# Patient Record
Sex: Female | Born: 1945
Health system: Southern US, Community
[De-identification: ages and names within clinical notes are randomized; demographics above are authoritative.]

## PROBLEM LIST (undated history)

## (undated) DIAGNOSIS — D069 Carcinoma in situ of cervix, unspecified: Secondary | ICD-10-CM

## (undated) DIAGNOSIS — E7800 Pure hypercholesterolemia, unspecified: Secondary | ICD-10-CM

## (undated) DIAGNOSIS — K219 Gastro-esophageal reflux disease without esophagitis: Secondary | ICD-10-CM

## (undated) DIAGNOSIS — N189 Chronic kidney disease, unspecified: Secondary | ICD-10-CM

## (undated) DIAGNOSIS — M1990 Unspecified osteoarthritis, unspecified site: Secondary | ICD-10-CM

## (undated) DIAGNOSIS — I10 Essential (primary) hypertension: Secondary | ICD-10-CM

## (undated) DIAGNOSIS — C50919 Malignant neoplasm of unspecified site of unspecified female breast: Secondary | ICD-10-CM

## (undated) HISTORY — PX: BREAST LUMPECTOMY: SHX2

## (undated) HISTORY — PX: MASTECTOMY: SHX3

## (undated) HISTORY — DX: Carcinoma in situ of cervix, unspecified: D06.9

## (undated) HISTORY — PX: VULVA /PERINEUM BIOPSY: SHX319

## (undated) HISTORY — PX: RENAL BIOPSY: SHX156

## (undated) HISTORY — PX: COLONOSCOPY: SHX174

## (undated) HISTORY — PX: POLYPECTOMY: SHX149

---

## 1997-09-22 ENCOUNTER — Ambulatory Visit (HOSPITAL_COMMUNITY): Admission: RE | Admit: 1997-09-22 | Discharge: 1997-09-22 | Payer: Self-pay | Admitting: Internal Medicine

## 2000-02-08 HISTORY — PX: MASTECTOMY: SHX3

## 2000-04-20 ENCOUNTER — Ambulatory Visit (HOSPITAL_COMMUNITY): Admission: RE | Admit: 2000-04-20 | Discharge: 2000-04-20 | Payer: Self-pay | Admitting: Internal Medicine

## 2000-04-20 ENCOUNTER — Encounter: Payer: Self-pay | Admitting: Internal Medicine

## 2000-04-27 ENCOUNTER — Encounter: Admission: RE | Admit: 2000-04-27 | Discharge: 2000-04-27 | Payer: Self-pay | Admitting: Internal Medicine

## 2000-04-27 ENCOUNTER — Other Ambulatory Visit: Admission: RE | Admit: 2000-04-27 | Discharge: 2000-04-27 | Payer: Self-pay | Admitting: Internal Medicine

## 2000-04-27 ENCOUNTER — Encounter: Payer: Self-pay | Admitting: Internal Medicine

## 2000-04-27 ENCOUNTER — Encounter (INDEPENDENT_AMBULATORY_CARE_PROVIDER_SITE_OTHER): Payer: Self-pay

## 2000-05-16 ENCOUNTER — Encounter (HOSPITAL_BASED_OUTPATIENT_CLINIC_OR_DEPARTMENT_OTHER): Payer: Self-pay | Admitting: General Surgery

## 2000-05-18 ENCOUNTER — Encounter (HOSPITAL_BASED_OUTPATIENT_CLINIC_OR_DEPARTMENT_OTHER): Payer: Self-pay | Admitting: General Surgery

## 2000-05-18 ENCOUNTER — Ambulatory Visit (HOSPITAL_COMMUNITY): Admission: RE | Admit: 2000-05-18 | Discharge: 2000-05-18 | Payer: Self-pay | Admitting: General Surgery

## 2000-05-18 ENCOUNTER — Encounter (INDEPENDENT_AMBULATORY_CARE_PROVIDER_SITE_OTHER): Payer: Self-pay | Admitting: *Deleted

## 2000-07-03 ENCOUNTER — Ambulatory Visit (HOSPITAL_COMMUNITY): Admission: RE | Admit: 2000-07-03 | Discharge: 2000-07-03 | Payer: Self-pay | Admitting: *Deleted

## 2000-07-03 ENCOUNTER — Encounter: Payer: Self-pay | Admitting: *Deleted

## 2000-07-11 ENCOUNTER — Encounter (HOSPITAL_BASED_OUTPATIENT_CLINIC_OR_DEPARTMENT_OTHER): Payer: Self-pay | Admitting: General Surgery

## 2000-07-11 ENCOUNTER — Encounter (INDEPENDENT_AMBULATORY_CARE_PROVIDER_SITE_OTHER): Payer: Self-pay | Admitting: Specialist

## 2000-07-11 ENCOUNTER — Ambulatory Visit (HOSPITAL_COMMUNITY): Admission: RE | Admit: 2000-07-11 | Discharge: 2000-07-12 | Payer: Self-pay | Admitting: General Surgery

## 2000-09-21 ENCOUNTER — Other Ambulatory Visit: Admission: RE | Admit: 2000-09-21 | Discharge: 2000-09-21 | Payer: Self-pay | Admitting: Internal Medicine

## 2000-11-17 ENCOUNTER — Ambulatory Visit (HOSPITAL_COMMUNITY): Admission: RE | Admit: 2000-11-17 | Discharge: 2000-11-17 | Payer: Self-pay | Admitting: General Surgery

## 2001-06-02 ENCOUNTER — Emergency Department (HOSPITAL_COMMUNITY): Admission: EM | Admit: 2001-06-02 | Discharge: 2001-06-02 | Payer: Self-pay | Admitting: Emergency Medicine

## 2001-09-11 ENCOUNTER — Other Ambulatory Visit: Admission: RE | Admit: 2001-09-11 | Discharge: 2001-09-11 | Payer: Self-pay | Admitting: Internal Medicine

## 2001-11-06 ENCOUNTER — Emergency Department (HOSPITAL_COMMUNITY): Admission: EM | Admit: 2001-11-06 | Discharge: 2001-11-06 | Payer: Self-pay | Admitting: Emergency Medicine

## 2001-12-19 ENCOUNTER — Encounter: Payer: Self-pay | Admitting: *Deleted

## 2001-12-19 ENCOUNTER — Ambulatory Visit (HOSPITAL_COMMUNITY): Admission: RE | Admit: 2001-12-19 | Discharge: 2001-12-19 | Payer: Self-pay | Admitting: *Deleted

## 2002-07-11 ENCOUNTER — Encounter: Admission: RE | Admit: 2002-07-11 | Discharge: 2002-07-11 | Payer: Self-pay | Admitting: *Deleted

## 2002-07-11 ENCOUNTER — Encounter: Payer: Self-pay | Admitting: *Deleted

## 2002-07-30 ENCOUNTER — Emergency Department (HOSPITAL_COMMUNITY): Admission: EM | Admit: 2002-07-30 | Discharge: 2002-07-30 | Payer: Self-pay | Admitting: Emergency Medicine

## 2003-01-21 ENCOUNTER — Other Ambulatory Visit: Admission: RE | Admit: 2003-01-21 | Discharge: 2003-01-21 | Payer: Self-pay | Admitting: Internal Medicine

## 2003-02-08 HISTORY — PX: HYSTEROSCOPY WITH D & C: SHX1775

## 2003-02-08 HISTORY — PX: CERVICAL BIOPSY  W/ LOOP ELECTRODE EXCISION: SUR135

## 2003-04-23 ENCOUNTER — Ambulatory Visit (HOSPITAL_COMMUNITY): Admission: RE | Admit: 2003-04-23 | Discharge: 2003-04-23 | Payer: Self-pay | Admitting: Obstetrics and Gynecology

## 2003-04-23 ENCOUNTER — Encounter (INDEPENDENT_AMBULATORY_CARE_PROVIDER_SITE_OTHER): Payer: Self-pay | Admitting: Specialist

## 2003-07-28 ENCOUNTER — Other Ambulatory Visit: Admission: RE | Admit: 2003-07-28 | Discharge: 2003-07-28 | Payer: Self-pay | Admitting: Obstetrics and Gynecology

## 2003-12-30 ENCOUNTER — Ambulatory Visit: Payer: Self-pay | Admitting: Oncology

## 2004-01-03 ENCOUNTER — Emergency Department (HOSPITAL_COMMUNITY): Admission: EM | Admit: 2004-01-03 | Discharge: 2004-01-03 | Payer: Self-pay | Admitting: Family Medicine

## 2004-04-16 ENCOUNTER — Emergency Department (HOSPITAL_COMMUNITY): Admission: EM | Admit: 2004-04-16 | Discharge: 2004-04-16 | Payer: Self-pay | Admitting: Family Medicine

## 2004-06-12 ENCOUNTER — Emergency Department (HOSPITAL_COMMUNITY): Admission: EM | Admit: 2004-06-12 | Discharge: 2004-06-12 | Payer: Self-pay | Admitting: Family Medicine

## 2004-07-07 ENCOUNTER — Ambulatory Visit: Payer: Self-pay | Admitting: Oncology

## 2004-08-06 ENCOUNTER — Encounter: Admission: RE | Admit: 2004-08-06 | Discharge: 2004-08-06 | Payer: Self-pay | Admitting: Oncology

## 2004-08-28 ENCOUNTER — Emergency Department (HOSPITAL_COMMUNITY): Admission: EM | Admit: 2004-08-28 | Discharge: 2004-08-28 | Payer: Self-pay | Admitting: Family Medicine

## 2004-10-16 ENCOUNTER — Inpatient Hospital Stay (HOSPITAL_COMMUNITY): Admission: EM | Admit: 2004-10-16 | Discharge: 2004-10-19 | Payer: Self-pay | Admitting: Emergency Medicine

## 2005-01-06 ENCOUNTER — Ambulatory Visit: Payer: Self-pay | Admitting: Oncology

## 2005-02-04 ENCOUNTER — Encounter: Admission: RE | Admit: 2005-02-04 | Discharge: 2005-02-04 | Payer: Self-pay | Admitting: Nephrology

## 2005-03-07 ENCOUNTER — Ambulatory Visit: Payer: Self-pay | Admitting: Oncology

## 2005-04-21 ENCOUNTER — Ambulatory Visit (HOSPITAL_COMMUNITY): Admission: RE | Admit: 2005-04-21 | Discharge: 2005-04-21 | Payer: Self-pay | Admitting: Nephrology

## 2005-05-11 ENCOUNTER — Ambulatory Visit (HOSPITAL_COMMUNITY): Admission: RE | Admit: 2005-05-11 | Discharge: 2005-05-12 | Payer: Self-pay | Admitting: Nephrology

## 2005-05-11 ENCOUNTER — Encounter (INDEPENDENT_AMBULATORY_CARE_PROVIDER_SITE_OTHER): Payer: Self-pay | Admitting: *Deleted

## 2005-05-27 ENCOUNTER — Emergency Department (HOSPITAL_COMMUNITY): Admission: EM | Admit: 2005-05-27 | Discharge: 2005-05-27 | Payer: Self-pay | Admitting: Family Medicine

## 2005-05-31 ENCOUNTER — Other Ambulatory Visit: Admission: RE | Admit: 2005-05-31 | Discharge: 2005-05-31 | Payer: Self-pay | Admitting: Obstetrics and Gynecology

## 2005-06-20 ENCOUNTER — Ambulatory Visit (HOSPITAL_COMMUNITY): Admission: RE | Admit: 2005-06-20 | Discharge: 2005-06-20 | Payer: Self-pay | Admitting: Oncology

## 2005-08-24 ENCOUNTER — Ambulatory Visit: Payer: Self-pay | Admitting: Oncology

## 2005-08-29 LAB — PROTEIN / CREATININE RATIO, URINE
Creatinine, Urine: 89.4 mg/dL
Protein Creatinine Ratio: 2.14 — ABNORMAL HIGH (ref <0.15)
Total Protein, Urine: 191 mg/dL

## 2005-08-29 LAB — COMPREHENSIVE METABOLIC PANEL
ALT: 9 U/L (ref 0–40)
AST: 15 U/L (ref 0–37)
Albumin: 3.9 g/dL (ref 3.5–5.2)
CO2: 23 mEq/L (ref 19–32)
Calcium: 9.6 mg/dL (ref 8.4–10.5)
Chloride: 106 mEq/L (ref 96–112)
Potassium: 4.5 mEq/L (ref 3.5–5.3)
Sodium: 141 mEq/L (ref 135–145)
Total Protein: 6.3 g/dL (ref 6.0–8.3)

## 2005-08-29 LAB — CBC WITH DIFFERENTIAL/PLATELET
Eosinophils Absolute: 0.1 10*3/uL (ref 0.0–0.5)
LYMPH%: 30 % (ref 14.0–48.0)
MONO#: 0.4 10*3/uL (ref 0.1–0.9)
NEUT#: 3 10*3/uL (ref 1.5–6.5)
Platelets: 301 10*3/uL (ref 145–400)
RBC: 4.58 10*6/uL (ref 3.70–5.32)
RDW: 14.2 % (ref 11.3–14.5)
WBC: 5 10*3/uL (ref 3.9–10.0)
lymph#: 1.5 10*3/uL (ref 0.9–3.3)

## 2005-08-29 LAB — ERYTHROCYTE SEDIMENTATION RATE: Sed Rate: 28 mm/hr (ref 0–30)

## 2005-08-29 LAB — LACTATE DEHYDROGENASE: LDH: 160 U/L (ref 94–250)

## 2005-08-29 LAB — URIC ACID: Uric Acid, Serum: 6.8 mg/dL (ref 2.4–7.0)

## 2005-09-23 ENCOUNTER — Encounter: Admission: RE | Admit: 2005-09-23 | Discharge: 2005-09-23 | Payer: Self-pay | Admitting: Oncology

## 2005-09-23 LAB — LIPID PANEL
LDL Cholesterol: 176 mg/dL — ABNORMAL HIGH (ref 0–99)
Total CHOL/HDL Ratio: 5.2 Ratio

## 2006-02-27 ENCOUNTER — Ambulatory Visit: Payer: Self-pay | Admitting: Oncology

## 2006-03-02 LAB — CBC WITH DIFFERENTIAL/PLATELET
BASO%: 2.8 % — ABNORMAL HIGH (ref 0.0–2.0)
EOS%: 1 % (ref 0.0–7.0)
MCH: 29.8 pg (ref 26.0–34.0)
MCHC: 33.8 g/dL (ref 32.0–36.0)
RDW: 13.4 % (ref 11.3–14.5)
lymph#: 2.1 10*3/uL (ref 0.9–3.3)

## 2006-03-02 LAB — COMPREHENSIVE METABOLIC PANEL
ALT: 8 U/L (ref 0–35)
Albumin: 3.6 g/dL (ref 3.5–5.2)
CO2: 25 mEq/L (ref 19–32)
Calcium: 9 mg/dL (ref 8.4–10.5)
Chloride: 106 mEq/L (ref 96–112)
Glucose, Bld: 96 mg/dL (ref 70–99)
Potassium: 4.3 mEq/L (ref 3.5–5.3)
Sodium: 140 mEq/L (ref 135–145)
Total Bilirubin: 0.3 mg/dL (ref 0.3–1.2)
Total Protein: 5.9 g/dL — ABNORMAL LOW (ref 6.0–8.3)

## 2006-03-02 LAB — LACTATE DEHYDROGENASE: LDH: 154 U/L (ref 94–250)

## 2006-03-02 LAB — CANCER ANTIGEN 27.29: CA 27.29: 9 U/mL (ref 0–39)

## 2006-05-02 ENCOUNTER — Ambulatory Visit: Payer: Self-pay | Admitting: Oncology

## 2006-05-02 LAB — CBC WITH DIFFERENTIAL/PLATELET
BASO%: 0.7 % (ref 0.0–2.0)
Basophils Absolute: 0 10*3/uL (ref 0.0–0.1)
EOS%: 1 % (ref 0.0–7.0)
HGB: 11.5 g/dL — ABNORMAL LOW (ref 11.6–15.9)
MCH: 29.4 pg (ref 26.0–34.0)
MCHC: 34.3 g/dL (ref 32.0–36.0)
MCV: 85.8 fL (ref 81.0–101.0)
MONO%: 7.2 % (ref 0.0–13.0)
RDW: 13.2 % (ref 11.3–14.5)

## 2006-05-02 LAB — MORPHOLOGY

## 2006-08-03 ENCOUNTER — Emergency Department (HOSPITAL_COMMUNITY): Admission: EM | Admit: 2006-08-03 | Discharge: 2006-08-03 | Payer: Self-pay | Admitting: Emergency Medicine

## 2006-08-28 ENCOUNTER — Ambulatory Visit: Payer: Self-pay | Admitting: Oncology

## 2006-08-31 LAB — CBC WITH DIFFERENTIAL/PLATELET
Basophils Absolute: 0 10*3/uL (ref 0.0–0.1)
EOS%: 1.3 % (ref 0.0–7.0)
HCT: 33.7 % — ABNORMAL LOW (ref 34.8–46.6)
HGB: 11.8 g/dL (ref 11.6–15.9)
MCH: 30.1 pg (ref 26.0–34.0)
MCHC: 35.1 g/dL (ref 32.0–36.0)
MCV: 86 fL (ref 81.0–101.0)
MONO%: 7 % (ref 0.0–13.0)
NEUT%: 62.2 % (ref 39.6–76.8)

## 2006-08-31 LAB — COMPREHENSIVE METABOLIC PANEL
AST: 23 U/L (ref 0–37)
Alkaline Phosphatase: 96 U/L (ref 39–117)
BUN: 27 mg/dL — ABNORMAL HIGH (ref 6–23)
Calcium: 9.4 mg/dL (ref 8.4–10.5)
Creatinine, Ser: 2.16 mg/dL — ABNORMAL HIGH (ref 0.40–1.20)
Glucose, Bld: 110 mg/dL — ABNORMAL HIGH (ref 70–99)

## 2006-08-31 LAB — CANCER ANTIGEN 27.29: CA 27.29: 9 U/mL (ref 0–39)

## 2006-09-11 ENCOUNTER — Encounter: Admission: RE | Admit: 2006-09-11 | Discharge: 2006-09-11 | Payer: Self-pay | Admitting: Oncology

## 2006-09-11 LAB — COMPREHENSIVE METABOLIC PANEL
ALT: 11 U/L (ref 0–35)
Albumin: 3.8 g/dL (ref 3.5–5.2)
CO2: 21 mEq/L (ref 19–32)
Calcium: 9.8 mg/dL (ref 8.4–10.5)
Chloride: 109 mEq/L (ref 96–112)
Glucose, Bld: 110 mg/dL — ABNORMAL HIGH (ref 70–99)
Potassium: 4.6 mEq/L (ref 3.5–5.3)
Sodium: 143 mEq/L (ref 135–145)
Total Bilirubin: 0.4 mg/dL (ref 0.3–1.2)
Total Protein: 6.1 g/dL (ref 6.0–8.3)

## 2006-09-11 LAB — LIPID PANEL
Cholesterol: 235 mg/dL — ABNORMAL HIGH (ref 0–200)
VLDL: 40 mg/dL (ref 0–40)

## 2006-09-11 LAB — CBC WITH DIFFERENTIAL/PLATELET
BASO%: 0.5 % (ref 0.0–2.0)
Eosinophils Absolute: 0.1 10*3/uL (ref 0.0–0.5)
LYMPH%: 25.3 % (ref 14.0–48.0)
MONO#: 0.4 10*3/uL (ref 0.1–0.9)
NEUT#: 3.8 10*3/uL (ref 1.5–6.5)
Platelets: 285 10*3/uL (ref 145–400)
RBC: 4.14 10*6/uL (ref 3.70–5.32)
WBC: 5.8 10*3/uL (ref 3.9–10.0)
lymph#: 1.5 10*3/uL (ref 0.9–3.3)

## 2006-09-11 LAB — LACTATE DEHYDROGENASE: LDH: 177 U/L (ref 94–250)

## 2006-11-21 ENCOUNTER — Ambulatory Visit: Payer: Self-pay | Admitting: Oncology

## 2007-01-01 IMAGING — CT CT ABDOMEN W/O CM
1 of 2 series · 14 of 32 positions shown, 18 images · IV contrast (agent unspecified)
Comparison: none

CLINICAL DATA: Bilateral breast cancer diagnosed in 0660.  Chemotherapy completed in 0660.
CHEST CT WITHOUT CONTRAST:
TECHNIQUE: Multidetector CT imaging of the chest was performed following the standard protocol without IV contrast.
No prior CTs.
TECHNIQUE: Multidetector CT imaging of the abdomen was performed following the standard protocol without IV contrast.
TECHNIQUE: Multidetector CT imaging of the pelvis was performed following the standard protocol without IV contrast.

[Series 2: cap 5.0 b30f · axial · 0.68mm/px · z∈[-508,+62]mm · 14 of 127 slices shown, 18 images]
[im 7/127  soft-tissue]
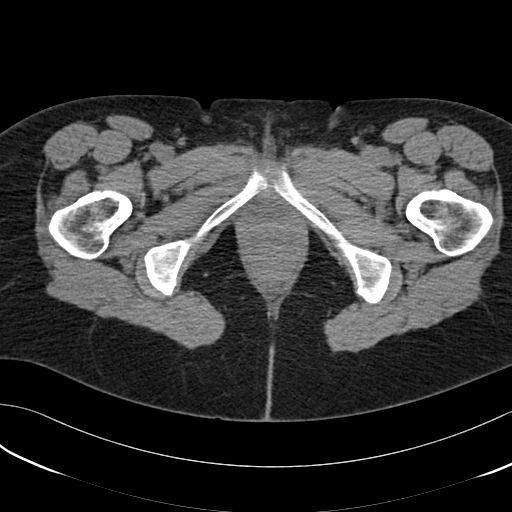
[im 7/127  bone]
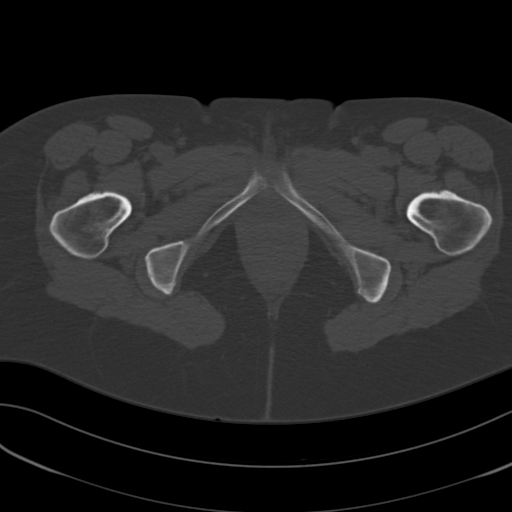
[im 19/127  soft-tissue]
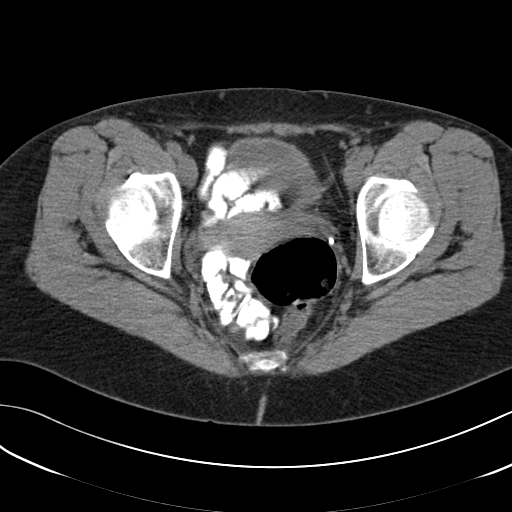
[im 31/127  soft-tissue]
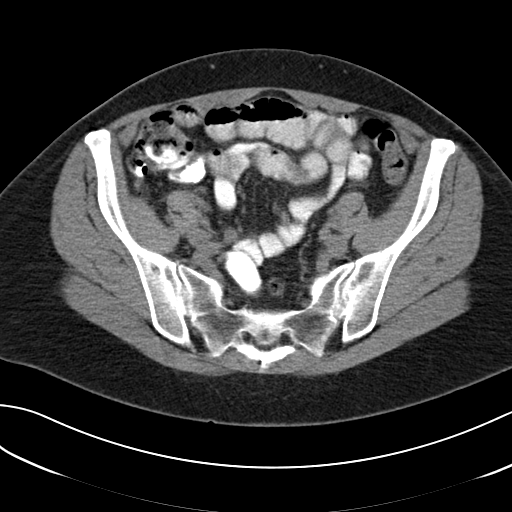
[im 37/127  soft-tissue]
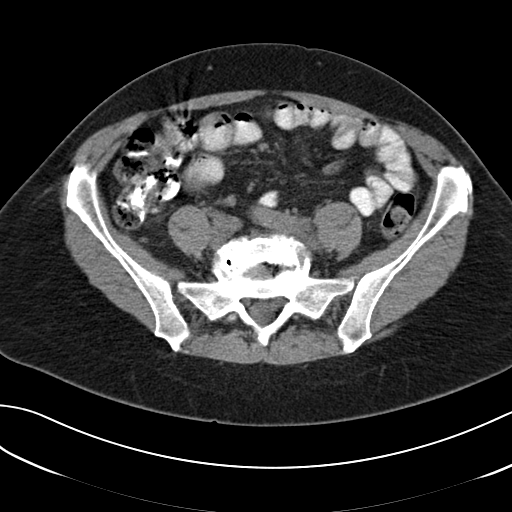
[im 49/127  soft-tissue]
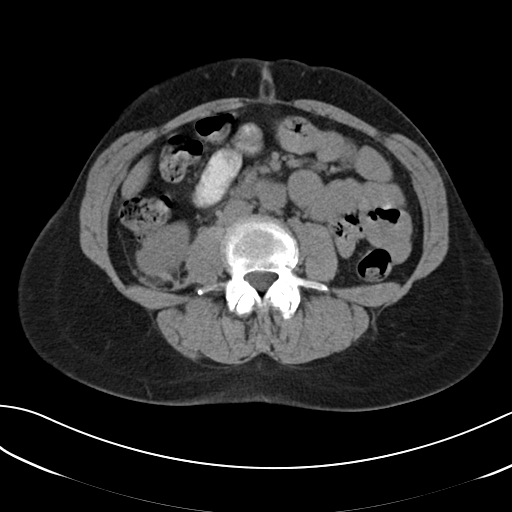
[im 61/127  soft-tissue]
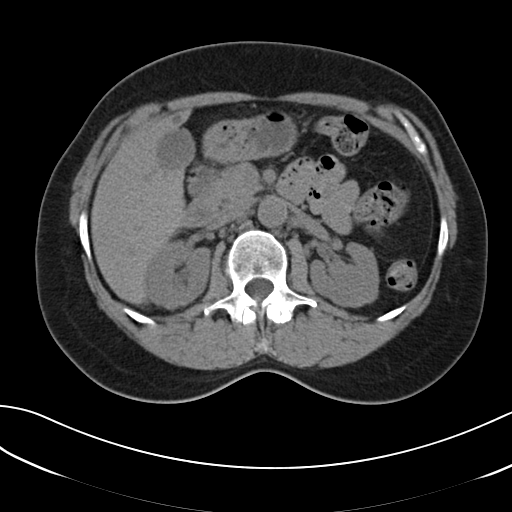
[im 67/127  soft-tissue]
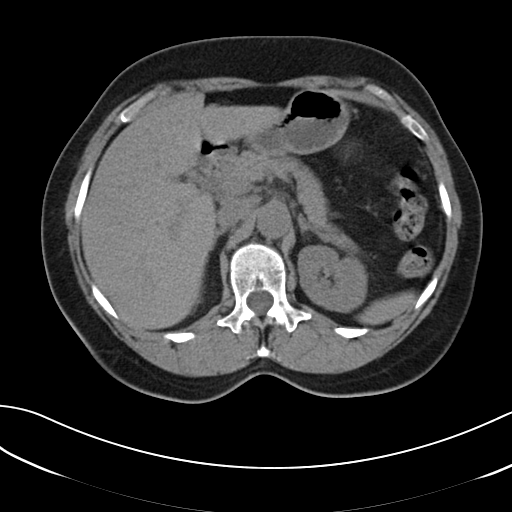
[im 79/127  soft-tissue]
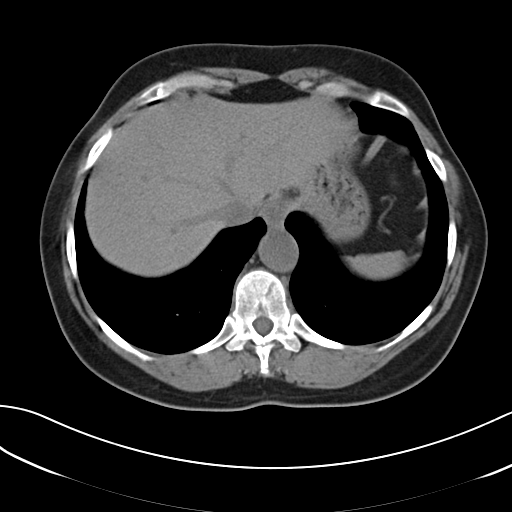
[im 91/127  soft-tissue]
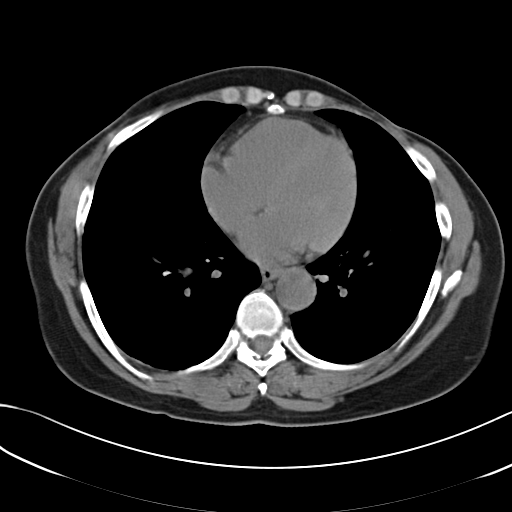
[im 91/127  bone]
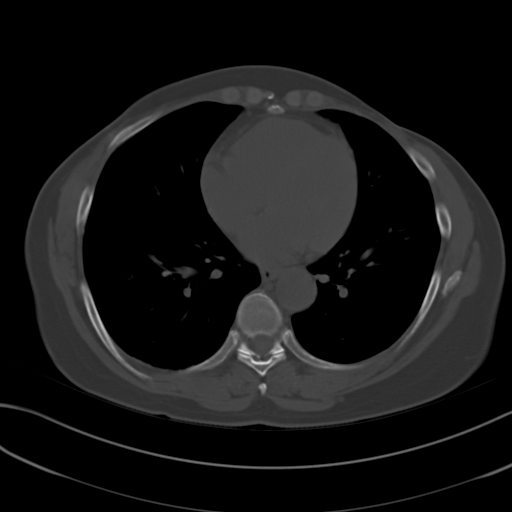
[im 97/127  soft-tissue]
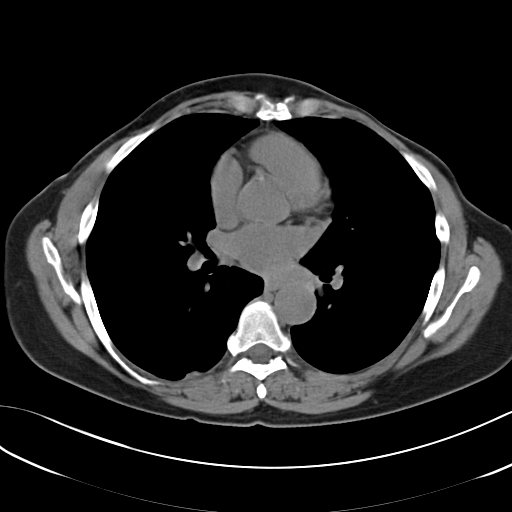
[im 103/127  lung]
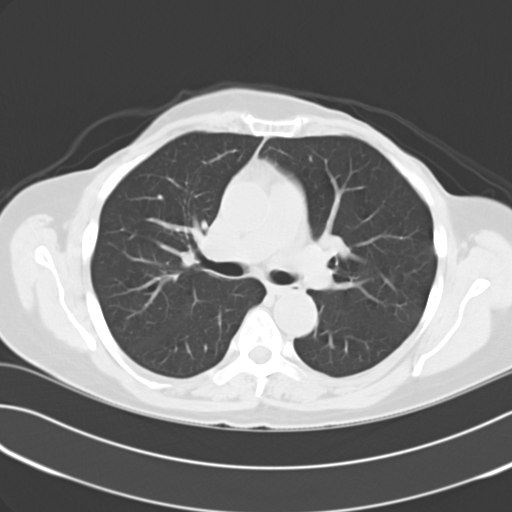
[im 109/127  soft-tissue]
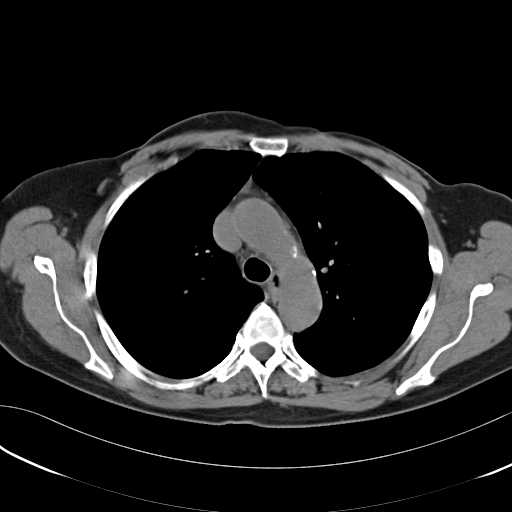
[im 109/127  lung]
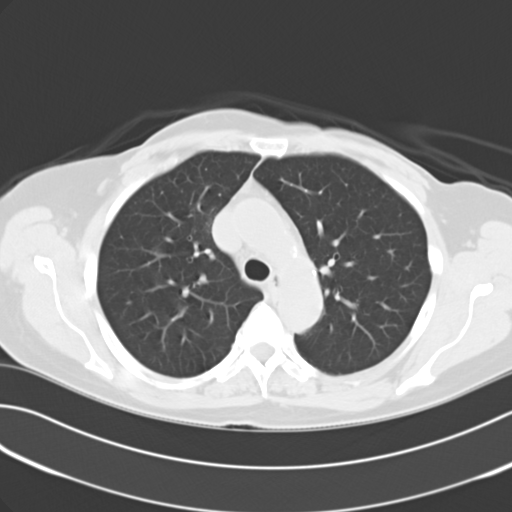
[im 115/127  lung]
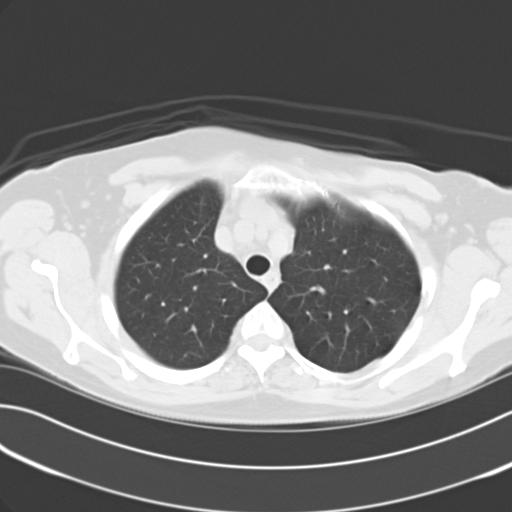
[im 121/127  soft-tissue]
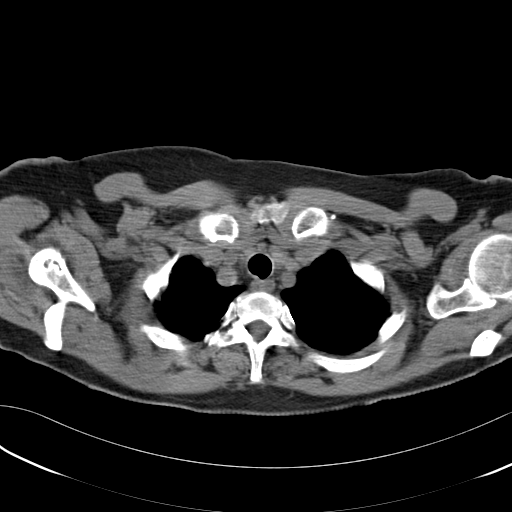
[im 121/127  lung]
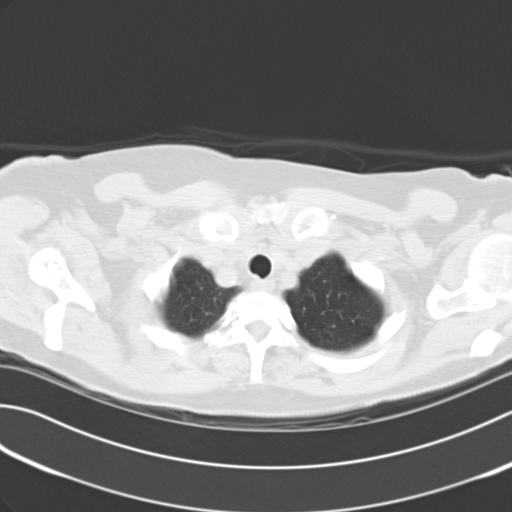

[14 of 32 positions shown; findings below may reference images not displayed]

FINDINGS: Lung windows demonstrates minimal pleural thickening posteriorly on the right.  No nodules.  No airspace disease.  
Soft tissue windows demonstrate postsurgical changes of bilateral mastectomy.  No axillary or internal mammary adenopathy.  Heart size is normal without pericardial or pleural effusion.  No definite mediastinal or hilar adenopathy (given limitations of unenhanced CT).
IMPRESSION: Given limitations of unenhanced CT, no acute process or evidence of metastatic disease within the chest.  
ABDOMEN CT WITHOUT CONTRAST:
FINDINGS: Low-density lesion adjacent to the gallbladder likely represents a small hepatic cyst.  There is a left hepatic dome low-density lesion, which also likely represents a tiny cyst.  Splenule.  Stomach, pancreas, gallbladder, adrenal glands, and left kidney are normal.  There is increased density involving the perirenal fat adjacent to the lower pole right kidney, as well as thickening of the posterior renal fascia in the region (image 81).  This is of indeterminate etiology.  Correlate with prior instrumentation or surgery.  There is an 8 mm retroperitoneal lymph node posterior to the aortic bifurcation.  There are other small retroperitoneal lymph nodes identified.  Small jejunal mesenteric lymph nodes are likely reactive.  The large and small bowel are otherwise normal and there is no ascites.
IMPRESSION: 1.  No evidence of metastatic disease within the abdomen.
2.  Small retroperitoneal lymph nodes are likely reactive but technically indeterminate.  None are pathologic by CT size criteria.  
3.  Prior biopsy or instrumentation to the lower pole right kidney with fascial thickening as described.  
4.  Probable hepatic cysts, limited by lack of intravenous contrast.
PELVIS CT WITHOUT CONTRAST:
FINDINGS: Pelvic large and small bowel are normal.  Urinary bladder and uterus normal for age.  No adnexal mass or significant ascites.  Bone windows demonstrate mild osteopenia.  Likely degenerative cysts at bilateral glenoids.  No worrisome osseous lesion.
IMPRESSION: No acute process in the pelvis or evidence of metastatic disease.

## 2007-02-24 ENCOUNTER — Emergency Department (HOSPITAL_COMMUNITY): Admission: EM | Admit: 2007-02-24 | Discharge: 2007-02-24 | Payer: Self-pay | Admitting: Emergency Medicine

## 2007-02-27 ENCOUNTER — Ambulatory Visit: Payer: Self-pay | Admitting: Oncology

## 2007-03-01 LAB — COMPREHENSIVE METABOLIC PANEL
Albumin: 3.9 g/dL (ref 3.5–5.2)
Alkaline Phosphatase: 126 U/L — ABNORMAL HIGH (ref 39–117)
BUN: 33 mg/dL — ABNORMAL HIGH (ref 6–23)
Calcium: 8.9 mg/dL (ref 8.4–10.5)
Chloride: 108 mEq/L (ref 96–112)
Glucose, Bld: 85 mg/dL (ref 70–99)
Potassium: 4.5 mEq/L (ref 3.5–5.3)
Sodium: 141 mEq/L (ref 135–145)
Total Protein: 6.3 g/dL (ref 6.0–8.3)

## 2007-03-01 LAB — LIPID PANEL
Cholesterol: 237 mg/dL — ABNORMAL HIGH (ref 0–200)
HDL: 58 mg/dL (ref >39)
LDL Cholesterol: 143 mg/dL — ABNORMAL HIGH (ref 0–99)
Triglycerides: 181 mg/dL — ABNORMAL HIGH (ref <150)

## 2007-03-01 LAB — CBC WITH DIFFERENTIAL/PLATELET
BASO%: 0.7 % (ref 0.0–2.0)
EOS%: 1.5 % (ref 0.0–7.0)
HCT: 34.1 % — ABNORMAL LOW (ref 34.8–46.6)
LYMPH%: 29.5 % (ref 14.0–48.0)
MCH: 28.9 pg (ref 26.0–34.0)
MCHC: 33.7 g/dL (ref 32.0–36.0)
MCV: 85.8 fL (ref 81.0–101.0)
MONO%: 6.2 % (ref 0.0–13.0)
NEUT%: 62.1 % (ref 39.6–76.8)
lymph#: 2.2 10*3/uL (ref 0.9–3.3)

## 2007-03-12 ENCOUNTER — Ambulatory Visit (HOSPITAL_COMMUNITY): Admission: RE | Admit: 2007-03-12 | Discharge: 2007-03-12 | Payer: Self-pay | Admitting: Oncology

## 2007-03-14 LAB — VITAMIN D PNL(25-HYDRXY+1,25-DIHY)-BLD
Vit D, 1,25-Dihydroxy: 15 pg/mL (ref 6–62)
Vit D, 25-Hydroxy: 14 ng/mL — ABNORMAL LOW (ref 30–89)

## 2007-07-05 ENCOUNTER — Emergency Department (HOSPITAL_COMMUNITY): Admission: EM | Admit: 2007-07-05 | Discharge: 2007-07-05 | Payer: Self-pay | Admitting: Family Medicine

## 2007-08-27 ENCOUNTER — Ambulatory Visit: Payer: Self-pay | Admitting: Oncology

## 2007-08-30 LAB — CBC WITH DIFFERENTIAL/PLATELET
BASO%: 0.8 % (ref 0.0–2.0)
Eosinophils Absolute: 0.1 10*3/uL (ref 0.0–0.5)
HCT: 36.2 % (ref 34.8–46.6)
MCHC: 34.2 g/dL (ref 32.0–36.0)
MONO#: 0.4 10*3/uL (ref 0.1–0.9)
NEUT#: 4.3 10*3/uL (ref 1.5–6.5)
NEUT%: 63.1 % (ref 39.6–76.8)
RBC: 4.19 10*6/uL (ref 3.70–5.32)
WBC: 6.8 10*3/uL (ref 3.9–10.0)
lymph#: 1.9 10*3/uL (ref 0.9–3.3)

## 2007-08-31 LAB — LIPID PANEL
Cholesterol: 240 mg/dL — ABNORMAL HIGH (ref 0–200)
HDL: 50 mg/dL (ref >39)
Total CHOL/HDL Ratio: 4.8 Ratio

## 2007-08-31 LAB — COMPREHENSIVE METABOLIC PANEL
ALT: 11 U/L (ref 0–35)
AST: 14 U/L (ref 0–37)
Calcium: 9.8 mg/dL (ref 8.4–10.5)
Chloride: 108 mEq/L (ref 96–112)
Creatinine, Ser: 2.1 mg/dL — ABNORMAL HIGH (ref 0.40–1.20)
Potassium: 4.8 mEq/L (ref 3.5–5.3)

## 2007-08-31 LAB — LACTATE DEHYDROGENASE: LDH: 158 U/L (ref 94–250)

## 2007-09-13 ENCOUNTER — Encounter: Admission: RE | Admit: 2007-09-13 | Discharge: 2007-09-13 | Payer: Self-pay | Admitting: Oncology

## 2007-11-23 ENCOUNTER — Ambulatory Visit: Payer: Self-pay | Admitting: Oncology

## 2008-02-22 ENCOUNTER — Ambulatory Visit: Payer: Self-pay | Admitting: Oncology

## 2008-09-01 ENCOUNTER — Ambulatory Visit: Payer: Self-pay | Admitting: Oncology

## 2008-09-04 LAB — CBC WITH DIFFERENTIAL/PLATELET
HCT: 35.9 % (ref 34.8–46.6)
HGB: 12.3 g/dL (ref 11.6–15.9)
MCH: 29.6 pg (ref 25.1–34.0)
MCHC: 34.1 g/dL (ref 31.5–36.0)
MCV: 86.6 fL (ref 79.5–101.0)
MONO%: 6.5 % (ref 0.0–14.0)
NEUT#: 4.3 10*3/uL (ref 1.5–6.5)
Platelets: 267 10*3/uL (ref 145–400)
RBC: 4.15 10*6/uL (ref 3.70–5.45)
RDW: 14.1 % (ref 11.2–14.5)
lymph#: 1.4 10*3/uL (ref 0.9–3.3)

## 2008-09-05 LAB — COMPREHENSIVE METABOLIC PANEL
BUN: 25 mg/dL — ABNORMAL HIGH (ref 6–23)
Calcium: 10.6 mg/dL — ABNORMAL HIGH (ref 8.4–10.5)
Chloride: 107 mEq/L (ref 96–112)
Creatinine, Ser: 1.67 mg/dL — ABNORMAL HIGH (ref 0.40–1.20)
Glucose, Bld: 98 mg/dL (ref 70–99)
Sodium: 140 mEq/L (ref 135–145)

## 2008-09-05 LAB — VITAMIN D 25 HYDROXY (VIT D DEFICIENCY, FRACTURES): Vit D, 25-Hydroxy: 41 ng/mL (ref 30–89)

## 2008-09-15 ENCOUNTER — Encounter: Admission: RE | Admit: 2008-09-15 | Discharge: 2008-09-15 | Payer: Self-pay | Admitting: Oncology

## 2008-12-01 ENCOUNTER — Ambulatory Visit: Payer: Self-pay | Admitting: Oncology

## 2008-12-04 LAB — CBC WITH DIFFERENTIAL/PLATELET
BASO%: 0.6 % (ref 0.0–2.0)
Eosinophils Absolute: 0.1 10*3/uL (ref 0.0–0.5)
HCT: 34.5 % — ABNORMAL LOW (ref 34.8–46.6)
HGB: 11.7 g/dL (ref 11.6–15.9)
LYMPH%: 23.2 % (ref 14.0–49.7)
MCH: 29.9 pg (ref 25.1–34.0)
MCHC: 33.9 g/dL (ref 31.5–36.0)

## 2008-12-05 LAB — COMPREHENSIVE METABOLIC PANEL
ALT: 9 U/L (ref 0–35)
Albumin: 4.1 g/dL (ref 3.5–5.2)
Alkaline Phosphatase: 96 U/L (ref 39–117)
BUN: 32 mg/dL — ABNORMAL HIGH (ref 6–23)
CO2: 22 mEq/L (ref 19–32)
Calcium: 9.9 mg/dL (ref 8.4–10.5)
Chloride: 108 mEq/L (ref 96–112)

## 2008-12-05 LAB — VITAMIN D 25 HYDROXY (VIT D DEFICIENCY, FRACTURES): Vit D, 25-Hydroxy: 33 ng/mL (ref 30–89)

## 2008-12-05 LAB — LACTATE DEHYDROGENASE: LDH: 155 U/L (ref 94–250)

## 2009-04-30 ENCOUNTER — Emergency Department (HOSPITAL_COMMUNITY)
Admission: EM | Admit: 2009-04-30 | Discharge: 2009-04-30 | Payer: Self-pay | Source: Home / Self Care | Admitting: Emergency Medicine

## 2009-07-18 ENCOUNTER — Emergency Department (HOSPITAL_COMMUNITY)
Admission: EM | Admit: 2009-07-18 | Discharge: 2009-07-18 | Payer: Self-pay | Source: Home / Self Care | Admitting: Family Medicine

## 2009-10-30 ENCOUNTER — Ambulatory Visit: Payer: Self-pay | Admitting: Oncology

## 2009-11-03 LAB — COMPREHENSIVE METABOLIC PANEL
AST: 22 U/L (ref 0–37)
Albumin: 3.9 g/dL (ref 3.5–5.2)
Alkaline Phosphatase: 103 U/L (ref 39–117)
CO2: 28 mEq/L (ref 19–32)
Calcium: 9.9 mg/dL (ref 8.4–10.5)
Chloride: 109 mEq/L (ref 96–112)
Creatinine, Ser: 2.19 mg/dL — ABNORMAL HIGH (ref 0.40–1.20)
Glucose, Bld: 98 mg/dL (ref 70–99)
Potassium: 3.8 mEq/L (ref 3.5–5.3)
Total Protein: 7 g/dL (ref 6.0–8.3)

## 2009-11-03 LAB — CBC WITH DIFFERENTIAL/PLATELET
BASO%: 1.8 % (ref 0.0–2.0)
EOS%: 1.8 % (ref 0.0–7.0)
HGB: 11.5 g/dL — ABNORMAL LOW (ref 11.6–15.9)
MCH: 29.8 pg (ref 25.1–34.0)
MCV: 87.5 fL (ref 79.5–101.0)
MONO%: 7.7 % (ref 0.0–14.0)
NEUT#: 4.4 10*3/uL (ref 1.5–6.5)
Platelets: 260 10*3/uL (ref 145–400)
RBC: 3.88 10*6/uL (ref 3.70–5.45)
RDW: 13.9 % (ref 11.2–14.5)

## 2009-11-03 LAB — LACTATE DEHYDROGENASE: LDH: 151 U/L (ref 94–250)

## 2009-12-08 ENCOUNTER — Emergency Department (HOSPITAL_COMMUNITY)
Admission: EM | Admit: 2009-12-08 | Discharge: 2009-12-08 | Payer: Self-pay | Source: Home / Self Care | Admitting: Emergency Medicine

## 2010-02-28 ENCOUNTER — Encounter: Payer: Self-pay | Admitting: Oncology

## 2010-04-20 LAB — WOUND CULTURE

## 2010-05-03 ENCOUNTER — Other Ambulatory Visit: Payer: Self-pay | Admitting: Oncology

## 2010-05-03 DIAGNOSIS — M8580 Other specified disorders of bone density and structure, unspecified site: Secondary | ICD-10-CM

## 2010-06-25 NOTE — Op Note (Signed)
NAME:  Karen Myers, Karen Myers                      ACCOUNT NO.:  [id number]   MEDICAL RECORD NO.:  [id number]                   PATIENT TYPE:  AMB   LOCATION:  [place]                                  FACILITY:  [facility]   PHYSICIAN:  Naima A. Dillard, M.D.              DATE OF BIRTH:  [birth year]   DATE OF PROCEDURE:  04/23/2003  DATE OF DISCHARGE:                                 OPERATIVE REPORT   PREOPERATIVE DIAGNOSIS:  She had an atypical glandular cell abnormality on  Papanicolaou smear and carcinoma in situ on colposcopy.   POSTOPERATIVE DIAGNOSIS:  She had an atypical glandular cell abnormality on  Papanicolaou smear and carcinoma in situ on colposcopy.   PROCEDURE:  Dilatation and curettage, hysteroscopy, and loop electrosurgical  excision procedure.   SURGEON:  Naima A. Charlesetta Garibaldi, M.D.   ANESTHESIA:  General laryngeal mask airway.   ESTIMATED BLOOD LOSS:  Minimal.   FLUIDS REPLACED:  600 mL.   COMPLICATIONS:  None.   FINDINGS:  Atrophic endometrium.  No polyps, fibroids, or abnormalities seen  in the endometrium.  Both ostia were visualized.  A normal-appearing cervix  also.  A LEEP was done and a suture was placed at 12 o'clock.   PROCEDURE IN DETAIL:  The patient went to the operating room, was given  general anesthesia with laryngeal mask airway, and placed in the dorsal  lithotomy position and prepped and draped in a normal sterile fashion.  Bladder was drained.  The patient was examined and found to have anteverted  uterus with no adnexal masses.  Vulvovaginal exam was found to be normal.  A  bivalve speculum was placed into the vagina.  The anterior lip of the cervix  was grasped with a single-tooth tenaculum and dilated with Pratt dilators up  to 21.  The hysteroscope was placed into the uterine cavity.  There was just  atrophic endometrium seen.  Both ostia were visualized and no abnormalities  were seen.  The hysteroscope was removed and endocervical curettage was  done.  Endometrial curetting was done, and endometrial curettings and  endocervical curettings were sent off to pathology.  All instruments were  removed from the vagina.  A coated weighted speculum was then placed into  the vagina and the anterior lip of the cervix was again grasped with a  single-tooth tenaculum.  Lidocaine 10% 1 mL was used for a cervical block  before the hysteroscopy was begun.  Lugol's solution was placed on the  cervix and there were several areas that were highlighted with Lugol's.  The  LEEP was done without difficulty.  The cervical bed was made  hemostatic with Bovie cautery and Monsel's solution.  All instruments were  removed from the vagina.  The tenaculum site was removed with good  hemostasis noted.  Sponge, lap, and needle counts were correct x2.  The  patient tolerated the procedure well.  Naima A. Charlesetta Garibaldi, M.D.    NAD/MEDQ  D:  04/23/2003  T:  04/24/2003  Job:  [id number]

## 2010-06-25 NOTE — H&P (Signed)
NAME:  Karen Myers, Karen Myers                      ACCOUNT NO.:  [id number]   MEDICAL RECORD NO.:  [id number]                   PATIENT TYPE:  AMB   LOCATION:  [place]                                  FACILITY:  [facility]   PHYSICIAN:  Naima A. Dillard, M.D.              DATE OF BIRTH:  [birth year]   DATE OF ADMISSION:  04/22/2003  DATE OF DISCHARGE:                                HISTORY & PHYSICAL   CHIEF COMPLAINT:  Carcinoma in situ.   HISTORY OF PRESENT ILLNESS:  The patient is a 65 year old African-American  female, gravida 2, para 2, who presented to me on February 28, 2003 for  colposcopy secondary to atypical endometrial glandular cell abnormality  present on her Pap smear.  The patient denied having any abnormal Pap smear  or colposcopy before.   PAST MEDICAL HISTORY:  1. Hypertension.  2. Cholesterol.  3. Breast cancer.   OBSTETRICAL HISTORY:  1. Vaginal delivery x2 without any complications.  2. The patient's LMP was years ago.  She is currently menopausal.   GYNECOLOGICAL HISTORY:  She denies having any history of sexually  transmitted diseases.  This was her first abnormal Pap smear and had a  history of an endocervical polyp.   FAMILY HISTORY:  Unremarkable.  No history of GYN cancer, diabetes,  hypertension, or breast cancer.   PAST SURGICAL HISTORY:  Significant for bilateral mastectomy for breast  cancer.  She is currently a two-year survivor and taking Arimidex.  She has  been menopausal for three years.   ALLERGIES:  NO KNOWN DRUG ALLERGIES.   MEDICATIONS:  Cardia, Lisinopril, Lipitor, Arimidex and Os-Cal.   REVIEW OF SYSTEMS:  GENITOURINARY:  Significant for breast cancer and  carcinoma in situ.  Also atypical glandular cell abnormalities found on Pap  smear.  ENDOCRINE:  Significant for hypercholesterolemia.  PSYCHIATRIC:  Unremarkable.  MUSCULOSKELETAL:  Unremarkable.  CARDIOVASCULAR:  Significant  for hypertension.  GI:  Unremarkable.   PHYSICAL EXAMINATION:   VITAL SIGNS:  Weight 163 pounds, blood pressure  128/80.  HEENT:  Pupils are equal.  Hearing is normal.  Throat is clear.  NECK:  Thyroid is not enlarged.  HEART:  Regular rate and rhythm.  LUNGS:  Clear to auscultation bilaterally.  BACK:  No CVA tenderness bilaterally.  ABDOMEN:  Nontender.  EXTREMITIES:  No clubbing, cyanosis or edema bilaterally.  NEUROLOGIC:  Within normal limits.  GENITOURINARY:  Vulva and vaginal examination within normal limits.  Uterus  is top normal size, mobile and nontender.  No adnexal masses.   LABORATORY DATA:  On colposcopy biopsies at 5 o'clock were significant  squamous cell carcinoma in situ.  Biopsy at 3 o'clock on the cervix was  significant for moderate dysplasia.  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  Endocervical  curettage was benign endocervix and detached squamous fragments, with  carcinoma in situ CIN-3.  Endometrial biopsy  was significant for scant  atrophic to weakly proliferative endometrium and detached squamous fragments  of CIN-3.   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  Normal ECC  endometrial biopsy, with atypical endometrial glandular cell abnormality  found on Pap smear.  I plan to do a LEEP excision of the cervix to further  differentiate and hopefully treat the CIN-3.  The patient understands the risks of, but not limited to:  bleeding,  infection, damage to cervix; also retained abnormal cells.  I plan to do a  D&C hysteroscopy to evaluate to the endometrium.  Even though her  endometrial biopsy was found to be within normal limits, because she has  some atypical endometrial cells on the Pap smear I find it better to  investigate.  She understands the risks of, but not limited to:  bleeding,  infection, perforation of the uterus (which can lead to other surgeries).  The patient agrees with the procedure.                                                Naima A. Charlesetta Garibaldi, M.D.    NAD/MEDQ  D:  04/22/2003  T:  04/22/2003  Job:  [id number]

## 2010-06-25 NOTE — Op Note (Signed)
. [facility]  [name], Karen Myers                   MRN: [id number] Proc. Date: 07/11/00 Adm. Date:  [id number] Attending:  Sherolyn Buba CC:         Dr.Smith, Oncology   Operative Report  PREOPERATIVE DIAGNOSIS: 1. Infiltrating lobular carcinoma of the left breast. 2. Poor venous access.  POSTOPERATIVE DIAGNOSIS: 1. Infiltrating lobular carcinoma of the left breast. 2. Poor venous access.  OPERATION PERFORMED:  Bilateral total mastectomies and Port-A-Cath implantation.  SURGEON:  Candee Furbish. Bubba Camp, M.D.  ASSISTANT:  Maia Plan. Lindon Romp, M.D.  ANESTHESIA:  General.  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  She having had all of her risks and benefits described to her in detail, she has opted for bilateral mastectomy, given the large recurrence and bilaterality of her tumor.  It should be noted that on excision of the left lumpectomy, there was florid lobular carcinoma in situ at the margins.  She is also being staged for chemotherapy and it is also planned to implant a Port-A-Cath device so as to facilitate her chemotherapy at that time.  DESCRIPTION OF PROCEDURE:  Following the induction of satisfactory general anesthesia, the patient was positioned supinely, the breasts and anterior chest and neck were prepped and draped to be included in a sterile operative field.  I started on the left side making an elliptical incision around the old biopsy site inclusive of the nipple, deepened this through the skin and subcutaneous tissues and raising a superior and medial flap to the clavicle and inferolateral flap to the rectus and to the latissimus dorsi.  I then dissected the breast tissue away from the anterior chest wall taking along the anterior pectoralis fascia  dissecting laterally out to the latissimus dorsi, thereby removing the left breast.  Hemostasis was obtained with electrocautery and this wound was packed with saline soaked gauze.  Attention was then turned to the right side where similarly, I made a symmetrically placed elliptical incision around the breast tissue deepened this through the skin and subcutaneous tissues and raising a superomedial flap to the sternal border and the clavicle, inferolateral flap to the rectus and to the latissimus dorsi.  I then dissected the breast tissue away from the pectoralis muscle along with the anterior pectoralis fascia removing the breast in its entirety and forwarding the right breast for pathologic evaluation.  We then placed the patient in Trendelenburg.  Head and neck turned to the left.  I made a subclavian stick into the left subclavian vein and threaded the guide wire under fluoroscopic guidance into the right atrium. I then created a tunnel from underneath the breast flap up through the tissues up to the shoulder and pulled through a Silastic catheter and Port-A-Cath. The Port-A-Cath and Silastic catheter was then flushed.  I used a size 10 [place] introducer and dilator over the guide wire and put it in the central venous system under fluoroscopic guidance.  The dilator and guide wire were removed and the Silastic catheter threaded into the central venous system and positioned at the atriovena caval junction.  The peel-away inserter was then removed.  Inflow of heparinized saline and backflow of blood were noted to be excellent.  The external portion of the catheter was then trimmed and attached to the  Port-A-Cath reservoir.  The Port-A-Cath reservoir was sutured onto the pectoralis major muscle.  Final fluoroscopic evaluation of the Port-A-Cath placement showed that the Port-A-Cath was well seated in the muscle.  There were no kinks, bends or unusual turns in the course of the Silastic  catheter and the tip of the catheter was at the atrial and vena caval junction. Sponge, instrument and sharp counts were verified. The wound was closed in layers as follows:  The subcutaneous tissues of both breast wounds was closed with interrupted 2-0 Vicryl sutures.  I created a small pocket around the Port-A-Cath with 2-0 Vicryl sutures.  I then placed a 10 mm Blake drain in both wounds and secured it to the skin with 2-0 Vicryl sutures.  The skin incision was then closed with a running suture of 4-0 Monocryl, then reinforced with Steri-Strips and sterile dressings applied.  Anesthetic reversed.  Patient removed from the operating room to the recovery room in stable condition having tolerated the procedure well. DD:  07/11/00 TD:  07/11/00 Job: [id number]

## 2010-06-25 NOTE — Op Note (Signed)
[facility]  [name], Karen Myers                   MRN: [id number] Proc. Date: 05/18/00 Adm. Date:  [id number] Attending:  Sherolyn Buba                           Operative Report  PREOPERATIVE DIAGNOSIS:  Carcinoma left breast.  POSTOPERATIVE DIAGNOSIS:  Carcinoma left breast.  PROCEDURE:  Partial mastectomy and sentinel lymph node biopsy left side.  SURGEON:  Candee Furbish. Bubba Camp, M.D.  ASSISTANT:  Nurse.  ANESTHESIA:  General.  INDICATIONS:  [name] is a 65 year old woman presenting with an abnormal mammogram showing a lesion at the areolar border, which on core biopsy showed an invasive lobular carcinoma.  She was brought to the operating room for lumpectomy with sentinel lymph node biopsy.  DESCRIPTION OF PROCEDURE:  Following the induction of anesthesia, the patient was positioned supinely, and the right breast and axilla prepped and draped routinely.  The localizing needle placed into the lesion was placed somewhat at the 9 oclock position and identifying the lesion at about the 12:30 position.  I made a circumareolar incision and deepened this through the skin and subcutaneous tissue dissecting superior and lateral flaps, inferior and medial flaps so as to get around the tissues.  Breast tissue was taken down on all sides of the tumor to the chest wall and the entire mass was removed and forwarded for pathologic evaluation.  Touch preps were negative for tumor. Using the neoprobe, I focused on an area of increased radioactivity in the axilla.  I made a transverse axillary incision, deepened this through the skin and subcutaneous tissue and carrying the dissection down to the axillary node. The node was dyed blue from the Lymphazurin that was injected immediately before surgery.  This was forwarded for pathologic evaluation and was negative on touch prep.  All areas of dissection were then checked for  hemostasis. Additional bleeding points were treated with electrocautery.  Sponge, instrument and sharp counts were verified.  Both wounds were closed as follows: subcutaneous tissues closed with interrupted 3-0 Vicryl sutures, skin closed with 4-0 Monocryl, both in the axilla and on the breast.  Sterile dressings were then applied.  Anesthetic reversed and patient removed from the operating room to the recovery room in stable condition.  She tolerated the procedure well. DD:  05/18/00 TD:  05/18/00 Job: [id number]

## 2010-06-25 NOTE — Discharge Summary (Signed)
NAME:  LAREN, LAWRIE NO.:  [id number]   MEDICAL RECORD NO.:  [id number]          PATIENT TYPE:  INP   LOCATION:  [id number]                         FACILITY:  [facility]   PHYSICIAN:  Jeanann Lewandowsky, M.D.    DATE OF BIRTH:  02/09/1945   DATE OF ADMISSION:  10/16/2004  DATE OF DISCHARGE:  10/19/2004                                 DISCHARGE SUMMARY   DISCHARGE DIAGNOSES:  1.  Dehydration.  2.  Gastroenteritis.  3.  Renal insufficiency.  4.  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  5.  Systemic hypertension.  6.  Status post mastectomy for carcinoma of the breast and prophylaxis.   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  The patient's past medical history is remarkable for the absence  of chronic gastrointestinal problems.  She specifically denies any  hematemesis, melena, hematochezia, and no prior history of peptic ulcer  disease or inflammatory bowel disease.   PERTINENT PHYSICAL FINDINGS:  GENERAL APPEARANCE:  She is a well-developed,  well-nourished African American woman appearing somewhat weak and a bit  __________.  Respirations were unlabored.  VITAL SIGNS:  Blood pressure on admission was 91/66 in the sitting position  and 112/56 lying.  Pulse goes up from 83 to 104 sitting with her legs  dangling.  Her respiratory rate is unlabored at 16-20, and her temperature  is 97.6.  SKIN:  Cool and dry.  No rashes are noted.  HEENT:  Head is atraumatic and normocephalic.  Sclerae anicteric.  No  conjunctival pallor.  NECK:  Supple.  No adenopathy, thyromegaly or jugular venous distention.  CHEST:  She has bilateral mastectomy.  There is no evidence of any local  recurrence.  Axilla is without any adenopathy.  LUNGS:  Clear to percussion and auscultation.  CARDIAC:      Precordium was 2+ dynamic with normal heart sounds.  No  murmurs, rubs, gallops, heaves or thrills.  Bowel sounds are increased.  No  rushes are noted.  EXTREMITIES:  No cyanosis, clubbing or edema.  No calf tenderness.  Negative  Homans sign.  BACK:  Without any CVA tenderness.  NEUROLOGICAL:  Alert and oriented, cooperative, without any focal sensory,  motor or reflex deficit.   PERTINENT LABORATORY DATA:  White count is 5400, hematocrit 35, hemoglobin  12.  Metabolic panel:  Sodium 0000000, potassium 3.5, chloride 105, CO2 24,  glucose 105, BUN 44, creatinine 3.8, calcium 8.7.  OT/PT are normal as well  as alk-phos.  Urine is yellow and clear.  Specific gravity of 1.018.  Negative nitrites, leukocyte esterase, protein, ketones, bilirubin.  She had  a small amount of hemoglobin and glucose is negative.  EKG reveals a normal  sinus rhythm.  There is left axis deviation.  No acute ST-T wave changes,  and otherwise, normal tracing.   HOSPITAL COURSE:  The patient is admitted clinically dehydrated with  relative hypotension and renal insufficiency.  It is presumed that she has  acute and subacute gastroenteritis producing loss of fluid volume,  superimposed on the chronic use of hydrochlorothiazide which she uses for  control of her blood pressure.  The patient was started on parenteral IV  replacement therapy with potassium chloride.  Initially, she was started on  clear liquids and progressively was taken to a regular diet without any  difficulties.  She had no significant abdominal pain while here in the  hospital.  With hydration, her BUN and creatinine declined aggressively with  the creatinine being 1.8, three days post admission.  Her potassium did  regain nadir of 3.2, but prior to her discharge was back up to 3.6.   Because of persistent complaints of pain in the right shoulder, a plain x-  ray was obtained and did reveal some cystic lucencies in the glenoid area  with surrounding  sclerotic margins suggesting subchondral cysts.  No acute  pathology was noted.  The patient was not treated with any antiinflammatory  agents during the hospitalization in view of her gastroenteritis, but was  treated p.r.n. for the right shoulder pain.  No other problems were noted.  The patient was continued off of her hydrochlorothiazide and was instructed  to do so until seen in the office.  She is to resume her Lipitor 20 mg  daily, Cardizem 240 mg daily, and her Arimidex at 1 mg per day.  She is  asked to hold the Benicar and HCT.  She is discharged home on a 3 g sodium,  modified fat restricted diet.  She is to be seen in the office in 2-3 weeks.           ______________________________  Jeanann Lewandowsky, M.D.     PC/MEDQ  D:  12/16/2004  T:  12/16/2004  Job:  [id number]

## 2010-09-17 ENCOUNTER — Other Ambulatory Visit: Payer: Self-pay

## 2010-10-20 ENCOUNTER — Ambulatory Visit
Admission: RE | Admit: 2010-10-20 | Discharge: 2010-10-20 | Disposition: A | Discharge status: Home / Self Care | Payer: BC Managed Care – PPO | Source: Ambulatory Visit | Attending: Oncology | Admitting: Oncology

## 2010-10-20 DIAGNOSIS — M8580 Other specified disorders of bone density and structure, unspecified site: Secondary | ICD-10-CM

## 2010-11-04 ENCOUNTER — Other Ambulatory Visit: Payer: Self-pay | Admitting: Oncology

## 2010-11-04 ENCOUNTER — Encounter (HOSPITAL_BASED_OUTPATIENT_CLINIC_OR_DEPARTMENT_OTHER): Payer: BC Managed Care – PPO | Admitting: Oncology

## 2010-11-04 DIAGNOSIS — Z170 Estrogen receptor positive status [ER+]: Secondary | ICD-10-CM

## 2010-11-04 DIAGNOSIS — C50419 Malignant neoplasm of upper-outer quadrant of unspecified female breast: Secondary | ICD-10-CM

## 2010-11-04 DIAGNOSIS — N183 Chronic kidney disease, stage 3 unspecified: Secondary | ICD-10-CM

## 2010-11-04 LAB — CBC WITH DIFFERENTIAL/PLATELET
BASO%: 0.5 % (ref 0.0–2.0)
Basophils Absolute: 0 10*3/uL (ref 0.0–0.1)
EOS%: 1.4 % (ref 0.0–7.0)
HCT: 37 % (ref 34.8–46.6)
HGB: 12.6 g/dL (ref 11.6–15.9)
MCH: 29.8 pg (ref 25.1–34.0)
MCHC: 34 g/dL (ref 31.5–36.0)
MCV: 87.8 fL (ref 79.5–101.0)
MONO%: 6.4 % (ref 0.0–14.0)
NEUT%: 69 % (ref 38.4–76.8)
RDW: 13.9 % (ref 11.2–14.5)
lymph#: 1.5 10*3/uL (ref 0.9–3.3)

## 2010-11-05 LAB — COMPREHENSIVE METABOLIC PANEL
ALT: 9 U/L (ref 0–35)
CO2: 23 mEq/L (ref 19–32)
Sodium: 142 mEq/L (ref 135–145)
Total Bilirubin: 0.5 mg/dL (ref 0.3–1.2)
Total Protein: 6.6 g/dL (ref 6.0–8.3)

## 2010-11-05 LAB — LIPID PANEL
HDL: 43 mg/dL (ref >39)
LDL Cholesterol: 119 mg/dL — ABNORMAL HIGH (ref 0–99)
Total CHOL/HDL Ratio: 4.6 Ratio
VLDL: 34 mg/dL (ref 0–40)

## 2010-11-05 LAB — PTH, INTACT AND CALCIUM: PTH: 156.5 pg/mL — ABNORMAL HIGH (ref 14.0–72.0)

## 2010-11-05 LAB — CANCER ANTIGEN 27.29: CA 27.29: 13 U/mL (ref 0–39)

## 2010-11-09 ENCOUNTER — Encounter: Payer: Self-pay | Admitting: *Deleted

## 2010-11-19 ENCOUNTER — Encounter (HOSPITAL_BASED_OUTPATIENT_CLINIC_OR_DEPARTMENT_OTHER): Payer: BC Managed Care – PPO | Admitting: Oncology

## 2010-11-19 DIAGNOSIS — N289 Disorder of kidney and ureter, unspecified: Secondary | ICD-10-CM

## 2010-11-19 DIAGNOSIS — C50419 Malignant neoplasm of upper-outer quadrant of unspecified female breast: Secondary | ICD-10-CM

## 2010-11-19 DIAGNOSIS — Z170 Estrogen receptor positive status [ER+]: Secondary | ICD-10-CM

## 2010-12-01 ENCOUNTER — Encounter: Payer: Self-pay | Admitting: Oncology

## 2010-12-01 ENCOUNTER — Telehealth: Payer: Self-pay | Admitting: Oncology

## 2010-12-01 NOTE — Telephone Encounter (Signed)
Mailed appts for 05/13/11 lab @ 3.15p and 05/20/11 md appt @ 3p.

## 2010-12-02 ENCOUNTER — Encounter: Payer: Self-pay | Admitting: *Deleted

## 2010-12-02 DIAGNOSIS — N289 Disorder of kidney and ureter, unspecified: Secondary | ICD-10-CM

## 2010-12-02 DIAGNOSIS — E785 Hyperlipidemia, unspecified: Secondary | ICD-10-CM | POA: Insufficient documentation

## 2010-12-02 DIAGNOSIS — C50919 Malignant neoplasm of unspecified site of unspecified female breast: Secondary | ICD-10-CM | POA: Insufficient documentation

## 2010-12-02 DIAGNOSIS — I10 Essential (primary) hypertension: Secondary | ICD-10-CM

## 2010-12-10 ENCOUNTER — Other Ambulatory Visit (HOSPITAL_COMMUNITY): Payer: Self-pay | Admitting: Nephrology

## 2010-12-21 ENCOUNTER — Ambulatory Visit (HOSPITAL_COMMUNITY): Payer: BC Managed Care – PPO

## 2010-12-21 ENCOUNTER — Encounter (HOSPITAL_COMMUNITY)
Admission: RE | Admit: 2010-12-21 | Discharge: 2010-12-21 | Disposition: A | Discharge status: Home / Self Care | Payer: BC Managed Care – PPO | Source: Ambulatory Visit | Attending: Nephrology | Admitting: Nephrology

## 2010-12-21 MED ORDER — TECHNETIUM TC 99M SESTAMIBI - CARDIOLITE
25.0000 | Freq: Once | INTRAVENOUS | Status: AC | PRN
  Administered 2010-12-21: 13:00:00 25 via INTRAVENOUS

## 2010-12-22 ENCOUNTER — Telehealth: Payer: Self-pay | Admitting: *Deleted

## 2010-12-22 NOTE — Telephone Encounter (Signed)
Mailed out letter and calendar to inform the patient of the new date and time

## 2011-04-26 ENCOUNTER — Other Ambulatory Visit: Payer: Self-pay | Admitting: Gastroenterology

## 2011-05-13 ENCOUNTER — Other Ambulatory Visit (HOSPITAL_BASED_OUTPATIENT_CLINIC_OR_DEPARTMENT_OTHER): Payer: BC Managed Care – PPO | Admitting: Lab

## 2011-05-13 ENCOUNTER — Other Ambulatory Visit: Payer: BC Managed Care – PPO | Admitting: Lab

## 2011-05-13 DIAGNOSIS — N289 Disorder of kidney and ureter, unspecified: Secondary | ICD-10-CM

## 2011-05-13 DIAGNOSIS — Z170 Estrogen receptor positive status [ER+]: Secondary | ICD-10-CM

## 2011-05-13 DIAGNOSIS — C50419 Malignant neoplasm of upper-outer quadrant of unspecified female breast: Secondary | ICD-10-CM

## 2011-05-13 LAB — CBC WITH DIFFERENTIAL/PLATELET
BASO%: 0.9 % (ref 0.0–2.0)
EOS%: 0.8 % (ref 0.0–7.0)
HCT: 37.6 % (ref 34.8–46.6)
LYMPH%: 25 % (ref 14.0–49.7)
MCH: 28.5 pg (ref 25.1–34.0)
MCHC: 32.9 g/dL (ref 31.5–36.0)
NEUT%: 68 % (ref 38.4–76.8)
RBC: 4.34 10*6/uL (ref 3.70–5.45)
WBC: 8.5 10*3/uL (ref 3.9–10.3)
lymph#: 2.1 10*3/uL (ref 0.9–3.3)
nRBC: 0 % (ref 0–0)

## 2011-05-16 LAB — COMPREHENSIVE METABOLIC PANEL
ALT: 14 U/L (ref 0–35)
AST: 20 U/L (ref 0–37)
Alkaline Phosphatase: 153 U/L — ABNORMAL HIGH (ref 39–117)
BUN: 23 mg/dL (ref 6–23)
Creatinine, Ser: 1.92 mg/dL — ABNORMAL HIGH (ref 0.50–1.10)
Potassium: 4.2 mEq/L (ref 3.5–5.3)

## 2011-05-20 ENCOUNTER — Ambulatory Visit (HOSPITAL_BASED_OUTPATIENT_CLINIC_OR_DEPARTMENT_OTHER): Payer: BC Managed Care – PPO | Admitting: Oncology

## 2011-05-20 ENCOUNTER — Encounter: Payer: Self-pay | Admitting: *Deleted

## 2011-05-20 VITALS — BP 133/77 | HR 88 | Temp 98.6°F | Ht 67.0 in | Wt 182.8 lb

## 2011-05-20 DIAGNOSIS — Z170 Estrogen receptor positive status [ER+]: Secondary | ICD-10-CM

## 2011-05-20 DIAGNOSIS — C50919 Malignant neoplasm of unspecified site of unspecified female breast: Secondary | ICD-10-CM

## 2011-05-20 DIAGNOSIS — N289 Disorder of kidney and ureter, unspecified: Secondary | ICD-10-CM

## 2011-05-20 DIAGNOSIS — C50419 Malignant neoplasm of upper-outer quadrant of unspecified female breast: Secondary | ICD-10-CM

## 2011-05-20 NOTE — Progress Notes (Signed)
05/20/11 @ 3:00pm, NSABP B-42, Final AE Assessment:  Karen Myers into the [facility] for a visit with Dr. Truddie Coco to assess adverse events per the B-42 study.  She completed study drug in October, 2012.  Spoke with her beforehand and she denied any bone fractures, arterial clots, new lipid lowering medication or cardiac abnormalities.  Thanked her for her dedication to the study and advised her that once the data analysis is complete, she will receive a patient letter about the outcome of the study.  She will have her future follow up assessments with her PCP, since she is over 10 out from diagnosis of breast cancer.

## 2011-05-22 NOTE — Progress Notes (Signed)
Hematology and Oncology Follow Up Visit  Karen Myers [id number] 16-Oct-1945 66 y.o. 05/22/2011 11:18 PM PCP Karen Myers,   1. Principle Diagnosis: T1c N0 breast cancer status post 4 cycles of AC chemotherapy completed 10/01/2000, on Arimidex times 5 years status post bilateral mastectomies, status post completion of B42 study, October 2012. History of renal insufficiency with diagnosed membranous nephropathy  Interim History:  There have been no intercurrent illness, hospitalizations or medication changes.  Medications: I have reviewed the patient's current medications.  Allergies: No Known Allergies  Past Medical History, Surgical history, Social history, and Family History were reviewed and updated.  Review of Systems: Constitutional:  Negative for fever, chills, night sweats, anorexia, weight loss, pain. Cardiovascular: no chest pain or dyspnea on exertion Respiratory: no cough, shortness of breath, or wheezing Neurological: negative Dermatological: negative ENT: negative Skin Gastrointestinal: no abdominal pain, change in bowel habits, or black or bloody stools Genito-Urinary: negative Hematological and Lymphatic: negative Breast: negative Musculoskeletal: negative Remaining ROS negative.  Physical Exam: Blood pressure 133/77, pulse 88, temperature 98.6 F (37 C), temperature source Oral, height 5\' 7"  (1.702 m), weight 182 lb 12.8 oz (82.918 kg). ECOG:  General appearance: alert, cooperative and appears stated age Head: Normocephalic, without obvious abnormality, atraumatic Neck: no adenopathy, no carotid bruit, no JVD, supple, symmetrical, trachea midline and thyroid not enlarged, symmetric, no tenderness/mass/nodules Lymph nodes: Cervical, supraclavicular, and axillary nodes normal. Cardiac : regular rate and rhythm, no murmurs or gallops Pulmonary:clear to auscultation bilaterally and normal percussion bilaterally Breasts: inspection negative, s/p bilateral  mastectomy , w/out evidence of local recurrence. Abdomen:soft, non-tender; bowel sounds normal; no masses,  no organomegaly Extremities negative Neuro: alert, oriented, normal speech, no focal findings or movement disorder noted  Lab Results: Lab Results  Component Value Date   WBC 8.5 05/13/2011   HGB 12.4 05/13/2011   HCT 37.6 05/13/2011   MCV 86.7 05/13/2011   PLT 284 05/13/2011     Chemistry      Component Value Date/Time   NA 143 05/13/2011 0736   K 4.2 05/13/2011 0736   CL 107 05/13/2011 0736   CO2 25 05/13/2011 0736   BUN 23 05/13/2011 0736   CREATININE 1.92* 05/13/2011 0736      Component Value Date/Time   CALCIUM 10.1 05/13/2011 0736   CALCIUM 10.4 11/04/2010 0817   ALKPHOS 153* 05/13/2011 0736   AST 20 05/13/2011 0736   ALT 14 05/13/2011 0736   BILITOT 0.3 05/13/2011 0736      .pathology. Radiological Studies: chest X-ray n/a Mammogram n/a Bone density n/a  Impression and Plan: Karen Myers is doing well, and is well over 10 yrs from diagnosis. I have recommended that she be d/ced from out practice and be followed by her primary care doctor, i will be available to her should the need arise. Her Cr is 1.92 and she may need further f/u with Karen Myers.  More than 50% of the visit was spent in patient-related counselling   Karen Esters, MD 4/14/201311:18 PM

## 2011-06-12 ENCOUNTER — Encounter (HOSPITAL_COMMUNITY): Payer: Self-pay | Admitting: *Deleted

## 2011-06-12 ENCOUNTER — Emergency Department (INDEPENDENT_AMBULATORY_CARE_PROVIDER_SITE_OTHER)
Admission: EM | Admit: 2011-06-12 | Discharge: 2011-06-12 | Disposition: A | Discharge status: Home / Self Care | Payer: BC Managed Care – PPO | Source: Home / Self Care | Attending: Family Medicine | Admitting: Family Medicine

## 2011-06-12 DIAGNOSIS — H1045 Other chronic allergic conjunctivitis: Secondary | ICD-10-CM

## 2011-06-12 DIAGNOSIS — H1012 Acute atopic conjunctivitis, left eye: Secondary | ICD-10-CM

## 2011-06-12 HISTORY — DX: Essential (primary) hypertension: I10

## 2011-06-12 HISTORY — DX: Pure hypercholesterolemia, unspecified: E78.00

## 2011-06-12 HISTORY — DX: Malignant neoplasm of unspecified site of unspecified female breast: C50.919

## 2011-06-12 MED ORDER — TETRACAINE HCL 0.5 % OP SOLN
OPHTHALMIC | Status: AC
  Filled 2011-06-12: qty 2

## 2011-06-12 MED ORDER — AZELASTINE HCL 0.05 % OP SOLN: 1.0000 [drp] | Freq: Two times a day (BID) | OPHTHALMIC | Status: DC

## 2011-06-12 MED ORDER — TETANUS-DIPHTH-ACELL PERTUSSIS 5-2.5-18.5 LF-MCG/0.5 IM SUSP
INTRAMUSCULAR | Status: AC
  Filled 2011-06-12: qty 0.5

## 2011-06-12 NOTE — ED Provider Notes (Signed)
History     CSN: [id number]  Arrival date & time 06/12/11  0901   First MD Initiated Contact with Patient 06/12/11 [phone]      Chief Complaint  Patient presents with  . Eye Problem    (Consider location/radiation/quality/duration/timing/severity/associated sxs/prior treatment) Patient is a 66 y.o. female presenting with eye problem. The history is provided by the patient.  Eye Problem  This is a new problem. The current episode started 2 days ago. The problem has not changed since onset.There is pain in the left eye. There was no injury mechanism. There is no history of trauma to the eye. There is no known exposure to pink eye. She does not wear contacts. Associated symptoms include photophobia, eye redness and itching. Pertinent negatives include no numbness, no blurred vision, no decreased vision and no discharge. She has tried eye drops for the symptoms. The treatment provided mild relief.    Past Medical History  Diagnosis Date  . High cholesterol   . Hypertension   . Breast cancer     Past Surgical History  Procedure Date  . Mastectomy   . Colonoscopy     History reviewed. No pertinent family history.  History  Substance Use Topics  . Smoking status: Never Smoker   . Smokeless tobacco: Not on file  . Alcohol Use: No    OB History    Grav Para Term Preterm Abortions TAB SAB Ect Mult Living                  Review of Systems  Constitutional: Negative.   HENT: Positive for congestion and rhinorrhea.   Eyes: Positive for photophobia, redness and itching. Negative for blurred vision, pain, discharge and visual disturbance.  Respiratory: Negative.   Skin: Positive for itching.  Neurological: Negative for numbness.    Allergies  Review of patient's allergies indicates no known allergies.  Home Medications   Current Outpatient Rx  Name Route Sig Dispense Refill  . ASPIRIN 81 MG PO TABS Oral Take 81 mg by mouth daily.    . ATORVASTATIN CALCIUM 40 MG PO TABS  Oral Take 40 mg by mouth daily.     . CHOLECALCIFEROL 400 UNITS PO TABS Oral Take 400 Units by mouth.    Marland Kitchen DILTIAZEM HCL ER COATED BEADS 180 MG PO CP24 Oral Take 240 mg by mouth daily.      Marland Kitchen VALSARTAN 160 MG PO TABS Oral Take 160 mg by mouth daily.      . AZELASTINE HCL 0.05 % OP SOLN Both Eyes Place 1 drop into both eyes 2 (two) times daily. 6 mL 1  . LETROZOLE/PLACEBO 2.5MG  TABS #110 NSABP B-42 Oral Take 2.5 mg by mouth daily. Last dose 11/19/10      BP 158/96  Pulse 84  Temp(Src) 97.7 F (36.5 C) (Oral)  Resp 18  SpO2 98%  Physical Exam  Nursing note and vitals reviewed. Constitutional: She appears well-developed and well-nourished.  HENT:  Head: Normocephalic.  Mouth/Throat: Oropharynx is clear and moist.  Eyes: EOM and lids are normal. Pupils are equal, round, and reactive to light. No foreign bodies found. Right eye exhibits no discharge. Left eye exhibits no discharge. Right conjunctiva is not injected. Left conjunctiva is injected.      ED Course  Procedures (including critical care time)  Labs Reviewed - No data to display No results found.   1. Allergic conjunctivitis, acute, left       MDM  Billy Fischer, MD 06/12/11 [phone]

## 2011-06-12 NOTE — ED Notes (Signed)
Pt with c/o left eye irritation onset yesterday - sensitive to light - tearing more -

## 2011-09-12 ENCOUNTER — Encounter (HOSPITAL_COMMUNITY): Payer: Self-pay | Admitting: Emergency Medicine

## 2011-09-12 ENCOUNTER — Emergency Department (HOSPITAL_COMMUNITY)
Admission: EM | Admit: 2011-09-12 | Discharge: 2011-09-12 | Disposition: A | Discharge status: Home / Self Care | Payer: BC Managed Care – PPO | Source: Home / Self Care | Attending: Emergency Medicine | Admitting: Emergency Medicine

## 2011-09-12 DIAGNOSIS — S0501XA Injury of conjunctiva and corneal abrasion without foreign body, right eye, initial encounter: Secondary | ICD-10-CM

## 2011-09-12 MED ORDER — TETRACAINE HCL 0.5 % OP SOLN
1.0000 [drp] | Freq: Once | OPHTHALMIC | Status: AC
  Administered 2011-09-12: 2 [drp] via OPHTHALMIC

## 2011-09-12 MED ORDER — POLYETHYL GLYCOL-PROPYL GLYCOL 0.4-0.3 % OP SOLN: 1.0000 [drp] | Freq: Four times a day (QID) | OPHTHALMIC | Status: DC | PRN

## 2011-09-12 MED ORDER — TETRACAINE HCL 0.5 % OP SOLN
OPHTHALMIC | Status: AC
  Filled 2011-09-12: qty 2

## 2011-09-12 MED ORDER — KETOROLAC TROMETHAMINE 0.5 % OP SOLN: OPHTHALMIC | Status: AC

## 2011-09-12 MED ORDER — MOXIFLOXACIN HCL 0.5 % OP SOLN: 1.0000 [drp] | Freq: Three times a day (TID) | OPHTHALMIC | Status: AC

## 2011-09-12 NOTE — ED Provider Notes (Signed)
History     CSN: [id number]  Arrival date & time 09/12/11  1300   First MD Initiated Contact with Patient 09/12/11 1400      Chief Complaint  Patient presents with  . Eye Pain    (Consider location/radiation/quality/duration/timing/severity/associated sxs/prior treatment) HPI Comments: Pt states she was applying facial powder the powder puff yesterday, and fell to the powder don't have her right eye. Admits to rubbing her eyes some. Reports persistent right eye irritation, redness, mild photophobia. No foreign body sensation. No visual changes, exudates, discharge, nausea, vomiting. She does not wear contacts. She wears reading glasses as needed.   ROS as noted in HPI. All other ROS negative.   Patient is a 66 y.o. female presenting with eye pain. The history is provided by the patient. No language interpreter was used.  Eye Pain This is a new problem. The current episode started yesterday. The problem occurs constantly. The problem has not changed since onset.Exacerbated by: light. Nothing relieves the symptoms. Treatments tried: optivar eyedrops. The treatment provided no relief.    Past Medical History  Diagnosis Date  . High cholesterol   . Hypertension   . Breast cancer     Past Surgical History  Procedure Date  . Mastectomy   . Colonoscopy     History reviewed. No pertinent family history.  History  Substance Use Topics  . Smoking status: Never Smoker   . Smokeless tobacco: Not on file  . Alcohol Use: No    OB History    Grav Para Term Preterm Abortions TAB SAB Ect Mult Living                  Review of Systems  Eyes: Positive for pain.    Allergies  Review of patient's allergies indicates no known allergies.  Home Medications   Current Outpatient Rx  Name Route Sig Dispense Refill  . CALCIUM + D PO Oral Take by mouth.    . ASPIRIN 81 MG PO TABS Oral Take 81 mg by mouth daily.    . ATORVASTATIN CALCIUM 40 MG PO TABS Oral Take 40 mg by mouth  daily.     . CHOLECALCIFEROL 400 UNITS PO TABS Oral Take 400 Units by mouth.    Marland Kitchen DILTIAZEM HCL ER COATED BEADS 180 MG PO CP24 Oral Take 240 mg by mouth daily.      Marland Kitchen KETOROLAC TROMETHAMINE 0.5 % OP SOLN  1 drop in affected eye 4 times a day x 5 days 3 mL 0  . MOXIFLOXACIN HCL 0.5 % OP SOLN Both Eyes Place 1 drop into both eyes 3 (three) times daily. X 7 days 3 mL 0  . POLYETHYL GLYCOL-PROPYL GLYCOL 0.4-0.3 % OP SOLN Ophthalmic Apply 1 drop to eye 4 (four) times daily as needed. 5 mL 0  . VALSARTAN 160 MG PO TABS Oral Take 160 mg by mouth daily.        BP 149/95  Pulse 70  Temp 97 F (36.1 C) (Oral)  Resp 18  SpO2 97%  Physical Exam  Nursing note and vitals reviewed. Constitutional: She is oriented to person, place, and time. She appears well-developed and well-nourished. No distress.  HENT:  Head: Normocephalic and atraumatic.  Eyes: Conjunctivae and EOM are normal. Pupils are equal, round, and reactive to light. Foreign body present in the right eye.  Slit lamp exam:      The right eye shows corneal abrasion and fluorescein uptake. The right eye shows no corneal flare,  no corneal ulcer, no foreign body, no hyphema and no hypopyon.         R Distance:  20/40 ;  L Distance:  20/40. Eyelash found under lower lid and removed.  Neck: Normal range of motion.  Cardiovascular: Normal rate.   Pulmonary/Chest: Effort normal.  Abdominal: She exhibits no distension.  Musculoskeletal: Normal range of motion.  Neurological: She is alert and oriented to person, place, and time. Coordination normal.  Skin: Skin is warm and dry.  Psychiatric: She has a normal mood and affect. Her behavior is normal. Judgment and thought content normal.    ED Course  Procedures (including critical care time)  Labs Reviewed - No data to display No results found.   1. Corneal abrasion, right       MDM  Very small corneal abrasion from the powder. Irrigated right eye thoroughly with sterile saline.  Home with sustaining, Vigamox, ketorolac eye drops. Followup with Dr. Baird Cancer, ophthalmologist on call in 2 days. Discussed signs and symptoms that should prompt her return to the department. Patient agrees with plan.  Cherly Beach, MD 09/12/11 [phone]

## 2011-09-12 NOTE — ED Notes (Signed)
Right eye pain, irritation.  Symptoms onset yesterday.  Patient does not wear contacts, does wear reading glasses.  No vision changes.  Sensitivity to light. Symptoms started after applying make up powder to face, uncertain if powder flew in eye or not.

## 2011-09-12 NOTE — ED Notes (Signed)
Eye box at bedside, TETRACAINE, SLIT LAMP AT BEDSIDE

## 2011-10-06 ENCOUNTER — Ambulatory Visit (INDEPENDENT_AMBULATORY_CARE_PROVIDER_SITE_OTHER): Payer: BC Managed Care – PPO | Admitting: Obstetrics and Gynecology

## 2011-10-06 ENCOUNTER — Encounter: Payer: Self-pay | Admitting: Obstetrics and Gynecology

## 2011-10-06 VITALS — BP 110/80 | HR 80 | Resp 16 | Ht 68.5 in | Wt 182.0 lb

## 2011-10-06 DIAGNOSIS — Z124 Encounter for screening for malignant neoplasm of cervix: Secondary | ICD-10-CM

## 2011-10-06 DIAGNOSIS — D069 Carcinoma in situ of cervix, unspecified: Secondary | ICD-10-CM | POA: Insufficient documentation

## 2011-10-06 NOTE — Progress Notes (Signed)
Last Pap: 09/07/2010 WNL: No Regular Periods:no Contraception: None  Monthly Breast exam:no Tetanus<36yrs:yes Nl.Bladder Function:yes Daily BMs:yes Healthy Diet:yes Calcium:yes Mammogram:no Masectomy Date of Mammogram:  Exercise:yes Have often Exercise: Walking 10 miles a wk. Seatbelt: yes Abuse at home: no Stressful work:no Sigmoid-colonoscopy: 05/2011 "4 Polyps will repeat every 5 years" Bone Density: Yes 2012 "WNL" PCP: Dr.Polite Change in PMH: No Changes Change in FMH:No Changes Physical Examination: Neck - supple, no significant adenopathy Chest - clear to auscultation, no wheezes, rales or rhonchi, symmetric air entry Heart - normal rate, regular rhythm, normal S1, S2, no murmurs, rubs, clicks or gallops Abdomen - soft, nontender, nondistended, no masses or organomegaly Breasts - breasts appear normal, no suspicious masses, no skin or nipple changes or axillary nodes Pelvic - normal external genitalia, vulva, vagina, cervix, uterus and adnexa, atrophic Rectal - normal rectal, no masses Musculoskeletal - no joint tenderness, deformity or swelling Extremities - peripheral pulses normal, no pedal edema, no clubbing or cyanosis Skin - normal coloration and turgor, no rashes, no suspicious skin lesions noted Normal AEX Pt due for mammogram no h/o B mastectomy colonoscopy due no Pap done pt with h/o CIN 3 RT one year Diet and exercise discussed

## 2011-10-07 LAB — PAP IG W/ RFLX HPV ASCU

## 2012-04-12 ENCOUNTER — Emergency Department (INDEPENDENT_AMBULATORY_CARE_PROVIDER_SITE_OTHER)
Admission: EM | Admit: 2012-04-12 | Discharge: 2012-04-12 | Disposition: A | Discharge status: Home / Self Care | Payer: BC Managed Care – PPO | Source: Home / Self Care | Attending: Emergency Medicine | Admitting: Emergency Medicine

## 2012-04-12 ENCOUNTER — Encounter (HOSPITAL_COMMUNITY): Payer: Self-pay | Admitting: Emergency Medicine

## 2012-04-12 DIAGNOSIS — J029 Acute pharyngitis, unspecified: Secondary | ICD-10-CM

## 2012-04-12 LAB — POCT RAPID STREP A: Streptococcus, Group A Screen (Direct): NEGATIVE

## 2012-04-12 NOTE — ED Provider Notes (Signed)
Chief Complaint  Patient presents with  . Sore Throat    History of Present Illness:   Karen Myers is a 67 year old female who has had a three-day history of sore throat, pain on swallowing, chills, muscle aches, sneezing, and dry cough. She denies any fever, headache, nasal congestion, wheezing, chest pain, or GI symptoms. She has not been exposed to anything in particular. She takes Cardura, Lipitor, diltiazem, and Diovan for hypertension hyperlipidemia.  Review of Systems:  Other than as noted above, the patient denies any of the following symptoms. Systemic:  No fever, chills, sweats, fatigue, myalgias, headache, or anorexia. Eye:  No redness, pain or drainage. ENT:  No earache, ear congestion, nasal congestion, sneezing, rhinorrhea, sinus pressure, sinus pain, or post nasal drip. Lungs:  No cough, sputum production, wheezing, shortness of breath, or chest pain. GI:  No abdominal pain, nausea, vomiting, or diarrhea. Skin:  No rash or itching.  [place]:  Past medical history, family history, social history, meds, allergies, and nurse's notes were reviewed.  There is no known exposure to strep or mono.  No prior history of step or mono.  The patient denies use of tobacco.  Physical Exam:   Vital signs:  BP 128/73  Pulse 93  Temp(Src) 99.2 F (37.3 C) (Oral)  Resp 16  SpO2 100% General:  Alert, in no distress. Eye:  No conjunctival injection or drainage. Lids were normal. ENT:  TMs and canals were normal, without erythema or inflammation.  Nasal mucosa was clear and uncongested, without drainage.  Mucous membranes were moist.  Exam of pharynx was normal with only minimal erythema, no exudate or drainage.  There were no oral ulcerations or lesions. Neck:  Supple, no adenopathy, tenderness or mass. Lungs:  No respiratory distress.  Lungs were clear to auscultation, without wheezes, rales or rhonchi.  Breath sounds were clear and equal bilaterally.  Heart:  Regular rhythm, without  gallops, murmers or rubs. Skin:  Clear, warm, and dry, without rash or lesions.  Labs:   Results for orders placed during the hospital encounter of 04/12/12  POCT RAPID STREP A ([facility] CARE ONLY)      Result Value Range   Streptococcus, Group A Screen (Direct) NEGATIVE  NEGATIVE    Assessment:  The encounter diagnosis was Viral pharyngitis.  Plan:   1.  The following meds were prescribed:   Discharge Medication List as of 04/12/2012  4:14 PM     2.  The patient was instructed in symptomatic care including hot saline gargles, throat lozenges, infectious precautions, and need to trade out toothbrush. Handouts were given. 3.  The patient was told to return if becoming worse in any way, if no better in 3 or 4 days, and given some red flag symptoms that would indicate earlier return.    Harden Mo, MD 04/12/12 2013

## 2012-04-12 NOTE — ED Notes (Signed)
Sore throat, chills, upper torso aching.  Onset tuesday

## 2012-06-20 ENCOUNTER — Telehealth: Payer: Self-pay | Admitting: Oncology

## 2012-06-20 NOTE — Telephone Encounter (Signed)
06/20/12 - Called and spoke with patient regarding the B-42 follow-up.  She stated she is doing good.  No problems. She had not been in the hospital, had any fractures or broken bones in the last year.  She only takes calcium.   She has not had a bone density done, no cardiac events or thrombotic events in the last year.  Will call her again next year.

## 2012-07-24 ENCOUNTER — Emergency Department (INDEPENDENT_AMBULATORY_CARE_PROVIDER_SITE_OTHER)
Admission: EM | Admit: 2012-07-24 | Discharge: 2012-07-24 | Disposition: A | Discharge status: Home / Self Care | Payer: BC Managed Care – PPO | Source: Home / Self Care | Attending: Family Medicine | Admitting: Family Medicine

## 2012-07-24 ENCOUNTER — Encounter (HOSPITAL_COMMUNITY): Payer: Self-pay | Admitting: Emergency Medicine

## 2012-07-24 DIAGNOSIS — J302 Other seasonal allergic rhinitis: Secondary | ICD-10-CM

## 2012-07-24 DIAGNOSIS — J309 Allergic rhinitis, unspecified: Secondary | ICD-10-CM

## 2012-07-24 MED ORDER — FLUTICASONE PROPIONATE 50 MCG/ACT NA SUSP: 1.0000 | Freq: Two times a day (BID) | NASAL | Status: DC

## 2012-07-24 NOTE — ED Provider Notes (Signed)
History     CSN: [id number]  Arrival date & time 07/24/12  1158   First MD Initiated Contact with Patient 07/24/12 1248      Chief Complaint  Patient presents with  . Sinusitis    (Consider location/radiation/quality/duration/timing/severity/associated sxs/prior treatment) Patient is a 67 y.o. female presenting with sinusitis. The history is provided by the patient.  Sinusitis Pain details:    Location:  Frontal   Quality:  Dull   Severity:  Mild   Duration:  4 days Progression:  Unchanged Chronicity:  New Context: allergies   Ineffective treatments:  Acetaminophen Associated symptoms: congestion, rhinorrhea and sneezing   Associated symptoms: no cough, no ear pain, no fever, no vomiting and no wheezing     Past Medical History  Diagnosis Date  . High cholesterol   . Hypertension   . Breast cancer   . CIN III (cervical intraepithelial neoplasia III)     prior to LEEP procedure  . Breast cancer     Past Surgical History  Procedure Laterality Date  . Mastectomy    . Colonoscopy    . Cervical biopsy  w/ loop electrode excision  2005  . Hysteroscopy w/d&c  2005  . Polypectomy    . Renal biopsy    . Breast lumpectomy    . Vulva /perineum biopsy      nevus on vulva     No family history on file.  History  Substance Use Topics  . Smoking status: Former Research scientist (life sciences)  . Smokeless tobacco: Never Used  . Alcohol Use: No    OB History   Grav Para Term Preterm Abortions TAB SAB Ect Mult Living   2 2        2       Review of Systems  Constitutional: Negative.  Negative for fever.  HENT: Positive for congestion, rhinorrhea and sneezing. Negative for ear pain.   Eyes: Negative.   Respiratory: Negative for cough and wheezing.   Gastrointestinal: Negative.  Negative for vomiting.  Genitourinary: Negative.     Allergies  Altace  Home Medications   Current Outpatient Rx  Name  Route  Sig  Dispense  Refill  . aspirin 81 MG tablet   Oral   Take 81 mg by mouth  daily.         Marland Kitchen atorvastatin (LIPITOR) 40 MG tablet   Oral   Take 40 mg by mouth daily.          . Calcium Carbonate-Vitamin D (CALCIUM + D PO)   Oral   Take by mouth.         . cholecalciferol (VITAMIN D) 400 UNITS TABS   Oral   Take 400 Units by mouth.         . diltiazem (CARTIA XT) 180 MG 24 hr capsule   Oral   Take 240 mg by mouth daily.           Marland Kitchen doxazosin (CARDURA) 2 MG tablet   Oral   Take 2 mg by mouth at bedtime.         Vladimir Faster Glycol-Propyl Glycol (SYSTANE) 0.4-0.3 % SOLN   Ophthalmic   Apply 1 drop to eye 4 (four) times daily as needed.   5 mL   0   . valsartan (DIOVAN) 160 MG tablet   Oral   Take 160 mg by mouth daily.           . fluticasone (FLONASE) 50 MCG/ACT nasal spray   Nasal  Place 1 spray into the nose 2 (two) times daily.   1 g   2     BP 134/82  Pulse 111  Temp(Src) 98.4 F (36.9 C) (Oral)  Resp 18  SpO2 99%  Physical Exam  Nursing note and vitals reviewed. Constitutional: She is oriented to person, place, and time. She appears well-developed and well-nourished.  HENT:  Head: Normocephalic.  Right Ear: External ear normal.  Left Ear: External ear normal.  Nose: Mucosal edema and rhinorrhea present.  Mouth/Throat: Oropharynx is clear and moist.  Neck: Normal range of motion. Neck supple.  Pulmonary/Chest: Effort normal and breath sounds normal.  Neurological: She is alert and oriented to person, place, and time.  Skin: Skin is warm and dry.    ED Course  Procedures (including critical care time)  Labs Reviewed - No data to display No results found.   1. Seasonal allergic rhinitis       MDM          Billy Fischer, MD 07/24/12 1258

## 2012-07-24 NOTE — ED Notes (Signed)
Pt c/o sinus pressure with a headache since Saturday. Pt c/o runny nose, sneezing and nasal congestion. Has been taking Tylenol and [place] with no relief. Patient is alert and oriented.

## 2012-08-09 ENCOUNTER — Emergency Department (INDEPENDENT_AMBULATORY_CARE_PROVIDER_SITE_OTHER)
Admission: EM | Admit: 2012-08-09 | Discharge: 2012-08-09 | Disposition: A | Discharge status: Home / Self Care | Payer: BC Managed Care – PPO | Source: Home / Self Care | Attending: Emergency Medicine | Admitting: Emergency Medicine

## 2012-08-09 ENCOUNTER — Encounter (HOSPITAL_COMMUNITY): Payer: Self-pay | Admitting: Emergency Medicine

## 2012-08-09 DIAGNOSIS — H109 Unspecified conjunctivitis: Secondary | ICD-10-CM

## 2012-08-09 MED ORDER — TETRACAINE HCL 0.5 % OP SOLN
OPHTHALMIC | Status: AC
  Filled 2012-08-09: qty 2

## 2012-08-09 MED ORDER — POLYMYXIN B-TRIMETHOPRIM 10000-0.1 UNIT/ML-% OP SOLN: 1.0000 [drp] | OPHTHALMIC | Status: DC

## 2012-08-09 MED ORDER — POLYMYXIN B-TRIMETHOPRIM 10000-0.1 UNIT/ML-% OP SOLN: 2.0000 [drp] | OPHTHALMIC | Status: DC

## 2012-08-09 NOTE — ED Provider Notes (Signed)
History    CSN: [id number] Arrival date & time 08/09/12  1024  First MD Initiated Contact with Patient 08/09/12 1047     Chief Complaint  Patient presents with  . Eye Pain   (Consider location/radiation/quality/duration/timing/severity/associated sxs/prior Treatment) HPI Comments: 67 year old female presents complaining of left eye irritation that began this morning. She has had a foreign body sensation in the left eye, along with a large amount of clear discharge. She denies crusting, blurry vision, or eye pain. No history of any eye disorders  Past Medical History  Diagnosis Date  . High cholesterol   . Hypertension   . Breast cancer   . CIN III (cervical intraepithelial neoplasia III)     prior to LEEP procedure  . Breast cancer    Past Surgical History  Procedure Laterality Date  . Mastectomy    . Colonoscopy    . Cervical biopsy  w/ loop electrode excision  2005  . Hysteroscopy w/d&c  2005  . Polypectomy    . Renal biopsy    . Breast lumpectomy    . Vulva /perineum biopsy      nevus on vulva    No family history on file. History  Substance Use Topics  . Smoking status: Former Research scientist (life sciences)  . Smokeless tobacco: Never Used  . Alcohol Use: No   OB History   Grav Para Term Preterm Abortions TAB SAB Ect Mult Living   2 2        2      Review of Systems  Constitutional: Negative for fever and chills.  Eyes: Positive for discharge, redness and itching. Negative for photophobia, pain and visual disturbance.  Respiratory: Negative for cough and shortness of breath.   Cardiovascular: Negative for chest pain, palpitations and leg swelling.  Gastrointestinal: Negative for nausea, vomiting and abdominal pain.  Endocrine: Negative for polydipsia and polyuria.  Genitourinary: Negative for dysuria, urgency and frequency.  Musculoskeletal: Negative for myalgias and arthralgias.  Skin: Negative for rash.  Neurological: Negative for dizziness, weakness and light-headedness.     Allergies  Altace  Home Medications   Current Outpatient Rx  Name  Route  Sig  Dispense  Refill  . aspirin 81 MG tablet   Oral   Take 81 mg by mouth daily.         Marland Kitchen atorvastatin (LIPITOR) 40 MG tablet   Oral   Take 40 mg by mouth daily.          . Calcium Carbonate-Vitamin D (CALCIUM + D PO)   Oral   Take by mouth.         . cholecalciferol (VITAMIN D) 400 UNITS TABS   Oral   Take 400 Units by mouth.         . diltiazem (CARTIA XT) 180 MG 24 hr capsule   Oral   Take 240 mg by mouth daily.          Marland Kitchen doxazosin (CARDURA) 2 MG tablet   Oral   Take 2 mg by mouth at bedtime.         . fluticasone (FLONASE) 50 MCG/ACT nasal spray   Nasal   Place 1 spray into the nose 2 (two) times daily.   1 g   2   . Polyethyl Glycol-Propyl Glycol (SYSTANE) 0.4-0.3 % SOLN   Ophthalmic   Apply 1 drop to eye 4 (four) times daily as needed.   5 mL   0   . trimethoprim-polymyxin b (POLYTRIM) ophthalmic solution  Left Eye   Place 1 drop into the left eye every 4 (four) hours.   10 mL   0   . valsartan (DIOVAN) 160 MG tablet   Oral   Take 160 mg by mouth daily.            BP 123/90  Pulse 79  Temp(Src) 98.1 F (36.7 C) (Oral)  SpO2 100% Physical Exam  Nursing note and vitals reviewed. Constitutional: She is oriented to person, place, and time. Vital signs are normal. She appears well-developed and well-nourished. No distress.  HENT:  Head: Atraumatic.  Eyes: EOM are normal. Pupils are equal, round, and reactive to light. Left eye exhibits discharge (minimal). Left conjunctiva is injected. Left conjunctiva has no hemorrhage.  Slit lamp exam:      The left eye shows no corneal abrasion, no corneal ulcer, no foreign body and no fluorescein uptake.  Pulmonary/Chest: Effort normal.  Neurological: She is alert and oriented to person, place, and time. She has normal strength.  Skin: Skin is warm and dry. She is not diaphoretic.  Psychiatric: She has a normal  mood and affect. Her behavior is normal. Judgment normal.    ED Course  Procedures (including critical care time) Labs Reviewed - No data to display No results found. 1. Conjunctivitis     MDM  We'll treat for simple conjunctivitis, she will followup if she does not improve   Meds ordered this encounter  Medications  . trimethoprim-polymyxin b (POLYTRIM) ophthalmic solution 2 drop    Sig:   . trimethoprim-polymyxin b (POLYTRIM) ophthalmic solution    Sig: Place 1 drop into the left eye every 4 (four) hours.    Dispense:  10 mL    Refill:  0     Liam Graham, PA-C 08/09/12 1152

## 2012-08-09 NOTE — ED Notes (Signed)
Eye box in room

## 2012-08-09 NOTE — ED Provider Notes (Signed)
Medical screening examination/treatment/procedure(s) were performed by non-physician practitioner and as supervising physician I was immediately available for consultation/collaboration.  Shatoya Roets, M.D.  Cortlynn Hollinsworth C Lilliane Sposito, MD 08/09/12 1312 

## 2012-08-09 NOTE — ED Notes (Signed)
Left eye irritated, left eye red, and feels like something in eye. Patient woke yesterday with something in eye.

## 2012-11-06 ENCOUNTER — Other Ambulatory Visit: Payer: Self-pay | Admitting: Internal Medicine

## 2012-11-06 DIAGNOSIS — M8580 Other specified disorders of bone density and structure, unspecified site: Secondary | ICD-10-CM

## 2012-12-18 ENCOUNTER — Other Ambulatory Visit: Payer: BC Managed Care – PPO

## 2013-01-18 ENCOUNTER — Ambulatory Visit
Admission: RE | Admit: 2013-01-18 | Discharge: 2013-01-18 | Disposition: A | Discharge status: Home / Self Care | Payer: Medicare Other | Source: Ambulatory Visit | Attending: Internal Medicine | Admitting: Internal Medicine

## 2013-01-18 DIAGNOSIS — M8580 Other specified disorders of bone density and structure, unspecified site: Secondary | ICD-10-CM

## 2013-12-09 ENCOUNTER — Encounter (HOSPITAL_COMMUNITY): Payer: Self-pay | Admitting: Emergency Medicine

## 2014-05-04 ENCOUNTER — Emergency Department (HOSPITAL_COMMUNITY)
Admission: EM | Admit: 2014-05-04 | Discharge: 2014-05-05 | Disposition: A | Discharge status: Home / Self Care | Payer: Medicare Other | Attending: Emergency Medicine | Admitting: Emergency Medicine

## 2014-05-04 ENCOUNTER — Emergency Department (HOSPITAL_COMMUNITY): Payer: Medicare Other

## 2014-05-04 ENCOUNTER — Encounter (HOSPITAL_COMMUNITY): Payer: Self-pay | Admitting: Emergency Medicine

## 2014-05-04 DIAGNOSIS — D649 Anemia, unspecified: Secondary | ICD-10-CM | POA: Diagnosis not present

## 2014-05-04 DIAGNOSIS — Z7951 Long term (current) use of inhaled steroids: Secondary | ICD-10-CM | POA: Insufficient documentation

## 2014-05-04 DIAGNOSIS — R0602 Shortness of breath: Secondary | ICD-10-CM | POA: Insufficient documentation

## 2014-05-04 DIAGNOSIS — R112 Nausea with vomiting, unspecified: Secondary | ICD-10-CM | POA: Diagnosis not present

## 2014-05-04 DIAGNOSIS — Z87891 Personal history of nicotine dependence: Secondary | ICD-10-CM | POA: Diagnosis not present

## 2014-05-04 DIAGNOSIS — Z853 Personal history of malignant neoplasm of breast: Secondary | ICD-10-CM | POA: Insufficient documentation

## 2014-05-04 DIAGNOSIS — N289 Disorder of kidney and ureter, unspecified: Secondary | ICD-10-CM

## 2014-05-04 DIAGNOSIS — I10 Essential (primary) hypertension: Secondary | ICD-10-CM | POA: Insufficient documentation

## 2014-05-04 DIAGNOSIS — R079 Chest pain, unspecified: Secondary | ICD-10-CM | POA: Diagnosis not present

## 2014-05-04 DIAGNOSIS — Z79899 Other long term (current) drug therapy: Secondary | ICD-10-CM | POA: Diagnosis not present

## 2014-05-04 DIAGNOSIS — Z87410 Personal history of cervical dysplasia: Secondary | ICD-10-CM | POA: Insufficient documentation

## 2014-05-04 DIAGNOSIS — Z7982 Long term (current) use of aspirin: Secondary | ICD-10-CM | POA: Insufficient documentation

## 2014-05-04 DIAGNOSIS — E780 Pure hypercholesterolemia: Secondary | ICD-10-CM | POA: Insufficient documentation

## 2014-05-04 LAB — CBC
HCT: 35.3 % — ABNORMAL LOW (ref 36.0–46.0)
HEMOGLOBIN: 11.5 g/dL — AB (ref 12.0–15.0)
MCH: 29 pg (ref 26.0–34.0)
MCHC: 32.6 g/dL (ref 30.0–36.0)
MCV: 89.1 fL (ref 78.0–100.0)
Platelets: 259 10*3/uL (ref 150–400)
RBC: 3.96 MIL/uL (ref 3.87–5.11)
RDW: 14.1 % (ref 11.5–15.5)
WBC: 7.4 10*3/uL (ref 4.0–10.5)

## 2014-05-04 LAB — I-STAT TROPONIN, ED: TROPONIN I, POC: 0 ng/mL (ref 0.00–0.08)

## 2014-05-04 MED ORDER — ASPIRIN 81 MG PO CHEW
324.0000 mg | CHEWABLE_TABLET | Freq: Once | ORAL | Status: AC
  Administered 2014-05-04: 324 mg via ORAL
  Filled 2014-05-04: qty 4

## 2014-05-04 MED ORDER — ONDANSETRON HCL 4 MG/2ML IJ SOLN
4.0000 mg | Freq: Once | INTRAMUSCULAR | Status: AC
  Administered 2014-05-04: 4 mg via INTRAVENOUS
  Filled 2014-05-04: qty 2

## 2014-05-04 NOTE — ED Notes (Signed)
C/o pressure in center of chest x 15 min with sob.  States she thought she was having heart burn.  Took Pepto bismol and vomited x 1.  Denies nausea and vomiting at present.

## 2014-05-04 NOTE — ED Provider Notes (Signed)
CSN: [id number]     Arrival date & time 05/04/14  2221 History  This chart was scribed for Delora Fuel, MD by Molli Posey, ED Scribe. This patient was seen in room A05C/A05C and the patient's care was started 11:05 PM.    Chief Complaint  Patient presents with  . Chest Pain   The history is provided by the patient. No language interpreter was used.   HPI Comments: Karen Myers is a 69 y.o. female with a history of HTN, hyperlipidemia, breast cancer and CIN III who presents to the Emergency Department complaining of central chest pain that started about an hour ago. Pt states that her CP was a 10/10 at onset. She states that the episode lasted about 15 minutes and describes her pain as a pressure. Pt reports that she experienced some minimal SOB, nausea and vomited after she took pepto bismol. She denies any pain radiation to any other locations. She reports that that her CP has improved at this time and she no longer feels nauseated at this time. Pt reports that laying down exacerbates her pain slightly. She states that she has never experienced a similar prior episode. She denies a history of smoking. Pt denies any recent long distance travel. She states that she takes aspirin 81mg  daily. Pt reports no family history of heart problems.    Past Medical History  Diagnosis Date  . High cholesterol   . Hypertension   . Breast cancer   . CIN III (cervical intraepithelial neoplasia III)     prior to LEEP procedure  . Breast cancer    Past Surgical History  Procedure Laterality Date  . Mastectomy    . Colonoscopy    . Cervical biopsy  w/ loop electrode excision  2005  . Hysteroscopy w/d&c  2005  . Polypectomy    . Renal biopsy    . Breast lumpectomy    . Vulva /perineum biopsy      nevus on vulva    No family history on file. History  Substance Use Topics  . Smoking status: Former Research scientist (life sciences)  . Smokeless tobacco: Never Used  . Alcohol Use: No   OB History    Gravida Para  Term Preterm AB TAB SAB Ectopic Multiple Living   2 2        2      Review of Systems  Respiratory: Positive for shortness of breath.   Cardiovascular: Positive for chest pain.  Gastrointestinal: Positive for nausea and vomiting.  All other systems reviewed and are negative.  Allergies  Altace  Home Medications   Prior to Admission medications   Medication Sig Start Date End Date Taking? Authorizing Provider  aspirin 81 MG tablet Take 81 mg by mouth daily.    Historical Provider, MD  atorvastatin (LIPITOR) 40 MG tablet Take 40 mg by mouth daily.     Historical Provider, MD  Calcium Carbonate-Vitamin D (CALCIUM + D PO) Take by mouth.    Historical Provider, MD  cholecalciferol (VITAMIN D) 400 UNITS TABS Take 400 Units by mouth.    Historical Provider, MD  diltiazem (CARTIA XT) 180 MG 24 hr capsule Take 240 mg by mouth daily.     Historical Provider, MD  doxazosin (CARDURA) 2 MG tablet Take 2 mg by mouth at bedtime.    Historical Provider, MD  fluticasone (FLONASE) 50 MCG/ACT nasal spray Place 1 spray into the nose 2 (two) times daily. 07/24/12   Billy Fischer, MD  Polyethyl Glycol-Propyl Glycol (  SYSTANE) 0.4-0.3 % SOLN Apply 1 drop to eye 4 (four) times daily as needed. 09/12/11   Melynda Ripple, MD  trimethoprim-polymyxin b (POLYTRIM) ophthalmic solution Place 1 drop into the left eye every 4 (four) hours. 08/09/12   Freeman Caldron Baker, PA-C  valsartan (DIOVAN) 160 MG tablet Take 160 mg by mouth daily.      Historical Provider, MD   BP 185/92 mmHg  Pulse 84  Temp(Src) 97.9 F (36.6 C) (Oral)  Resp 24  SpO2 98% Physical Exam  Constitutional: She is oriented to person, place, and time. She appears well-developed and well-nourished.  HENT:  Head: Normocephalic and atraumatic.  Eyes: EOM are normal. Pupils are equal, round, and reactive to light. Right eye exhibits no discharge. Left eye exhibits no discharge.  Neck: Normal range of motion. Neck supple. No JVD present.  Cardiovascular:  Normal rate, regular rhythm and normal heart sounds.   No murmur heard. Pulmonary/Chest: Effort normal and breath sounds normal. She has no wheezes. She has no rales. She exhibits no tenderness.  Abdominal: Soft. Bowel sounds are normal. She exhibits no distension and no mass. There is no tenderness.  Musculoskeletal: Normal range of motion. She exhibits no edema.  Lymphadenopathy:    She has no cervical adenopathy.  Neurological: She is alert and oriented to person, place, and time. No cranial nerve deficit. Coordination normal.  Skin: Skin is warm and dry. No rash noted.  Psychiatric: She has a normal mood and affect. Her behavior is normal. Judgment and thought content normal.  Nursing note and vitals reviewed.   ED Course  Procedures   DIAGNOSTIC STUDIES: Oxygen Saturation is 98% on RA, normal by my interpretation.    COORDINATION OF CARE: 11:14 PM Discussed treatment plan with pt at bedside and pt agreed to plan.  Labs Review Labs Reviewed  CBC - Abnormal; Notable for the following:    Hemoglobin 11.5 (*)    HCT 35.3 (*)    All other components within normal limits  BASIC METABOLIC PANEL - Abnormal; Notable for the following:    Glucose, Bld 115 (*)    BUN 42 (*)    Creatinine, Ser 2.20 (*)    GFR calc non Af Amer 22 (*)    GFR calc Af Amer 26 (*)    All other components within normal limits  BRAIN NATRIURETIC PEPTIDE  I-STAT TROPOININ, ED    Imaging Review Dg Chest 2 View  05/04/2014   CLINICAL DATA:  Acute onset of mid chest pressure. Initial encounter.  EXAM: CHEST  2 VIEW  COMPARISON:  None.  FINDINGS: The lungs are well-aerated and clear. There is no evidence of focal opacification, pleural effusion or pneumothorax.  The heart is normal in size; the mediastinal contour is within normal limits. No acute osseous abnormalities are seen. Mild degenerative heterotopic bone formation is noted about the right humeral head.  IMPRESSION: No acute cardiopulmonary process seen.    Electronically Signed   By: Garald Balding M.D.   On: 05/04/2014 23:29     EKG Interpretation   Date/Time:  Sunday May 04 2014 22:29:01 EDT Ventricular Rate:  76 PR Interval:  120 QRS Duration: 108 QT Interval:  382 QTC Calculation: 429 R Axis:   17 Text Interpretation:  Normal sinus rhythm Incomplete right bundle branch  block Left ventricular hypertrophy with repolarization abnormality Cannot  rule out Septal infarct , age undetermined Abnormal ECG When compared with  ECG of 10/16/2004, Incomplete right bundle branch block is now  Present Left  ventricular hypertrophy is now Present Confirmed by [facility]  MD, Ariaunna Longsworth  ([id number]) on 05/04/2014 11:06:45 PM      MDM   Final diagnoses:  Chest pain, unspecified chest pain type  Renal insufficiency  Normochromic normocytic anemia    Chest pain which has resolved. ECG is unremarkable. Although she does have cardiac risk factors of hypertension and hyperlipidemia, I feel that if that the troponin is normal that she can follow-up with PCP to consider outpatient stress testing in the near future.   Repeat troponin went from 0.00-0.01. At that point, it was felt that she would need to get the full six-hour troponin to make a treatment decision. Third troponin is unchanged from second troponin so she has felt to ruled out for ACS. Laboratory workup shows mild anemia as well as renal insufficiency. Anemia is probably related to the renal insufficiency. There has been a slight rise in creatinine, but last value on record was 3 years ago. She is discharged with instructions to make arrangements for stress testing through her PCP.  I personally performed the services described in this documentation, which was scribed in my presence. The recorded information has been reviewed and is accurate.    Delora Fuel, MD [id number]

## 2014-05-04 NOTE — ED Notes (Signed)
MD at bedside. 

## 2014-05-05 DIAGNOSIS — R079 Chest pain, unspecified: Secondary | ICD-10-CM | POA: Diagnosis not present

## 2014-05-05 LAB — BASIC METABOLIC PANEL
ANION GAP: 9 (ref 5–15)
BUN: 42 mg/dL — AB (ref 6–23)
CALCIUM: 10.4 mg/dL (ref 8.4–10.5)
CO2: 24 mmol/L (ref 19–32)
CREATININE: 2.2 mg/dL — AB (ref 0.50–1.10)
Chloride: 107 mmol/L (ref 96–112)
GFR calc non Af Amer: 22 mL/min — ABNORMAL LOW (ref >90)
GFR, EST AFRICAN AMERICAN: 26 mL/min — AB (ref >90)
GLUCOSE: 115 mg/dL — AB (ref 70–99)
Potassium: 4.1 mmol/L (ref 3.5–5.1)
Sodium: 140 mmol/L (ref 135–145)

## 2014-05-05 LAB — I-STAT TROPONIN, ED
TROPONIN I, POC: 0.01 ng/mL (ref 0.00–0.08)
TROPONIN I, POC: 0.01 ng/mL (ref 0.00–0.08)

## 2014-05-05 LAB — BRAIN NATRIURETIC PEPTIDE: B NATRIURETIC PEPTIDE 5: 19.9 pg/mL (ref 0.0–100.0)

## 2014-05-05 NOTE — Discharge Instructions (Signed)
Call your doctor to consider making arrangements for stress testing as an outpatient.  Chest Pain (Nonspecific) It is often hard to give a specific diagnosis for the cause of chest pain. There is always a chance that your pain could be related to something serious, such as a heart attack or a blood clot in the lungs. You need to follow up with your health care provider for further evaluation. CAUSES   Heartburn.  Pneumonia or bronchitis.  Anxiety or stress.  Inflammation around your heart (pericarditis) or lung (pleuritis or pleurisy).  A blood clot in the lung.  A collapsed lung (pneumothorax). It can develop suddenly on its own (spontaneous pneumothorax) or from trauma to the chest.  Shingles infection (herpes zoster virus). The chest wall is composed of bones, muscles, and cartilage. Any of these can be the source of the pain.  The bones can be bruised by injury.  The muscles or cartilage can be strained by coughing or overwork.  The cartilage can be affected by inflammation and become sore (costochondritis). DIAGNOSIS  Lab tests or other studies may be needed to find the cause of your pain. Your health care provider may have you take a test called an ambulatory electrocardiogram (ECG). An ECG records your heartbeat patterns over a 24-hour period. You may also have other tests, such as:  Transthoracic echocardiogram (TTE). During echocardiography, sound waves are used to evaluate how blood flows through your heart.  Transesophageal echocardiogram (TEE).  Cardiac monitoring. This allows your health care provider to monitor your heart rate and rhythm in real time.  Holter monitor. This is a portable device that records your heartbeat and can help diagnose heart arrhythmias. It allows your health care provider to track your heart activity for several days, if needed.  Stress tests by exercise or by giving medicine that makes the heart beat faster. TREATMENT   Treatment depends  on what may be causing your chest pain. Treatment may include:  Acid blockers for heartburn.  Anti-inflammatory medicine.  Pain medicine for inflammatory conditions.  Antibiotics if an infection is present.  You may be advised to change lifestyle habits. This includes stopping smoking and avoiding alcohol, caffeine, and chocolate.  You may be advised to keep your head raised (elevated) when sleeping. This reduces the chance of acid going backward from your stomach into your esophagus. Most of the time, nonspecific chest pain will improve within 2-3 days with rest and mild pain medicine.  HOME CARE INSTRUCTIONS   If antibiotics were prescribed, take them as directed. Finish them even if you start to feel better.  For the next few days, avoid physical activities that bring on chest pain. Continue physical activities as directed.  Do not use any tobacco products, including cigarettes, chewing tobacco, or electronic cigarettes.  Avoid drinking alcohol.  Only take medicine as directed by your health care provider.  Follow your health care provider's suggestions for further testing if your chest pain does not go away.  Keep any follow-up appointments you made. If you do not go to an appointment, you could develop lasting (chronic) problems with pain. If there is any problem keeping an appointment, call to reschedule. SEEK MEDICAL CARE IF:   Your chest pain does not go away, even after treatment.  You have a rash with blisters on your chest.  You have a fever. SEEK IMMEDIATE MEDICAL CARE IF:   You have increased chest pain or pain that spreads to your arm, neck, jaw, back, or abdomen.  You have shortness of breath.  You have an increasing cough, or you cough up blood.  You have severe back or abdominal pain.  You feel nauseous or vomit.  You have severe weakness.  You faint.  You have chills. This is an emergency. Do not wait to see if the pain will go away. Get medical  help at once. Call your local emergency services (911 in [place] Do not drive yourself to the hospital. MAKE SURE YOU:   Understand these instructions.  Will watch your condition.  Will get help right away if you are not doing well or get worse. Document Released: 11/03/2004 Document Revised: 01/29/2013 Document Reviewed: 08/30/2007 [facility] Patient Information 2015 [place], [place]. This information is not intended to replace advice given to you by your health care provider. Make sure you discuss any questions you have with your health care provider.

## 2014-11-19 ENCOUNTER — Other Ambulatory Visit: Payer: Self-pay | Admitting: Gastroenterology

## 2016-01-11 ENCOUNTER — Telehealth: Payer: Self-pay | Admitting: Oncology

## 2016-01-11 NOTE — Telephone Encounter (Signed)
12/24/15 - Called and left patient a message to give me a call regarding follow-up to the NSABP B-42 study. No return call. 01/05/16 - Called and left patient a message to give me a call regarding follow-up to the NSABP B-42 study. No return call. Karen Myers 01/11/16 - 11:04 am

## 2016-05-25 ENCOUNTER — Ambulatory Visit (HOSPITAL_COMMUNITY)
Admission: EM | Admit: 2016-05-25 | Discharge: 2016-05-25 | Disposition: A | Discharge status: Home / Self Care | Payer: Medicare Other | Attending: Family Medicine | Admitting: Family Medicine

## 2016-05-25 ENCOUNTER — Encounter (HOSPITAL_COMMUNITY): Payer: Self-pay | Admitting: Family Medicine

## 2016-05-25 DIAGNOSIS — R350 Frequency of micturition: Secondary | ICD-10-CM | POA: Diagnosis not present

## 2016-05-25 DIAGNOSIS — R102 Pelvic and perineal pain: Secondary | ICD-10-CM

## 2016-05-25 DIAGNOSIS — N3001 Acute cystitis with hematuria: Secondary | ICD-10-CM

## 2016-05-25 LAB — POCT URINALYSIS DIP (DEVICE)
Bilirubin Urine: NEGATIVE
Glucose, UA: NEGATIVE mg/dL
KETONES UR: NEGATIVE mg/dL
Nitrite: NEGATIVE
PH: 5.5 (ref 5.0–8.0)
Protein, ur: 100 mg/dL — AB
Specific Gravity, Urine: 1.02 (ref 1.005–1.030)
Urobilinogen, UA: 0.2 mg/dL (ref 0.0–1.0)

## 2016-05-25 MED ORDER — SULFAMETHOXAZOLE-TRIMETHOPRIM 800-160 MG PO TABS: 1.0000 | ORAL_TABLET | Freq: Two times a day (BID) | ORAL | 0 refills | Status: AC

## 2016-05-25 NOTE — ED Triage Notes (Signed)
This morning, noticed frequent urination and tingling sensation and noticed pink on tissue.  Feels pressure as well

## 2016-05-25 NOTE — ED Provider Notes (Signed)
[place]    CSN: [id number] Arrival date & time: 05/25/16  1552     History   Chief Complaint Chief Complaint  Patient presents with  . Urinary Tract Infection    HPI Karen Myers is a 71 y.o. female.   This is a 71 year old woman who presents to [facility] urgent care center for evaluation of urinary symptoms.  She has had frequency, pink on the tissue, and lower abdominal pressure since this morning.      Past Medical History:  Diagnosis Date  . Breast cancer ([place])   . Breast cancer ([place])   . CIN III (cervical intraepithelial neoplasia III)    prior to LEEP procedure  . High cholesterol   . Hypertension     Patient Active Problem List   Diagnosis Date Noted  . CIN 3 - cervical intraepithelial neoplasia grade 3 10/06/2011  . Breast CA ([place]) 12/02/2010  . Renal insufficiency 12/02/2010  . Hypertension 12/02/2010  . Hyperlipidemia 12/02/2010    Past Surgical History:  Procedure Laterality Date  . BREAST LUMPECTOMY    . CERVICAL BIOPSY  W/ LOOP ELECTRODE EXCISION  2005  . COLONOSCOPY    . HYSTEROSCOPY W/D&C  2005  . MASTECTOMY    . POLYPECTOMY    . RENAL BIOPSY    . VULVA /PERINEUM BIOPSY     nevus on vulva     OB History    Gravida Para Term Preterm AB Living   2 2       2    SAB TAB Ectopic Multiple Live Births                   Home Medications    Prior to Admission medications   Medication Sig Start Date End Date Taking? Authorizing Provider  aspirin 81 MG tablet Take 81 mg by mouth daily.    Historical Provider, MD  atorvastatin (LIPITOR) 40 MG tablet Take 40 mg by mouth daily.     Historical Provider, MD  Calcium Carbonate-Vitamin D (CALCIUM + D PO) Take by mouth.    Historical Provider, MD  cholecalciferol (VITAMIN D) 400 UNITS TABS Take 400 Units by mouth.    Historical Provider, MD  diltiazem (CARTIA XT) 180 MG 24 hr capsule Take 240 mg by mouth daily.     Historical Provider, MD  doxazosin  (CARDURA) 2 MG tablet Take 2 mg by mouth at bedtime.    Historical Provider, MD  sulfamethoxazole-trimethoprim (BACTRIM DS,SEPTRA DS) 800-160 MG tablet Take 1 tablet by mouth 2 (two) times daily. 05/25/16 06/01/16  Robyn Haber, MD  valsartan (DIOVAN) 160 MG tablet Take 160 mg by mouth daily.      Historical Provider, MD    Family History No family history on file.  Social History Social History  Substance Use Topics  . Smoking status: Former Research scientist (life sciences)  . Smokeless tobacco: Never Used  . Alcohol use No     Allergies   Altace [ramipril]   Review of Systems Review of Systems  Genitourinary: Positive for frequency, hematuria and pelvic pain.  All other systems reviewed and are negative.    Physical Exam Triage Vital Signs ED Triage Vitals  Enc Vitals Group     BP      Pulse      Resp      Temp      Temp src      SpO2      Weight  Height      Head Circumference      Peak Flow      Pain Score      Pain Loc      Pain Edu?      Excl. in [place]?    No data found.   Updated Vital Signs BP 134/84 (BP Location: Left Arm)   Pulse 79   Temp 98 F (36.7 C) (Oral)   Resp 18   SpO2 97%    Physical Exam  Constitutional: She is oriented to person, place, and time. She appears well-developed and well-nourished.  HENT:  Right Ear: External ear normal.  Left Ear: External ear normal.  Eyes: Conjunctivae and EOM are normal. Pupils are equal, round, and reactive to light.  Neck: Normal range of motion. Neck supple.  Pulmonary/Chest: Effort normal.  Musculoskeletal: Normal range of motion.  Neurological: She is alert and oriented to person, place, and time.  Skin: Skin is warm and dry.  Nursing note and vitals reviewed.    UC Treatments / Results  Labs (all labs ordered are listed, but only abnormal results are displayed) Labs Reviewed  POCT URINALYSIS DIP (DEVICE) - Abnormal; Notable for the following:       Result Value   Hgb urine dipstick LARGE (*)     Protein, ur 100 (*)    Leukocytes, UA LARGE (*)    All other components within normal limits    EKG  EKG Interpretation None       Radiology No results found.  Procedures Procedures (including critical care time)  Medications Ordered in UC Medications - No data to display   Initial Impression / Assessment and Plan / UC Course  I have reviewed the triage vital signs and the nursing notes.  Pertinent labs & imaging results that were available during my care of the patient were reviewed by me and considered in my medical decision making (see chart for details).     Final Clinical Impressions(s) / UC Diagnoses   Final diagnoses:  Acute cystitis with hematuria    New Prescriptions New Prescriptions   SULFAMETHOXAZOLE-TRIMETHOPRIM (BACTRIM DS,SEPTRA DS) 800-160 MG TABLET    Take 1 tablet by mouth 2 (two) times daily.     Robyn Haber, MD 05/25/16 [phone]

## 2016-08-29 ENCOUNTER — Encounter (HOSPITAL_COMMUNITY): Payer: Self-pay | Admitting: Emergency Medicine

## 2016-08-29 ENCOUNTER — Ambulatory Visit (HOSPITAL_COMMUNITY)
Admission: EM | Admit: 2016-08-29 | Discharge: 2016-08-29 | Disposition: A | Discharge status: Home / Self Care | Payer: Medicare Other | Attending: Internal Medicine | Admitting: Internal Medicine

## 2016-08-29 DIAGNOSIS — H1032 Unspecified acute conjunctivitis, left eye: Secondary | ICD-10-CM

## 2016-08-29 MED ORDER — POLYMYXIN B-TRIMETHOPRIM 10000-0.1 UNIT/ML-% OP SOLN: 1.0000 [drp] | OPHTHALMIC | 0 refills | Status: DC

## 2016-08-29 NOTE — Discharge Instructions (Signed)
Use the eyedrops as directed and also use the eyedrops called Zaditor, one drop in left eye twice a day for redness and inflammation. If worse follow-up with the ophthalmologist listed on this page.

## 2016-08-29 NOTE — ED Provider Notes (Signed)
CSN: [id number]     Arrival date & time 08/29/16  1536 History   First MD Initiated Contact with Patient 08/29/16 1659     Chief Complaint  Patient presents with  . Eye Problem   (Consider location/radiation/quality/duration/timing/severity/associated sxs/prior Treatment) 71 year old female states that this morning she awoke with some irritation of the left eye. She has had some clear watery drainage. She notes that there is some redness primarily to the lower eyelid. She mentions that she was putting on some eye makeup yesterday and may have gotten a portion of it in her eye. Her vision is normal except when watery it is a little blurry. No pain or burning.      Past Medical History:  Diagnosis Date  . Breast cancer ([place])   . Breast cancer ([place])   . CIN III (cervical intraepithelial neoplasia III)    prior to LEEP procedure  . High cholesterol   . Hypertension    Past Surgical History:  Procedure Laterality Date  . BREAST LUMPECTOMY    . CERVICAL BIOPSY  W/ LOOP ELECTRODE EXCISION  2005  . COLONOSCOPY    . HYSTEROSCOPY W/D&C  2005  . MASTECTOMY    . POLYPECTOMY    . RENAL BIOPSY    . VULVA /PERINEUM BIOPSY     nevus on vulva    History reviewed. No pertinent family history. Social History  Substance Use Topics  . Smoking status: Former Research scientist (life sciences)  . Smokeless tobacco: Never Used  . Alcohol use No   OB History    Gravida Para Term Preterm AB Living   2 2       2    SAB TAB Ectopic Multiple Live Births                 Review of Systems  Constitutional: Negative.   HENT: Negative.   Eyes: Positive for discharge and redness. Negative for pain.  Respiratory: Negative.   All other systems reviewed and are negative.   Allergies  Altace [ramipril]  Home Medications   Prior to Admission medications   Medication Sig Start Date End Date Taking? Authorizing Provider  aspirin 81 MG tablet Take 81 mg by mouth daily.   Yes [provider]  atorvastatin (LIPITOR)  40 MG tablet Take 40 mg by mouth daily.    Yes [provider]  diltiazem (CARTIA XT) 180 MG 24 hr capsule Take 240 mg by mouth daily.    Yes [provider]  losartan (COZAAR) 100 MG tablet Take 100 mg by mouth daily.   Yes [provider]  Calcium Carbonate-Vitamin D (CALCIUM + D PO) Take by mouth.    [provider]  cholecalciferol (VITAMIN D) 400 UNITS TABS Take 400 Units by mouth.    [provider]  doxazosin (CARDURA) 2 MG tablet Take 2 mg by mouth at bedtime.    [provider]  trimethoprim-polymyxin b (POLYTRIM) ophthalmic solution Place 1 drop into the left eye every 4 (four) hours. For 5 days 08/29/16   Janne Napoleon, NP  valsartan (DIOVAN) 160 MG tablet Take 160 mg by mouth daily.      [provider]   Meds Ordered and Administered this Visit  Medications - No data to display  BP 130/83 (BP Location: Left Arm)   Pulse 74   Temp 97.9 F (36.6 C) (Oral)   Resp 20   SpO2 96%  No data found.   Physical Exam  Constitutional: She is oriented to  person, place, and time. She appears well-developed and well-nourished. No distress.  HENT:  Head: Normocephalic and atraumatic.  Eyes: Pupils are equal, round, and reactive to light. EOM are normal.  Sclera with minor injection. Conjunctiva with mild swelling and erythema. The lower conjunctiva also has small points of brown soft papules, some of them draining. Sclera with minor injection. Anterior chamber is clear. Arcus senilis.  Neck: Normal range of motion. Neck supple.  Pulmonary/Chest: Effort normal.  Neurological: She is alert and oriented to person, place, and time.  Skin: Skin is warm and dry.  Nursing note and vitals reviewed.   Urgent Care Course     Procedures (including critical care time)  Labs Review Labs Reviewed - No data to display  Imaging Review No results found.   Visual Acuity Review  Right Eye Distance:   Left Eye Distance:   Bilateral  Distance:    Right Eye Near:   Left Eye Near:    Bilateral Near:         MDM   1. Acute conjunctivitis of left eye, unspecified acute conjunctivitis type    Use the eyedrops as directed and also use the eyedrops called Zaditor, one drop in left eye twice a day for redness and inflammation. If worse follow-up with the ophthalmologist listed on this page. Meds ordered this encounter  Medications  . losartan (COZAAR) 100 MG tablet    Sig: Take 100 mg by mouth daily.  Marland Kitchen trimethoprim-polymyxin b (POLYTRIM) ophthalmic solution    Sig: Place 1 drop into the left eye every 4 (four) hours. For 5 days    Dispense:  10 mL    Refill:  0    Order Specific Question:   Supervising Provider    Answer:   Jacklynn Ganong [[id number]]       Janne Napoleon, NP 08/29/16 1719

## 2016-08-29 NOTE — ED Triage Notes (Signed)
Pt here for possible left pink eye onset last night   Sx today include water eye and irritation  Denies inj/trauma, blurred vision  A&O x4... NAD... Ambulatory

## 2017-01-26 ENCOUNTER — Other Ambulatory Visit (HOSPITAL_COMMUNITY): Payer: Self-pay | Admitting: Surgery

## 2017-01-26 DIAGNOSIS — E210 Primary hyperparathyroidism: Secondary | ICD-10-CM

## 2017-02-09 ENCOUNTER — Ambulatory Visit (HOSPITAL_COMMUNITY)
Admission: EM | Admit: 2017-02-09 | Discharge: 2017-02-09 | Disposition: A | Discharge status: Home / Self Care | Payer: Medicare Other | Attending: Family Medicine | Admitting: Family Medicine

## 2017-02-09 ENCOUNTER — Encounter (HOSPITAL_COMMUNITY): Payer: Self-pay | Admitting: Family Medicine

## 2017-02-09 ENCOUNTER — Ambulatory Visit (HOSPITAL_COMMUNITY)
Admit: 2017-02-09 | Discharge: 2017-02-09 | Disposition: A | Discharge status: Home / Self Care | Payer: Medicare Other | Source: Ambulatory Visit | Attending: Physician Assistant | Admitting: Physician Assistant

## 2017-02-09 DIAGNOSIS — M79609 Pain in unspecified limb: Secondary | ICD-10-CM | POA: Diagnosis not present

## 2017-02-09 DIAGNOSIS — Z859 Personal history of malignant neoplasm, unspecified: Secondary | ICD-10-CM | POA: Diagnosis not present

## 2017-02-09 DIAGNOSIS — M79661 Pain in right lower leg: Secondary | ICD-10-CM | POA: Insufficient documentation

## 2017-02-09 MED ORDER — CYCLOBENZAPRINE HCL 5 MG PO TABS: 5.0000 mg | ORAL_TABLET | Freq: Every day | ORAL | 0 refills | Status: DC

## 2017-02-09 NOTE — Progress Notes (Signed)
Right lower extremity venous duplex completed. No evidence of DVT, superficial thrombosis, or Baker's cyst. Toma Copier, RVS 02/09/2017 7:17 PM

## 2017-02-09 NOTE — ED Triage Notes (Signed)
Pt here for pain in right calf area. sts more painful in night and when she sits. sts x 5 day and using deep freeze on it.

## 2017-02-09 NOTE — Discharge Instructions (Signed)
Go to the emergency department, let the office know that you are there for an outpatient ultrasound for DVT. The radiology tech will find you at the emergency department.

## 2017-02-09 NOTE — ED Provider Notes (Signed)
[place]    CSN: [id number] Arrival date & time: 02/09/17  1545     History   Chief Complaint Chief Complaint  Patient presents with  . Leg Pain    HPI Karen Myers is a 72 y.o. female.   72 year old female with history of breast cancer, CIN-3, HLD, HTN, comes in for 5-day history of right calf pain.  States that pain is  worse at night, with a throbbing sensation.  States she has recent increase in activity going up and down stairs.  She has also had recent long travels, with driving from [place] back to [place].  She denies any leg swelling, erythema, increased warmth.  Denies fever, chills, night sweats.  Denies chest pain, shortness of breath, weakness, dizziness.  She has been using topical anesthetics with little relief.  Has not taken anything for the pain.      Past Medical History:  Diagnosis Date  . Breast cancer ([place])   . Breast cancer ([place])   . CIN III (cervical intraepithelial neoplasia III)    prior to LEEP procedure  . High cholesterol   . Hypertension     Patient Active Problem List   Diagnosis Date Noted  . CIN 3 - cervical intraepithelial neoplasia grade 3 10/06/2011  . Breast CA ([place]) 12/02/2010  . Renal insufficiency 12/02/2010  . Hypertension 12/02/2010  . Hyperlipidemia 12/02/2010    Past Surgical History:  Procedure Laterality Date  . BREAST LUMPECTOMY    . CERVICAL BIOPSY  W/ LOOP ELECTRODE EXCISION  2005  . COLONOSCOPY    . HYSTEROSCOPY W/D&C  2005  . MASTECTOMY    . POLYPECTOMY    . RENAL BIOPSY    . VULVA /PERINEUM BIOPSY     nevus on vulva     OB History    Gravida Para Term Preterm AB Living   2 2       2    SAB TAB Ectopic Multiple Live Births                   Home Medications    Prior to Admission medications   Medication Sig Start Date End Date Taking? Authorizing Provider  aspirin 81 MG tablet Take 81 mg by mouth daily.    [provider]  atorvastatin (LIPITOR) 40 MG tablet  Take 40 mg by mouth daily.     [provider]  cyclobenzaprine (FLEXERIL) 5 MG tablet Take 1 tablet (5 mg total) by mouth at bedtime. 02/09/17   Tasia Catchings, Amy V, PA-C  diltiazem (CARTIA XT) 180 MG 24 hr capsule Take 240 mg by mouth daily.     [provider]  losartan (COZAAR) 100 MG tablet Take 100 mg by mouth daily.    [provider]    Family History History reviewed. No pertinent family history.  Social History Social History   Tobacco Use  . Smoking status: Former Research scientist (life sciences)  . Smokeless tobacco: Never Used  Substance Use Topics  . Alcohol use: No  . Drug use: No     Allergies   Altace [ramipril]   Review of Systems Review of Systems  Reason unable to perform ROS: See HPI as above.     Physical Exam Triage Vital Signs ED Triage Vitals  Enc Vitals Group     BP 02/09/17 1655 (!) 148/86     Pulse Rate 02/09/17 1655 69     Resp 02/09/17 1655 18     Temp 02/09/17 1655  98.5 F (36.9 C)     Temp src --      SpO2 02/09/17 1655 100 %     Weight --      Height --      Head Circumference --      Peak Flow --      Pain Score 02/09/17 1653 7     Pain Loc --      Pain Edu? --      Excl. in [place]? --    No data found.  Updated Vital Signs BP (!) 148/86   Pulse 69   Temp 98.5 F (36.9 C)   Resp 18   SpO2 100%   Physical Exam  Constitutional: She is oriented to person, place, and time. She appears well-developed and well-nourished. No distress.  HENT:  Head: Normocephalic and atraumatic.  Eyes: Conjunctivae are normal. Pupils are equal, round, and reactive to light.  Neck: Normal range of motion. Neck supple.  Cardiovascular: Normal rate, regular rhythm and normal heart sounds. Exam reveals no gallop and no friction rub.  No murmur heard. Pulmonary/Chest: Effort normal and breath sounds normal. She has no wheezes. She has no rales.  Musculoskeletal:  No obvious swelling of the right calf/leg. No erythema, increased warmth. Tenderness to  palpation of right calf without obvious cords. Full ROM of knee/ankle. Strength normal and equal bilaterally. Sensation intact and equal bilaterally.   Neurological: She is alert and oriented to person, place, and time.  Skin: Skin is warm and dry.     UC Treatments / Results  Labs (all labs ordered are listed, but only abnormal results are displayed) Labs Reviewed - No data to display  EKG  EKG Interpretation None       Radiology No results found.  Procedures Procedures (including critical care time)  Medications Ordered in UC Medications - No data to display   Initial Impression / Assessment and Plan / UC Course  I have reviewed the triage vital signs and the nursing notes.  Pertinent labs & imaging results that were available during my care of the patient were reviewed by me and considered in my medical decision making (see chart for details).    Patient sent for vascular ultrasound to rule out DVT given history of cancer, recent long travel, with right calf pain.  Vascular ultrasound negative for DVT.  Patient to take Tylenol and muscle relaxant for possible muscle strain.  Follow-up with PCP for further evaluation and treatment needed.  Return precautions given.  Patient expresses understanding and agrees to plan.  Final Clinical Impressions(s) / UC Diagnoses   Final diagnoses:  Right calf pain    ED Discharge Orders        Ordered    cyclobenzaprine (FLEXERIL) 5 MG tablet  Daily at bedtime     02/09/17 1903    VAS [place] LOWER EXTREMITY VENOUS (DVT)     02/09/17 1742       Ok Edwards, PA-C 02/09/17 2000

## 2017-02-13 ENCOUNTER — Encounter (HOSPITAL_COMMUNITY)
Admission: RE | Admit: 2017-02-13 | Discharge: 2017-02-13 | Disposition: A | Discharge status: Home / Self Care | Payer: Medicare Other | Source: Ambulatory Visit | Attending: Surgery | Admitting: Surgery

## 2017-02-13 DIAGNOSIS — E210 Primary hyperparathyroidism: Secondary | ICD-10-CM | POA: Diagnosis not present

## 2017-02-13 MED ORDER — TECHNETIUM TC 99M SESTAMIBI GENERIC - CARDIOLITE
26.7000 | Freq: Once | INTRAVENOUS | Status: AC | PRN
  Administered 2017-02-13: 26.7 via INTRAVENOUS

## 2017-04-05 ENCOUNTER — Ambulatory Visit: Payer: Self-pay | Admitting: Surgery

## 2017-05-27 ENCOUNTER — Encounter: Payer: Self-pay | Admitting: Surgery

## 2017-05-27 DIAGNOSIS — E210 Primary hyperparathyroidism: Secondary | ICD-10-CM | POA: Diagnosis present

## 2017-05-27 NOTE — H&P (Signed)
General Surgery [facility]  Karen Myers DOB: 1945-06-30 Married / Language: English / Race: Black or African American Female  History of Present Illness  The patient is a 72 year old female who presents with primary hyperparathyroidism.  CC: primary hyperparathyroidism  Patient returns for follow-up and discussion of her test results for primary hyperparathyroidism. At my request the patient underwent a 24-hour urine collection for calcium with the result of 46. 25-hydroxy vitamin D level was low at 15. Patient underwent a nuclear medicine parathyroid scan on February 13, 2017. This localizes persistent uptake to the left inferior position consistent with parathyroid adenoma. Patient returns today to discuss these results and to discuss my recommendations for minimally invasive parathyroidectomy.  Allergies No Known Drug Allergies [01/24/2017]:  Medication History  Cyclobenzaprine HCl (5MG  Tablet, Oral as needed) Active. Atorvastatin Calcium (40MG  Tablet, Oral) Active. DilTIAZem HCl ER Beads (240MG  Capsule ER 24HR, Oral) Active. Losartan Potassium (100MG  Tablet, Oral) Active. Aspirin (81MG  Tablet, Oral) Active. Medications Reconciled  Vitals  Weight: 172.8 lb Height: 67in Body Surface Area: 1.9 m Body Mass Index: 27.06 kg/m  Temp.: 97.70F(Temporal)  Pulse: 108 (Regular)  BP: 146/88 (Sitting, Left Arm, Standard)   Physical Exam   See vital signs recorded above  GENERAL APPEARANCE Development: normal Nutritional status: normal Gross deformities: none  SKIN Rash, lesions, ulcers: none Induration, erythema: none Nodules: none palpable  EYES Conjunctiva and lids: normal Pupils: equal and reactive Iris: normal bilaterally  EARS, NOSE, MOUTH, THROAT External ears: no lesion or deformity External nose: no lesion or deformity Hearing: grossly normal Lips: no lesion or deformity Dentition: normal for age Oral  mucosa: moist  NECK Symmetric: yes Trachea: midline Thyroid: no palpable nodules in the thyroid bed  CHEST Respiratory effort: normal Retraction or accessory muscle use: no Breath sounds: normal bilaterally Rales, rhonchi, wheeze: none  CARDIOVASCULAR Auscultation: regular rhythm, normal rate Murmurs: none Pulses: carotid and radial pulse 2+ palpable Lower extremity edema: none Lower extremity varicosities: none  MUSCULOSKELETAL Station and gait: normal Digits and nails: no clubbing or cyanosis Muscle strength: grossly normal all extremities Range of motion: grossly normal all extremities Deformity: none  LYMPHATIC Cervical: none palpable Supraclavicular: none palpable  PSYCHIATRIC Oriented to person, place, and time: yes Mood and affect: normal for situation Judgment and insight: appropriate for situation    Assessment & Plan   PRIMARY HYPERPARATHYROIDISM (E21.0) CHRONIC KIDNEY DISEASE, STAGE 4 (SEVERE) (N18.4)  Patient returns to discuss laboratory studies and nuclear medicine parathyroid scan results. These point to primary hyperparathyroidism with a parathyroid adenoma at the left inferior position. There is approximately a 95% chance of single gland disease. I have recommended minimally invasive parathyroidectomy as an outpatient surgical procedure based on these findings.  Patient and I discussed minimally invasive parathyroidectomy versus the traditional neck exploration and identification of 4 parathyroid glands. We discussed the pros and cons of these different procedures. Patient agrees with the minimally invasive approach. We discussed the risk and benefits including the potential for recurrent laryngeal nerve injury and the potential for additional surgery at some point in the future.  Patient is scheduled to see her endocrinologist on Monday. She would like to review these results and to discuss this with him prior to making a final decision  regarding surgery. She will contact our office if she would like to proceed with scheduling her procedure sometime later this spring.  The risks and benefits of the procedure have been discussed at length with the patient. The  patient understands the proposed procedure, potential alternative treatments, and the course of recovery to be expected. All of the patient's questions have been answered at this time. The patient wishes to proceed with surgery.  Armandina Gemma, [facility] Office: [phone]

## 2017-05-29 NOTE — Patient Instructions (Signed)
Karen Myers  05/29/2017   Your procedure is scheduled on: 06/01/2017   Report to [facility] Main  Entrance  Report to admitting at   [place] AM    Call this number if you have problems the morning of surgery [phone]   Remember: Do not eat food or drink liquids :After Midnight.     Take these medicines the morning of surgery with A SIP OF WATER: Cardizem ( Diltiazem)                                You may not have any metal on your body including hair pins and              piercings  Do not wear jewelry, make-up, lotions, powders or perfumes, deodorant             Do not wear nail polish.  Do not shave  48 hours prior to surgery.     Do not bring valuables to the hospital. [place].  Contacts, dentures or bridgework may not be worn into surgery. .     Patients discharged the day of surgery will not be allowed to drive home.  Name and phone number of your driver:                Please read over the following fact sheets you were given: _____________________________________________________________________             [facility] - Preparing for Surgery Before surgery, you can play an important role.  Because skin is not sterile, your skin needs to be as free of germs as possible.  You can reduce the number of germs on your skin by washing with CHG (chlorahexidine gluconate) soap before surgery.  CHG is an antiseptic cleaner which kills germs and bonds with the skin to continue killing germs even after washing. Please DO NOT use if you have an allergy to CHG or antibacterial soaps.  If your skin becomes reddened/irritated stop using the CHG and inform your nurse when you arrive at Short Stay. Do not shave (including legs and underarms) for at least 48 hours prior to the first CHG shower.  You may shave your face/neck. Please follow these instructions carefully:  1.  Shower with CHG Soap the night  before surgery and the  morning of Surgery.  2.  If you choose to wash your hair, wash your hair first as usual with your  normal  shampoo.  3.  After you shampoo, rinse your hair and body thoroughly to remove the  shampoo.                           4.  Use CHG as you would any other liquid soap.  You can apply chg directly  to the skin and wash                       Gently with a scrungie or clean washcloth.  5.  Apply the CHG Soap to your body ONLY FROM THE NECK DOWN.   Do not use on face/ open  Wound or open sores. Avoid contact with eyes, ears mouth and genitals (private parts).                       Wash face,  Genitals (private parts) with your normal soap.             6.  Wash thoroughly, paying special attention to the area where your surgery  will be performed.  7.  Thoroughly rinse your body with warm water from the neck down.  8.  DO NOT shower/wash with your normal soap after using and rinsing off  the CHG Soap.                9.  Pat yourself dry with a clean towel.            10.  Wear clean pajamas.            11.  Place clean sheets on your bed the night of your first shower and do not  sleep with pets. Day of Surgery : Do not apply any lotions/deodorants the morning of surgery.  Please wear clean clothes to the hospital/surgery center.  FAILURE TO FOLLOW THESE INSTRUCTIONS MAY RESULT IN THE CANCELLATION OF YOUR SURGERY PATIENT SIGNATURE_________________________________  NURSE SIGNATURE__________________________________  ________________________________________________________________________

## 2017-05-31 ENCOUNTER — Encounter (HOSPITAL_COMMUNITY)
Admission: RE | Admit: 2017-05-31 | Discharge: 2017-05-31 | Disposition: A | Discharge status: Home / Self Care | Payer: Medicare Other | Source: Ambulatory Visit | Attending: Surgery | Admitting: Surgery

## 2017-05-31 ENCOUNTER — Encounter (HOSPITAL_COMMUNITY): Payer: Self-pay

## 2017-05-31 ENCOUNTER — Ambulatory Visit (HOSPITAL_COMMUNITY)
Admission: RE | Admit: 2017-05-31 | Discharge: 2017-05-31 | Disposition: A | Discharge status: Home / Self Care | Payer: Medicare Other | Source: Ambulatory Visit | Attending: Anesthesiology | Admitting: Anesthesiology

## 2017-05-31 ENCOUNTER — Other Ambulatory Visit: Payer: Self-pay

## 2017-05-31 DIAGNOSIS — Z01818 Encounter for other preprocedural examination: Secondary | ICD-10-CM | POA: Diagnosis not present

## 2017-05-31 DIAGNOSIS — I517 Cardiomegaly: Secondary | ICD-10-CM | POA: Insufficient documentation

## 2017-05-31 DIAGNOSIS — Z01812 Encounter for preprocedural laboratory examination: Secondary | ICD-10-CM | POA: Diagnosis present

## 2017-05-31 DIAGNOSIS — R9431 Abnormal electrocardiogram [ECG] [EKG]: Secondary | ICD-10-CM | POA: Insufficient documentation

## 2017-05-31 DIAGNOSIS — I700 Atherosclerosis of aorta: Secondary | ICD-10-CM | POA: Diagnosis not present

## 2017-05-31 DIAGNOSIS — E213 Hyperparathyroidism, unspecified: Secondary | ICD-10-CM | POA: Insufficient documentation

## 2017-05-31 DIAGNOSIS — Z01810 Encounter for preprocedural cardiovascular examination: Secondary | ICD-10-CM | POA: Diagnosis present

## 2017-05-31 DIAGNOSIS — I4510 Unspecified right bundle-branch block: Secondary | ICD-10-CM | POA: Insufficient documentation

## 2017-05-31 HISTORY — DX: Unspecified osteoarthritis, unspecified site: M19.90

## 2017-05-31 HISTORY — DX: Chronic kidney disease, unspecified: N18.9

## 2017-05-31 HISTORY — DX: Gastro-esophageal reflux disease without esophagitis: K21.9

## 2017-05-31 LAB — BASIC METABOLIC PANEL
Anion gap: 8 (ref 5–15)
BUN: 51 mg/dL — AB (ref 6–20)
CHLORIDE: 110 mmol/L (ref 101–111)
CO2: 19 mmol/L — ABNORMAL LOW (ref 22–32)
CREATININE: 3.24 mg/dL — AB (ref 0.44–1.00)
Calcium: 10.1 mg/dL (ref 8.9–10.3)
GFR calc Af Amer: 16 mL/min — ABNORMAL LOW (ref >60)
GFR calc non Af Amer: 14 mL/min — ABNORMAL LOW (ref >60)
GLUCOSE: 107 mg/dL — AB (ref 65–99)
Potassium: 4.9 mmol/L (ref 3.5–5.1)
SODIUM: 137 mmol/L (ref 135–145)

## 2017-05-31 LAB — CBC
HCT: 37.1 % (ref 36.0–46.0)
Hemoglobin: 11.6 g/dL — ABNORMAL LOW (ref 12.0–15.0)
MCH: 28.3 pg (ref 26.0–34.0)
MCHC: 31.3 g/dL (ref 30.0–36.0)
MCV: 90.5 fL (ref 78.0–100.0)
PLATELETS: 321 10*3/uL (ref 150–400)
RBC: 4.1 MIL/uL (ref 3.87–5.11)
RDW: 14.2 % (ref 11.5–15.5)
WBC: 7.2 10*3/uL (ref 4.0–10.5)

## 2017-05-31 NOTE — Progress Notes (Signed)
CXR done 05/31/2017 and BMP results from 05/31/2017 faxed via epic to Dr Harlow Asa.

## 2017-06-01 ENCOUNTER — Ambulatory Visit (HOSPITAL_COMMUNITY): Payer: Medicare Other | Admitting: Certified Registered"

## 2017-06-01 ENCOUNTER — Encounter (HOSPITAL_COMMUNITY)
Admission: RE | Disposition: A | Discharge status: Home / Self Care | Payer: Self-pay | Source: Ambulatory Visit | Attending: Surgery

## 2017-06-01 ENCOUNTER — Ambulatory Visit (HOSPITAL_COMMUNITY)
Admission: RE | Admit: 2017-06-01 | Discharge: 2017-06-01 | Disposition: A | Discharge status: Home / Self Care | Payer: Medicare Other | Source: Ambulatory Visit | Attending: Surgery | Admitting: Surgery

## 2017-06-01 ENCOUNTER — Encounter (HOSPITAL_COMMUNITY): Payer: Self-pay | Admitting: Emergency Medicine

## 2017-06-01 DIAGNOSIS — Z7982 Long term (current) use of aspirin: Secondary | ICD-10-CM | POA: Insufficient documentation

## 2017-06-01 DIAGNOSIS — I129 Hypertensive chronic kidney disease with stage 1 through stage 4 chronic kidney disease, or unspecified chronic kidney disease: Secondary | ICD-10-CM | POA: Insufficient documentation

## 2017-06-01 DIAGNOSIS — E210 Primary hyperparathyroidism: Secondary | ICD-10-CM | POA: Diagnosis not present

## 2017-06-01 DIAGNOSIS — Z79899 Other long term (current) drug therapy: Secondary | ICD-10-CM | POA: Diagnosis not present

## 2017-06-01 DIAGNOSIS — I4510 Unspecified right bundle-branch block: Secondary | ICD-10-CM | POA: Insufficient documentation

## 2017-06-01 DIAGNOSIS — Z87891 Personal history of nicotine dependence: Secondary | ICD-10-CM | POA: Diagnosis not present

## 2017-06-01 DIAGNOSIS — N184 Chronic kidney disease, stage 4 (severe): Secondary | ICD-10-CM | POA: Diagnosis not present

## 2017-06-01 DIAGNOSIS — E785 Hyperlipidemia, unspecified: Secondary | ICD-10-CM | POA: Diagnosis not present

## 2017-06-01 HISTORY — PX: PARATHYROIDECTOMY: SHX19

## 2017-06-01 SURGERY — PARATHYROIDECTOMY
Anesthesia: General | Site: Neck

## 2017-06-01 MED ORDER — BUPIVACAINE HCL 0.25 % IJ SOLN
INTRAMUSCULAR | Status: AC
  Filled 2017-06-01: qty 1

## 2017-06-01 MED ORDER — FENTANYL CITRATE (PF) 100 MCG/2ML IJ SOLN: 25.0000 ug | INTRAMUSCULAR | Status: DC | PRN

## 2017-06-01 MED ORDER — PHENYLEPHRINE 40 MCG/ML (10ML) SYRINGE FOR IV PUSH (FOR BLOOD PRESSURE SUPPORT)
PREFILLED_SYRINGE | INTRAVENOUS | Status: AC
  Filled 2017-06-01: qty 10

## 2017-06-01 MED ORDER — FENTANYL CITRATE (PF) 250 MCG/5ML IJ SOLN
INTRAMUSCULAR | Status: AC
  Filled 2017-06-01: qty 5

## 2017-06-01 MED ORDER — PROPOFOL 10 MG/ML IV BOLUS
INTRAVENOUS | Status: AC
  Filled 2017-06-01: qty 40

## 2017-06-01 MED ORDER — ONDANSETRON HCL 4 MG/2ML IJ SOLN
INTRAMUSCULAR | Status: DC | PRN
  Administered 2017-06-01: 4 mg via INTRAVENOUS

## 2017-06-01 MED ORDER — ONDANSETRON HCL 4 MG/2ML IJ SOLN
INTRAMUSCULAR | Status: AC
  Filled 2017-06-01: qty 2

## 2017-06-01 MED ORDER — ONDANSETRON HCL 4 MG/2ML IJ SOLN: 4.0000 mg | Freq: Once | INTRAMUSCULAR | Status: DC | PRN

## 2017-06-01 MED ORDER — CHLORHEXIDINE GLUCONATE CLOTH 2 % EX PADS: 6.0000 | MEDICATED_PAD | Freq: Once | CUTANEOUS | Status: DC

## 2017-06-01 MED ORDER — SODIUM CHLORIDE 0.9 % IV SOLN
INTRAVENOUS | Status: DC
  Administered 2017-06-01: 07:00:00 via INTRAVENOUS

## 2017-06-01 MED ORDER — LACTATED RINGERS IV SOLN
INTRAVENOUS | Status: DC | PRN
  Administered 2017-06-01: 07:00:00 via INTRAVENOUS

## 2017-06-01 MED ORDER — 0.9 % SODIUM CHLORIDE (POUR BTL) OPTIME
TOPICAL | Status: DC | PRN
  Administered 2017-06-01: 1000 mL

## 2017-06-01 MED ORDER — ROCURONIUM BROMIDE 10 MG/ML (PF) SYRINGE
PREFILLED_SYRINGE | INTRAVENOUS | Status: AC
  Filled 2017-06-01: qty 5

## 2017-06-01 MED ORDER — LABETALOL HCL 5 MG/ML IV SOLN
INTRAVENOUS | Status: DC | PRN
  Administered 2017-06-01: 10 mg via INTRAVENOUS

## 2017-06-01 MED ORDER — LIDOCAINE 2% (20 MG/ML) 5 ML SYRINGE
INTRAMUSCULAR | Status: AC
  Filled 2017-06-01: qty 10

## 2017-06-01 MED ORDER — TRAMADOL HCL 50 MG PO TABS: 50.0000 mg | ORAL_TABLET | Freq: Four times a day (QID) | ORAL | 0 refills | Status: DC | PRN

## 2017-06-01 MED ORDER — MIDAZOLAM HCL 2 MG/2ML IJ SOLN
INTRAMUSCULAR | Status: DC | PRN
  Administered 2017-06-01: 2 mg via INTRAVENOUS

## 2017-06-01 MED ORDER — BUPIVACAINE HCL (PF) 0.25 % IJ SOLN
INTRAMUSCULAR | Status: AC
  Filled 2017-06-01: qty 30

## 2017-06-01 MED ORDER — PROPOFOL 10 MG/ML IV BOLUS
INTRAVENOUS | Status: DC | PRN
  Administered 2017-06-01: 150 mg via INTRAVENOUS

## 2017-06-01 MED ORDER — DEXAMETHASONE SODIUM PHOSPHATE 10 MG/ML IJ SOLN
INTRAMUSCULAR | Status: DC | PRN
  Administered 2017-06-01: 10 mg via INTRAVENOUS

## 2017-06-01 MED ORDER — ROCURONIUM BROMIDE 10 MG/ML (PF) SYRINGE
PREFILLED_SYRINGE | INTRAVENOUS | Status: DC | PRN
  Administered 2017-06-01: 50 mg via INTRAVENOUS

## 2017-06-01 MED ORDER — SUGAMMADEX SODIUM 200 MG/2ML IV SOLN
INTRAVENOUS | Status: DC | PRN
  Administered 2017-06-01: 160 mg via INTRAVENOUS

## 2017-06-01 MED ORDER — FENTANYL CITRATE (PF) 250 MCG/5ML IJ SOLN
INTRAMUSCULAR | Status: DC | PRN
  Administered 2017-06-01: 100 ug via INTRAVENOUS
  Administered 2017-06-01: 50 ug via INTRAVENOUS

## 2017-06-01 MED ORDER — BUPIVACAINE HCL 0.25 % IJ SOLN
INTRAMUSCULAR | Status: DC | PRN
  Administered 2017-06-01: 10 mL

## 2017-06-01 MED ORDER — LACTATED RINGERS IV SOLN: INTRAVENOUS | Status: DC

## 2017-06-01 MED ORDER — CEFAZOLIN SODIUM-DEXTROSE 2-4 GM/100ML-% IV SOLN
2.0000 g | INTRAVENOUS | Status: AC
  Administered 2017-06-01: 2 g via INTRAVENOUS
  Filled 2017-06-01: qty 100

## 2017-06-01 MED ORDER — MIDAZOLAM HCL 2 MG/2ML IJ SOLN
INTRAMUSCULAR | Status: AC
  Filled 2017-06-01: qty 2

## 2017-06-01 MED ORDER — DEXAMETHASONE SODIUM PHOSPHATE 10 MG/ML IJ SOLN
INTRAMUSCULAR | Status: AC
  Filled 2017-06-01: qty 1

## 2017-06-01 MED ORDER — LIDOCAINE 2% (20 MG/ML) 5 ML SYRINGE
INTRAMUSCULAR | Status: DC | PRN
  Administered 2017-06-01: 60 mg via INTRAVENOUS

## 2017-06-01 SURGICAL SUPPLY — 39 items
ADH SKN CLS APL DERMABOND .7 (GAUZE/BANDAGES/DRESSINGS)
ATTRACTOMAT 16X20 MAGNETIC DRP (DRAPES) ×2 IMPLANT
BLADE HEX COATED 2.75 (ELECTRODE) ×2 IMPLANT
BLADE SURG 15 STRL LF DISP TIS (BLADE) ×1 IMPLANT
BLADE SURG 15 STRL SS (BLADE) ×2
CHLORAPREP W/TINT 26ML (MISCELLANEOUS) ×4 IMPLANT
CLIP VESOCCLUDE MED 6/CT (CLIP) ×4 IMPLANT
CLIP VESOCCLUDE SM WIDE 6/CT (CLIP) ×4 IMPLANT
DERMABOND ADVANCED (GAUZE/BANDAGES/DRESSINGS)
DERMABOND ADVANCED .7 DNX12 (GAUZE/BANDAGES/DRESSINGS) IMPLANT
DRAPE LAPAROTOMY T 98X78 PEDS (DRAPES) ×2 IMPLANT
ELECT PENCIL ROCKER SW 15FT (MISCELLANEOUS) ×2 IMPLANT
ELECT REM PT RETURN 15FT ADLT (MISCELLANEOUS) ×2 IMPLANT
GAUZE 4X4 16PLY RFD (DISPOSABLE) ×2 IMPLANT
GAUZE SPONGE 4X4 12PLY STRL (GAUZE/BANDAGES/DRESSINGS) ×1 IMPLANT
GLOVE SURG ORTHO 8.0 STRL STRW (GLOVE) ×2 IMPLANT
GOWN STRL REUS W/TWL XL LVL3 (GOWN DISPOSABLE) ×6 IMPLANT
HEMOSTAT SURGICEL 2X4 FIBR (HEMOSTASIS) ×1 IMPLANT
ILLUMINATOR WAVEGUIDE N/F (MISCELLANEOUS) IMPLANT
KIT BASIN OR (CUSTOM PROCEDURE TRAY) ×2 IMPLANT
LIGHT WAVEGUIDE WIDE FLAT (MISCELLANEOUS) IMPLANT
NDL HYPO 25X1 1.5 SAFETY (NEEDLE) ×1 IMPLANT
NEEDLE HYPO 25X1 1.5 SAFETY (NEEDLE) ×2 IMPLANT
PACK BASIC VI WITH GOWN DISP (CUSTOM PROCEDURE TRAY) ×2 IMPLANT
POWDER SURGICEL 3.0 GRAM (HEMOSTASIS) IMPLANT
STAPLER VISISTAT 35W (STAPLE) ×2 IMPLANT
STRIP CLOSURE SKIN 1/2X4 (GAUZE/BANDAGES/DRESSINGS) ×1 IMPLANT
SUT MNCRL AB 4-0 PS2 18 (SUTURE) ×2 IMPLANT
SUT SILK 2 0 (SUTURE)
SUT SILK 2-0 18XBRD TIE 12 (SUTURE) IMPLANT
SUT SILK 3 0 (SUTURE)
SUT SILK 3-0 18XBRD TIE 12 (SUTURE) IMPLANT
SUT VIC AB 3-0 SH 18 (SUTURE) ×2 IMPLANT
SYR BULB IRRIGATION 50ML (SYRINGE) ×2 IMPLANT
SYR CONTROL 10ML LL (SYRINGE) ×2 IMPLANT
TAPE CLOTH SURG 4X10 WHT LF (GAUZE/BANDAGES/DRESSINGS) ×1 IMPLANT
TOWEL OR 17X26 10 PK STRL BLUE (TOWEL DISPOSABLE) ×2 IMPLANT
TOWEL OR NON WOVEN STRL DISP B (DISPOSABLE) ×2 IMPLANT
YANKAUER SUCT BULB TIP 10FT TU (MISCELLANEOUS) ×2 IMPLANT

## 2017-06-01 NOTE — Anesthesia Preprocedure Evaluation (Addendum)
Anesthesia Evaluation  Patient identified by MRN, date of birth, ID band Patient awake    Reviewed: Allergy & Precautions, NPO status , Patient's Chart, lab work & pertinent test results  Airway Mallampati: III  TM Distance: >3 FB Neck ROM: Full    Dental no notable dental hx.    Pulmonary former smoker,    Pulmonary exam normal breath sounds clear to auscultation       Cardiovascular hypertension, Pt. on medications Normal cardiovascular exam Rhythm:Regular Rate:Normal  ECG: NSR, rate 81. Incomplete RBBB, LAD   Neuro/Psych negative neurological ROS  negative psych ROS   GI/Hepatic Neg liver ROS,   Endo/Other  negative endocrine ROS  Renal/GU Renal disease (Cr: 3.24)     Musculoskeletal negative musculoskeletal ROS (+)   Abdominal   Peds  Hematology HLD   Anesthesia Other Findings   Reproductive/Obstetrics                            Anesthesia Physical Anesthesia Plan  ASA: III  Anesthesia Plan: General   Post-op Pain Management:    Induction: Intravenous  PONV Risk Score and Plan: 3 and Midazolam, Dexamethasone, Ondansetron and Treatment may vary due to age or medical condition  Airway Management Planned: Oral ETT  Additional Equipment:   Intra-op Plan:   Post-operative Plan: Extubation in OR  Informed Consent: I have reviewed the patients History and Physical, chart, labs and discussed the procedure including the risks, benefits and alternatives for the proposed anesthesia with the patient or authorized representative who has indicated his/her understanding and acceptance.   Dental advisory given  Plan Discussed with: CRNA  Anesthesia Plan Comments:         Anesthesia Quick Evaluation

## 2017-06-01 NOTE — Transfer of Care (Signed)
Immediate Anesthesia Transfer of Care Note  Patient: Karen Myers  Procedure(s) Performed: PARATHYROIDECTOMY (N/A Neck)  Patient Location: PACU  Anesthesia Type:General  Level of Consciousness: alert , patient cooperative and responds to stimulation  Airway & Oxygen Therapy: Patient connected to face mask oxygen  Post-op Assessment: Report given to RN, Post -op Vital signs reviewed and stable and Patient moving all extremities X 4  Post vital signs: stable  Last Vitals:  Vitals Value Taken Time  BP    Temp    Pulse 69 06/01/2017  9:47 AM  Resp 14 06/01/2017  9:47 AM  SpO2 100 % 06/01/2017  9:47 AM  Vitals shown include unvalidated device data.  Last Pain:  Vitals:   06/01/17 0704  TempSrc:   PainSc: 0-No pain      Patients Stated Pain Goal: 4 (82/95/62 1308)  Complications: No apparent anesthesia complications

## 2017-06-01 NOTE — Op Note (Signed)
OPERATIVE REPORT - PARATHYROIDECTOMY  Preoperative diagnosis: Primary hyperparathyroidism  Postop diagnosis: Same  Procedure: Left superior minimally invasive parathyroidectomy  Surgeon:  Armandina Gemma, MD  Assistant:  Nedra Hai, MD  Anesthesia: General endotracheal  Estimated blood loss: Minimal  Preparation: ChloraPrep  Indications: Patient returns for follow-up and discussion of her test results for primary hyperparathyroidism. At my request the patient underwent a 24-hour urine collection for calcium with the result of 46. 25-hydroxy vitamin D level was low at 15. Patient underwent a nuclear medicine parathyroid scan on February 13, 2017. This localizes persistent uptake to the left inferior position consistent with parathyroid adenoma. Patient returns today to discuss these results and to discuss my recommendations for minimally invasive parathyroidectomy.  Procedure: The patient was prepared in the pre-operative holding area. The patient was brought to the operating room and placed in a supine position on the operating room table. Following administration of general anesthesia, the patient was positioned and then prepped and draped in the usual strict aseptic fashion. After ascertaining that an adequate level of anesthesia been achieved, a neck incision was made with a #15 blade. Dissection was carried through subcutaneous tissues and platysma. Hemostasis was obtained with the electrocautery. Skin flaps were developed circumferentially and a Weitlander retractor was placed for exposure.  Strap muscles were incised in the midline. Strap muscles were reflected lateralley exposing the thyroid lobe. With gentle blunt dissection the thyroid lobe was mobilized.  Dissection was carried through adipose tissue and an enlarged parathyroid gland was identified. It was gently mobilized. Vascular structures were divided between small and medium ligaclips. Care was taken to avoid the recurrent  laryngeal nerve and the esophagus. The parathyroid gland was completely excised. It was submitted to pathology where frozen section confirmed parathyroid tissue consistent with adenoma.  Neck was irrigated with warm saline and good hemostasis was noted. Fibrillar was placed in the operative field. Strap muscles were reapproximated in the midline with interrupted 3-0 Vicryl sutures. Platysma was closed with interrupted 3-0 Vicryl sutures. Skin was closed with a running 4-0 Monocryl subcuticular suture. Marcaine was infiltrated circumferentially. Wound was washed and dried and Steristrips were applied. Patient was awakened from anesthesia and brought to the recovery room. The patient tolerated the procedure well.   Armandina Gemma, MD [facility] Office: [phone]

## 2017-06-01 NOTE — Interval H&P Note (Signed)
History and Physical Interval Note:  06/01/2017 8:21 AM  Karen Myers  has presented today for surgery, with the diagnosis of Primary hyperparathyroidism  The various methods of treatment have been discussed with the patient and family. After consideration of risks, benefits and other options for treatment, the patient has consented to  Procedure(s): PARATHYROIDECTOMY (N/A) as a surgical intervention .  The patient's history has been reviewed, patient examined, no change in status, stable for surgery.  I have reviewed the patient's chart and labs.  Questions were answered to the patient's satisfaction.     Juwuan Sedita Jerilynn Mages

## 2017-06-01 NOTE — Progress Notes (Signed)
Speaks clearly and swallowing without difficulty on admission, able to cough easily

## 2017-06-01 NOTE — Anesthesia Postprocedure Evaluation (Signed)
Anesthesia Post Note  Patient: Karen Myers  Procedure(s) Performed: PARATHYROIDECTOMY (N/A Neck)     Patient location during evaluation: PACU Anesthesia Type: General Level of consciousness: awake and alert Pain management: pain level controlled Vital Signs Assessment: post-procedure vital signs reviewed and stable Respiratory status: spontaneous breathing, nonlabored ventilation, respiratory function stable and patient connected to nasal cannula oxygen Cardiovascular status: blood pressure returned to baseline and stable Postop Assessment: no apparent nausea or vomiting Anesthetic complications: no    Last Vitals:  Vitals:   06/01/17 1043 06/01/17 1118  BP: (!) 147/86 (!) 160/87  Pulse: 66 69  Resp: 18 16  Temp: (!) 36.4 C (!) 36.3 C  SpO2: 96% 98%    Last Pain:  Vitals:   06/01/17 1118  TempSrc: Axillary  PainSc: 0-No pain                 Alis Sawchuk P Carmeline Kowal

## 2017-06-01 NOTE — Progress Notes (Signed)
No dysphonia or dysphagia, coughs easily

## 2017-06-01 NOTE — Anesthesia Procedure Notes (Signed)
Procedure Name: Intubation Date/Time: 06/01/2017 8:53 AM Performed by: Pilar Grammes, CRNA Pre-anesthesia Checklist: Patient identified, Emergency Drugs available, Suction available, Patient being monitored and Timeout performed Patient Re-evaluated:Patient Re-evaluated prior to induction Oxygen Delivery Method: Circle system utilized Preoxygenation: Pre-oxygenation with 100% oxygen Induction Type: IV induction Ventilation: Mask ventilation without difficulty Laryngoscope Size: Miller and 3 Tube type: Oral Tube size: 7.0 mm Number of attempts: 1 Airway Equipment and Method: Stylet Placement Confirmation: positive ETCO2,  ETT inserted through vocal cords under direct vision,  CO2 detector and breath sounds checked- equal and bilateral Secured at: 20 cm Tube secured with: Tape Dental Injury: Teeth and Oropharynx as per pre-operative assessment

## 2017-06-01 NOTE — Discharge Instructions (Signed)
Parathyroidectomy A parathyroidectomy is surgery to remove one or more parathyroid glands. These glands are in your neck. They produce a hormone (parathyroid hormone) that helps to control the level of calcium in your body. Each gland is very small, about the size of a pea. Most people have four parathyroid glands. You may have a parathyroidectomy if your body produces too much parathyroid hormone (hyperparathyroidism). This is usually caused by one or more of your parathyroid glands becoming enlarged from a type of noncancerous tumor (adenoma). One of four methods may be used for a parathyroidectomy. An open, minimally invasive, video-assisted, or endoscopic procedure may be done. Some steps of the procedure will vary depending on the method being used. Tell a health care provider about:  Any allergies you have.  All medicines you are taking, including vitamins, herbs, eye drops, creams, and over-the-counter medicines.  Any problems you or family members have had with anesthetic medicines.  Any blood disorders you have.  Any surgeries you have had.  Any medical conditions you have. What are the risks? Generally, this is a safe procedure. However, problems can occur and include:  Excessive bleeding.  Infection near the incision.  Slow healing.  Pooling of blood under the skin in the incision area (hematoma).  Blood clots.  Damage to: ? The nerves in your neck. ? Skin (scarring). ? Surrounding blood vessels.  Need for additional surgery.  A hoarse or weak voice.  A condition in which your body does not make enough parathyroid hormone (hypoparathyroidism). This is rare.  Difficulty breathing. This is rare.  What happens before the procedure?  Ask your health care provider: ? About changing or stopping your regular medicines. This is especially important if you are taking diabetes medicines or blood thinners. ? If you can take medicines such as aspirin and ibuprofen. These  medicines can thin your blood. Do not take these medicines if your health care provider directs you not to.  Do not eat or drink anything after midnight on the night before the procedure or as directed by your health care provider.  You might be asked to shower or wash with an antibacterial soap.  Plan to have someone take you home after the procedure. What happens during the procedure?  An IV tube will be inserted into a vein in your hand or arm. Medicine will flow through this tube directly into your body.  You may be given a medicine to help you relax (sedative).  You will be given a medicine that makes you go to sleep (general anesthetic).  As soon as you are asleep, the surgeon will make the incisions needed for the type of procedure that you are having. ? Open parathyroidectomy. For this procedure, the surgeon will make a single incision in the center of your neck. The incision will be about 2-4 inches long. ? Minimally invasive parathyroidectomy. For this procedure, the surgeon will make one small incision in the side of your neck. This smaller incision will be about 1-2 inches long. Before the procedure, you might be given an injection of a type of medicine that will help the surgeon to locate the gland. ? Video-assisted parathyroidectomy. For this procedure, two small incisions will be made in your neck. One incision is for the instruments that will be used to remove the gland. The other incision is for a tiny camera that helps the surgeon to see inside your neck. ? Endoscopic parathyroidectomy. For this procedure, the surgeon uses a tool that is like  a small, flexible, tubelike telescope (endoscope). The endoscope is inserted through an incision that is made just above your collarbone. This method reduces the scarring and pain of a parathyroidectomy.  The surgeon will remove the gland or glands that are causing problems and then close the incisions using stitches or other methods. The  stitches are often hidden under the skin. What happens after the procedure?  Your blood pressure, heart rate, breathing rate, and blood oxygen level will be monitored often until the medicines you were given have worn off.  Your blood will be tested to check the calcium level in your body.  You may have numbness around your mouth or in your fingers or toes for a day or two after the surgery. This is caused by a low level of calcium. You may have to take calcium supplements. This information is not intended to replace advice given to you by your health care provider. Make sure you discuss any questions you have with your health care provider. Document Released: 04/22/2008 Document Revised: 07/02/2015 Document Reviewed: 06/19/2013 Elsevier Interactive Patient Education  2018 [place] Anesthesia, Adult, Care After These instructions provide you with information about caring for yourself after your procedure. Your health care provider may also give you more specific instructions. Your treatment has been planned according to current medical practices, but problems sometimes occur. Call your health care provider if you have any problems or questions after your procedure. What can I expect after the procedure? After the procedure, it is common to have:  Vomiting.  A sore throat.  Mental slowness.  It is common to feel:  Nauseous.  Cold or shivery.  Sleepy.  Tired.  Sore or achy, even in parts of your body where you did not have surgery.  Follow these instructions at home: For at least 24 hours after the procedure:  Do not: ? Participate in activities where you could fall or become injured. ? Drive. ? Use heavy machinery. ? Drink alcohol. ? Take sleeping pills or medicines that cause drowsiness. ? Make important decisions or sign legal documents. ? Take care of children on your own.  Rest. Eating and drinking  If you vomit, drink water, juice, or soup when  you can drink without vomiting.  Drink enough fluid to keep your urine clear or pale yellow.  Make sure you have little or no nausea before eating solid foods.  Follow the diet recommended by your health care provider. General instructions  Have a responsible adult stay with you until you are awake and alert.  Return to your normal activities as told by your health care provider. Ask your health care provider what activities are safe for you.  Take over-the-counter and prescription medicines only as told by your health care provider.  If you smoke, do not smoke without supervision.  Keep all follow-up visits as told by your health care provider. This is important. Contact a health care provider if:  You continue to have nausea or vomiting at home, and medicines are not helpful.  You cannot drink fluids or start eating again.  You cannot urinate after 8-12 hours.  You develop a skin rash.  You have fever.  You have increasing redness at the site of your procedure. Get help right away if:  You have difficulty breathing.  You have chest pain.  You have unexpected bleeding.  You feel that you are having a life-threatening or urgent problem. This information is not intended to replace advice  given to you by your health care provider. Make sure you discuss any questions you have with your health care provider. Document Released: 05/02/2000 Document Revised: 06/29/2015 Document Reviewed: 01/08/2015 Elsevier Interactive Patient Education  [employer].

## 2017-10-23 ENCOUNTER — Ambulatory Visit (HOSPITAL_COMMUNITY)
Admission: EM | Admit: 2017-10-23 | Discharge: 2017-10-23 | Disposition: A | Discharge status: Home / Self Care | Payer: Medicare Other | Attending: Family Medicine | Admitting: Family Medicine

## 2017-10-23 ENCOUNTER — Encounter (HOSPITAL_COMMUNITY): Payer: Self-pay

## 2017-10-23 DIAGNOSIS — T63441A Toxic effect of venom of bees, accidental (unintentional), initial encounter: Secondary | ICD-10-CM | POA: Diagnosis not present

## 2017-10-23 MED ORDER — PREDNISONE 20 MG PO TABS: 40.0000 mg | ORAL_TABLET | Freq: Every day | ORAL | 0 refills | Status: AC

## 2017-10-23 NOTE — Discharge Instructions (Signed)
May continue with benadryl or zyrtec as antihistamine.  Three days of prednisone as well.  Ice pack application.  If symptoms worsen ,develop pain, or do not improve in the next week to return to be seen or to follow up with your PCP.

## 2017-10-23 NOTE — ED Triage Notes (Signed)
Pt presents with allergic reaction to bee sting on right knee .

## 2017-10-23 NOTE — ED Provider Notes (Signed)
[place]    CSN: [id number] Arrival date & time: 10/23/17  1523     History   Chief Complaint Chief Complaint  Patient presents with  . Allergic Reaction    HPI Karen Myers is a 72 y.o. female.   Karen Myers presents with complaints of redness and itching to right leg near her knee after she was stung by a knee yesterday. She did see the bee after she felt it. Took benadryl and applied ice yesterday. Today with larger area of redness and itching. No pain. No throat itching, swelling, difficulty breathing, cough, or wheezing. Denies any previous bee reactions. No knee pain, no previous knee surgeries. No hx of dm. Hx of arthritis, breast cancer, CKD, gerd, htn.     ROS per HPI.      Past Medical History:  Diagnosis Date  . Arthritis   . Breast cancer ([place])    left breast cancer   . Breast cancer ([place])   . Chronic kidney disease   . CIN III (cervical intraepithelial neoplasia III)    prior to LEEP procedure  . GERD (gastroesophageal reflux disease)   . High cholesterol   . Hypertension     Patient Active Problem List   Diagnosis Date Noted  . Hyperparathyroidism, primary ([place]) 05/27/2017  . CIN 3 - cervical intraepithelial neoplasia grade 3 10/06/2011  . Breast CA ([place]) 12/02/2010  . Renal insufficiency 12/02/2010  . Hypertension 12/02/2010  . Hyperlipidemia 12/02/2010    Past Surgical History:  Procedure Laterality Date  . BREAST LUMPECTOMY    . CERVICAL BIOPSY  W/ LOOP ELECTRODE EXCISION  2005  . COLONOSCOPY    . HYSTEROSCOPY W/D&C  2005  . MASTECTOMY     bilateral   . PARATHYROIDECTOMY N/A 06/01/2017   Procedure: PARATHYROIDECTOMY;  Surgeon: Armandina Gemma, MD;  Location: [facility];  Service: General;  Laterality: N/A;  . POLYPECTOMY    . RENAL BIOPSY    . VULVA /PERINEUM BIOPSY     nevus on vulva     OB History    Gravida  2   Para  2   Term      Preterm      AB      Living  2     SAB      TAB      Ectopic      Multiple      Live Births               Home Medications    Prior to Admission medications   Medication Sig Start Date End Date Taking? Authorizing Provider  acetaminophen (TYLENOL) 500 MG tablet Take 1,000 mg by mouth 2 (two) times daily.    [provider]  aspirin EC 81 MG tablet Take 81 mg by mouth daily.    [provider]  atorvastatin (LIPITOR) 40 MG tablet Take 40 mg by mouth daily.     [provider]  cyclobenzaprine (FLEXERIL) 5 MG tablet Take 1 tablet (5 mg total) by mouth at bedtime. Patient not taking: Reported on 05/25/2017 02/09/17   Ok Edwards, PA-C  diltiazem (CARDIZEM CD) 240 MG 24 hr capsule Take 240 mg by mouth daily. 04/25/17   [provider]  losartan (COZAAR) 100 MG tablet Take 100 mg by mouth daily.    [provider]  predniSONE (DELTASONE) 20 MG tablet Take 2 tablets (40 mg total) by mouth daily with breakfast for 3 days. 10/23/17 10/26/17  Zigmund Gottron, NP  traMADol (ULTRAM) 50 MG tablet Take 1-2 tablets (50-100 mg total) by mouth every 6 (six) hours as needed for moderate pain. 06/01/17   Armandina Gemma, MD    Family History History reviewed. No pertinent family history.  Social History Social History   Tobacco Use  . Smoking status: Former Research scientist (life sciences)  . Smokeless tobacco: Never Used  Substance Use Topics  . Alcohol use: No  . Drug use: No     Allergies   Altace [ramipril]   Review of Systems Review of Systems   Physical Exam Triage Vital Signs ED Triage Vitals [10/23/17 1543]  Enc Vitals Group     BP 117/82     Pulse Rate 81     Resp 20     Temp 97.9 F (36.6 C)     Temp Source Oral     SpO2 97 %     Weight      Height      Head Circumference      Peak Flow      Pain Score      Pain Loc      Pain Edu?      Excl. in [place]?    No data found.  Updated Vital Signs BP 117/82 (BP Location: Right Arm)   Pulse 81   Temp 97.9 F (36.6 C) (Oral)   Resp 20   SpO2 97%    Physical Exam    Constitutional: She is oriented to person, place, and time. She appears well-developed and well-nourished. No distress.  Cardiovascular: Normal rate, regular rhythm and normal heart sounds.  Pulmonary/Chest: Effort normal and breath sounds normal. She has no wheezes.  Musculoskeletal:       Legs: Puncture wound noted to right distal thigh with mild surrounding redness which then extends down right lateral knee and to proximal lower leg; no tenderness; full ROM of knee without pain or difficulty; ambulatory without difficulty   Neurological: She is alert and oriented to person, place, and time.  Skin: Skin is warm and dry.     UC Treatments / Results  Labs (all labs ordered are listed, but only abnormal results are displayed) Labs Reviewed - No data to display  EKG None  Radiology No results found.  Procedures Procedures (including critical care time)  Medications Ordered in UC Medications - No data to display  Initial Impression / Assessment and Plan / UC Course  I have reviewed the triage vital signs and the nursing notes.  Pertinent labs & imaging results that were available during my care of the patient were reviewed by me and considered in my medical decision making (see chart for details).     History and physical consistent with localized reaction related to bee sting. Three days of prednisone provided. Continue with evening benadryl, ice application. Return precautions provided. If symptoms worsen or do not improve in the next week to return to be seen or to follow up with PCP.  Patient verbalized understanding and agreeable to plan.   Final Clinical Impressions(s) / UC Diagnoses   Final diagnoses:  Bee sting reaction, accidental or unintentional, initial encounter     Discharge Instructions     May continue with benadryl or zyrtec as antihistamine.  Three days of prednisone as well.  Ice pack application.  If symptoms worsen ,develop pain, or do not improve  in the next week to return to be seen or to follow up with your PCP.  ED Prescriptions    Medication Sig Dispense Auth. Provider   predniSONE (DELTASONE) 20 MG tablet Take 2 tablets (40 mg total) by mouth daily with breakfast for 3 days. 6 tablet Zigmund Gottron, NP     Controlled Substance Prescriptions [place] Controlled Substance Registry consulted? Not Applicable   Zigmund Gottron, NP 10/23/17 [phone]

## 2018-01-03 ENCOUNTER — Other Ambulatory Visit: Payer: Self-pay | Admitting: Nephrology

## 2018-01-08 ENCOUNTER — Other Ambulatory Visit: Payer: Self-pay | Admitting: Nephrology

## 2018-01-08 ENCOUNTER — Ambulatory Visit
Admission: RE | Admit: 2018-01-08 | Discharge: 2018-01-08 | Disposition: A | Discharge status: Home / Self Care | Payer: Medicare Other | Source: Ambulatory Visit | Attending: Nephrology | Admitting: Nephrology

## 2018-01-29 ENCOUNTER — Other Ambulatory Visit: Payer: Self-pay | Admitting: Surgery

## 2018-01-29 DIAGNOSIS — E349 Endocrine disorder, unspecified: Secondary | ICD-10-CM

## 2018-02-05 ENCOUNTER — Ambulatory Visit
Admission: RE | Admit: 2018-02-05 | Discharge: 2018-02-05 | Disposition: A | Discharge status: Home / Self Care | Payer: Medicare Other | Source: Ambulatory Visit | Attending: Surgery | Admitting: Surgery

## 2018-02-05 DIAGNOSIS — E349 Endocrine disorder, unspecified: Secondary | ICD-10-CM

## 2018-02-21 ENCOUNTER — Other Ambulatory Visit: Payer: Self-pay | Admitting: Surgery

## 2018-02-21 DIAGNOSIS — E041 Nontoxic single thyroid nodule: Secondary | ICD-10-CM

## 2018-03-06 ENCOUNTER — Other Ambulatory Visit: Payer: Self-pay | Admitting: Surgery

## 2018-03-06 ENCOUNTER — Ambulatory Visit
Admission: RE | Admit: 2018-03-06 | Discharge: 2018-03-06 | Disposition: A | Discharge status: Home / Self Care | Payer: Medicare Other | Source: Ambulatory Visit | Attending: Surgery | Admitting: Surgery

## 2018-03-06 DIAGNOSIS — E041 Nontoxic single thyroid nodule: Secondary | ICD-10-CM

## 2018-04-18 ENCOUNTER — Encounter (HOSPITAL_COMMUNITY): Payer: Self-pay

## 2018-04-18 ENCOUNTER — Ambulatory Visit (HOSPITAL_COMMUNITY)
Admission: EM | Admit: 2018-04-18 | Discharge: 2018-04-18 | Disposition: A | Discharge status: Home / Self Care | Payer: Medicare Other | Attending: Family Medicine | Admitting: Family Medicine

## 2018-04-18 DIAGNOSIS — A084 Viral intestinal infection, unspecified: Secondary | ICD-10-CM

## 2018-04-18 MED ORDER — ONDANSETRON 8 MG PO TBDP: 8.0000 mg | ORAL_TABLET | Freq: Three times a day (TID) | ORAL | 0 refills | Status: DC | PRN

## 2018-04-18 NOTE — ED Provider Notes (Signed)
[place]    CSN: [id number] Arrival date & time: 04/18/18  1456     History   Chief Complaint Chief Complaint  Patient presents with  . Nausea  . Diarrhea  . Fatigue    HPI Karen Myers is a 73 y.o. female.   Pt presents with nausea, diarrhea, and fatigue since yesterday.  She vomited yesterday after the symptoms started late Monday night.  She has had no fever, myalgias, or headache.  Patient is a retired Pharmacist, hospital     Past Medical History:  Diagnosis Date  . Arthritis   . Breast cancer ([place])    left breast cancer   . Breast cancer ([place])   . Chronic kidney disease   . CIN III (cervical intraepithelial neoplasia III)    prior to LEEP procedure  . GERD (gastroesophageal reflux disease)   . High cholesterol   . Hypertension     Patient Active Problem List   Diagnosis Date Noted  . Hyperparathyroidism, primary ([place]) 05/27/2017  . CIN 3 - cervical intraepithelial neoplasia grade 3 10/06/2011  . Breast CA ([place]) 12/02/2010  . Renal insufficiency 12/02/2010  . Hypertension 12/02/2010  . Hyperlipidemia 12/02/2010    Past Surgical History:  Procedure Laterality Date  . BREAST LUMPECTOMY    . CERVICAL BIOPSY  W/ LOOP ELECTRODE EXCISION  2005  . COLONOSCOPY    . HYSTEROSCOPY W/D&C  2005  . MASTECTOMY     bilateral   . PARATHYROIDECTOMY N/A 06/01/2017   Procedure: PARATHYROIDECTOMY;  Surgeon: Armandina Gemma, MD;  Location: [facility];  Service: General;  Laterality: N/A;  . POLYPECTOMY    . RENAL BIOPSY    . VULVA /PERINEUM BIOPSY     nevus on vulva     OB History    Gravida  2   Para  2   Term      Preterm      AB      Living  2     SAB      TAB      Ectopic      Multiple      Live Births               Home Medications    Prior to Admission medications   Medication Sig Start Date End Date Taking? Authorizing Provider  acetaminophen (TYLENOL) 500 MG tablet Take 1,000 mg by mouth 2 (two) times daily.    [provider]  aspirin EC 81 MG tablet Take 81 mg by mouth daily.    [provider]  atorvastatin (LIPITOR) 40 MG tablet Take 40 mg by mouth daily.     [provider]  diltiazem (CARDIZEM CD) 240 MG 24 hr capsule Take 240 mg by mouth daily. 04/25/17   [provider]  losartan (COZAAR) 100 MG tablet Take 100 mg by mouth daily.    [provider]  ondansetron (ZOFRAN-ODT) 8 MG disintegrating tablet Take 1 tablet (8 mg total) by mouth every 8 (eight) hours as needed for nausea. 04/18/18   Robyn Haber, MD  traMADol (ULTRAM) 50 MG tablet Take 1-2 tablets (50-100 mg total) by mouth every 6 (six) hours as needed for moderate pain. 06/01/17   Armandina Gemma, MD    Family History History reviewed. No pertinent family history.  Social History Social History   Tobacco Use  . Smoking status: Former Research scientist (life sciences)  . Smokeless tobacco: Never Used  Substance Use Topics  . Alcohol use: No  .  Drug use: No     Allergies   Altace [ramipril]   Review of Systems Review of Systems   Physical Exam Triage Vital Signs ED Triage Vitals  Enc Vitals Group     BP 04/18/18 1528 (!) 147/87     Pulse Rate 04/18/18 1528 (!) 114     Resp 04/18/18 1528 20     Temp 04/18/18 1528 98.4 F (36.9 C)     Temp Source 04/18/18 1528 Oral     SpO2 04/18/18 1528 100 %     Weight --      Height --      Head Circumference --      Peak Flow --      Pain Score 04/18/18 1529 0     Pain Loc --      Pain Edu? --      Excl. in [place]? --    No data found.  Updated Vital Signs BP (!) 147/87 (BP Location: Left Arm)   Pulse (!) 114   Temp 98.4 F (36.9 C) (Oral)   Resp 20   SpO2 100%    Physical Exam Vitals signs and nursing note reviewed.  Constitutional:      Appearance: Normal appearance. She is normal weight.  HENT:     Mouth/Throat:     Mouth: Mucous membranes are moist.     Pharynx: Oropharynx is clear.  Eyes:     Conjunctiva/sclera: Conjunctivae normal.  Neck:      Musculoskeletal: Normal range of motion and neck supple.  Cardiovascular:     Rate and Rhythm: Normal rate and regular rhythm.     Heart sounds: Normal heart sounds.  Pulmonary:     Effort: Pulmonary effort is normal.     Breath sounds: Normal breath sounds.  Abdominal:     General: Abdomen is flat.     Tenderness: There is no abdominal tenderness.  Musculoskeletal: Normal range of motion.  Skin:    General: Skin is warm and dry.  Neurological:     General: No focal deficit present.     Mental Status: She is alert.  Psychiatric:        Mood and Affect: Mood normal.        Behavior: Behavior normal.      UC Treatments / Results  Labs (all labs ordered are listed, but only abnormal results are displayed) Labs Reviewed - No data to display  EKG None  Radiology No results found.  Procedures Procedures (including critical care time)  Medications Ordered in UC Medications - No data to display  Initial Impression / Assessment and Plan / UC Course  I have reviewed the triage vital signs and the nursing notes.  Pertinent labs & imaging results that were available during my care of the patient were reviewed by me and considered in my medical decision making (see chart for details).    Final Clinical Impressions(s) / UC Diagnoses   Final diagnoses:  Viral gastroenteritis     Discharge Instructions     No dairy or greasy food for the next 2 days  One pepcid a day for a couple days    ED Prescriptions    Medication Sig Dispense Auth. Provider   ondansetron (ZOFRAN-ODT) 8 MG disintegrating tablet Take 1 tablet (8 mg total) by mouth every 8 (eight) hours as needed for nausea. 12 tablet Robyn Haber, MD     Controlled Substance Prescriptions  Controlled Substance Registry consulted? Not Applicable   Robyn Haber,  MD 04/18/18 1542

## 2018-04-18 NOTE — Discharge Instructions (Addendum)
No dairy or greasy food for the next 2 days  One pepcid a day for a couple days

## 2018-04-18 NOTE — ED Triage Notes (Signed)
Pt presents with nausea, diarrhea, and fatigue since yesterday.

## 2018-07-04 ENCOUNTER — Encounter (HOSPITAL_COMMUNITY): Payer: Self-pay | Admitting: Emergency Medicine

## 2018-07-04 ENCOUNTER — Emergency Department (HOSPITAL_COMMUNITY)
Admission: EM | Admit: 2018-07-04 | Discharge: 2018-07-04 | Disposition: A | Discharge status: Home / Self Care | Payer: Medicare Other | Attending: Emergency Medicine | Admitting: Emergency Medicine

## 2018-07-04 ENCOUNTER — Emergency Department (HOSPITAL_COMMUNITY): Payer: Medicare Other

## 2018-07-04 ENCOUNTER — Other Ambulatory Visit: Payer: Self-pay

## 2018-07-04 DIAGNOSIS — Z79899 Other long term (current) drug therapy: Secondary | ICD-10-CM | POA: Diagnosis not present

## 2018-07-04 DIAGNOSIS — R51 Headache: Secondary | ICD-10-CM | POA: Insufficient documentation

## 2018-07-04 DIAGNOSIS — N189 Chronic kidney disease, unspecified: Secondary | ICD-10-CM | POA: Diagnosis not present

## 2018-07-04 DIAGNOSIS — I129 Hypertensive chronic kidney disease with stage 1 through stage 4 chronic kidney disease, or unspecified chronic kidney disease: Secondary | ICD-10-CM | POA: Insufficient documentation

## 2018-07-04 DIAGNOSIS — Z87891 Personal history of nicotine dependence: Secondary | ICD-10-CM | POA: Diagnosis not present

## 2018-07-04 DIAGNOSIS — M791 Myalgia, unspecified site: Secondary | ICD-10-CM | POA: Diagnosis not present

## 2018-07-04 DIAGNOSIS — R05 Cough: Secondary | ICD-10-CM | POA: Diagnosis not present

## 2018-07-04 DIAGNOSIS — Z853 Personal history of malignant neoplasm of breast: Secondary | ICD-10-CM | POA: Diagnosis not present

## 2018-07-04 DIAGNOSIS — U071 COVID-19: Secondary | ICD-10-CM | POA: Diagnosis not present

## 2018-07-04 DIAGNOSIS — R509 Fever, unspecified: Secondary | ICD-10-CM | POA: Diagnosis present

## 2018-07-04 LAB — BASIC METABOLIC PANEL
Anion gap: 12 (ref 5–15)
BUN: 33 mg/dL — ABNORMAL HIGH (ref 8–23)
CO2: 17 mmol/L — ABNORMAL LOW (ref 22–32)
Calcium: 10.2 mg/dL (ref 8.9–10.3)
Chloride: 107 mmol/L (ref 98–111)
Creatinine, Ser: 3.25 mg/dL — ABNORMAL HIGH (ref 0.44–1.00)
GFR calc Af Amer: 16 mL/min — ABNORMAL LOW (ref >60)
GFR calc non Af Amer: 14 mL/min — ABNORMAL LOW (ref >60)
Glucose, Bld: 116 mg/dL — ABNORMAL HIGH (ref 70–99)
Potassium: 4.5 mmol/L (ref 3.5–5.1)
Sodium: 136 mmol/L (ref 135–145)

## 2018-07-04 LAB — CBC WITH DIFFERENTIAL/PLATELET
Abs Immature Granulocytes: 0.03 10*3/uL (ref 0.00–0.07)
Basophils Absolute: 0 10*3/uL (ref 0.0–0.1)
Basophils Relative: 1 %
Eosinophils Absolute: 0 10*3/uL (ref 0.0–0.5)
Eosinophils Relative: 0 %
HCT: 35.6 % — ABNORMAL LOW (ref 36.0–46.0)
Hemoglobin: 11.2 g/dL — ABNORMAL LOW (ref 12.0–15.0)
Immature Granulocytes: 1 %
Lymphocytes Relative: 10 %
Lymphs Abs: 0.6 10*3/uL — ABNORMAL LOW (ref 0.7–4.0)
MCH: 28.9 pg (ref 26.0–34.0)
MCHC: 31.5 g/dL (ref 30.0–36.0)
MCV: 92 fL (ref 80.0–100.0)
Monocytes Absolute: 1.5 10*3/uL — ABNORMAL HIGH (ref 0.1–1.0)
Monocytes Relative: 25 %
Neutro Abs: 3.8 10*3/uL (ref 1.7–7.7)
Neutrophils Relative %: 63 %
Platelets: 233 10*3/uL (ref 150–400)
RBC: 3.87 MIL/uL (ref 3.87–5.11)
RDW: 14.1 % (ref 11.5–15.5)
WBC: 5.9 10*3/uL (ref 4.0–10.5)
nRBC: 0 % (ref 0.0–0.2)

## 2018-07-04 LAB — SARS CORONAVIRUS 2 BY RT PCR (HOSPITAL ORDER, PERFORMED IN ~~LOC~~ HOSPITAL LAB): SARS Coronavirus 2: POSITIVE — AB

## 2018-07-04 MED ORDER — ACETAMINOPHEN 325 MG PO TABS
650.0000 mg | ORAL_TABLET | Freq: Once | ORAL | Status: AC
  Administered 2018-07-04: 650 mg via ORAL
  Filled 2018-07-04: qty 2

## 2018-07-04 NOTE — ED Provider Notes (Signed)
[facility] EMERGENCY DEPARTMENT Provider Note   CSN: [id number] Arrival date & time: 07/04/18  0541    History   Chief Complaint Chief Complaint  Patient presents with  . Fever  . Generalized Body Aches    HPI Karen Myers is a 73 y.o. female.     The history is provided by the patient.  Fever  Max temp prior to arrival:  100.3 Severity:  Moderate Onset quality:  Gradual Duration:  1 day Timing:  Constant Progression:  Worsening Chronicity:  New Relieved by:  None tried Worsened by:  Nothing Associated symptoms: chills, cough, headaches and myalgias   Associated symptoms: no diarrhea, no rash, no sore throat and no vomiting    Patient reports over the past 24 hours she has had fatigue, chills, fever, headache, cough. She reports she was at her daughter's house on the holiday, and since then has been feeling ill. No vomiting or diarrhea.  No chest pain or shortness of breath. No travel No tick bites/rash Past Medical History:  Diagnosis Date  . Arthritis   . Breast cancer ([place])    left breast cancer   . Breast cancer ([place])   . Chronic kidney disease   . CIN III (cervical intraepithelial neoplasia III)    prior to LEEP procedure  . GERD (gastroesophageal reflux disease)   . High cholesterol   . Hypertension     Patient Active Problem List   Diagnosis Date Noted  . Hyperparathyroidism, primary ([place]) 05/27/2017  . CIN 3 - cervical intraepithelial neoplasia grade 3 10/06/2011  . Breast CA ([place]) 12/02/2010  . Renal insufficiency 12/02/2010  . Hypertension 12/02/2010  . Hyperlipidemia 12/02/2010    Past Surgical History:  Procedure Laterality Date  . BREAST LUMPECTOMY    . CERVICAL BIOPSY  W/ LOOP ELECTRODE EXCISION  2005  . COLONOSCOPY    . HYSTEROSCOPY W/D&C  2005  . MASTECTOMY     bilateral   . PARATHYROIDECTOMY N/A 06/01/2017   Procedure: PARATHYROIDECTOMY;  Surgeon: Armandina Gemma, MD;  Location: [facility];  Service: General;   Laterality: N/A;  . POLYPECTOMY    . RENAL BIOPSY    . VULVA /PERINEUM BIOPSY     nevus on vulva      OB History    Gravida  2   Para  2   Term      Preterm      AB      Living  2     SAB      TAB      Ectopic      Multiple      Live Births               Home Medications    Prior to Admission medications   Medication Sig Start Date End Date Taking? Authorizing Provider  acetaminophen (TYLENOL) 500 MG tablet Take 1,000 mg by mouth 2 (two) times daily.    [provider]  aspirin EC 81 MG tablet Take 81 mg by mouth daily.    [provider]  atorvastatin (LIPITOR) 40 MG tablet Take 40 mg by mouth daily.     [provider]  diltiazem (CARDIZEM CD) 240 MG 24 hr capsule Take 240 mg by mouth daily. 04/25/17   [provider]  losartan (COZAAR) 100 MG tablet Take 100 mg by mouth daily.    [provider]  ondansetron (ZOFRAN-ODT) 8 MG disintegrating tablet Take 1 tablet (8 mg total)  by mouth every 8 (eight) hours as needed for nausea. 04/18/18   Robyn Haber, MD  traMADol (ULTRAM) 50 MG tablet Take 1-2 tablets (50-100 mg total) by mouth every 6 (six) hours as needed for moderate pain. 06/01/17   Armandina Gemma, MD    Family History No family history on file.  Social History Social History   Tobacco Use  . Smoking status: Former Research scientist (life sciences)  . Smokeless tobacco: Never Used  Substance Use Topics  . Alcohol use: No  . Drug use: No     Allergies   Altace [ramipril]   Review of Systems Review of Systems  Constitutional: Positive for chills, fatigue and fever.  HENT: Negative for sore throat.   Respiratory: Positive for cough.   Gastrointestinal: Negative for diarrhea and vomiting.  Musculoskeletal: Positive for myalgias.  Skin: Negative for rash.  Neurological: Positive for headaches.  All other systems reviewed and are negative.    Physical Exam Updated Vital Signs BP (!) 164/109   Pulse (!) 106   Temp  99.9 F (37.7 C) (Oral)   Resp 20   Ht 1.727 m (5\' 8" )   Wt 79.4 kg   SpO2 98%   BMI 26.61 kg/m   Physical Exam CONSTITUTIONAL: Well developed/well nourished HEAD: Normocephalic/atraumatic EYES: EOMI/PERRL ENMT: Mucous membranes moist NECK: supple no meningeal signs SPINE/BACK:entire spine nontender CV: tachycardic LUNGS: no distress noted ABDOMEN: soft, nontender, no rebound or guarding, bowel sounds noted throughout abdomen GU:no cva tenderness NEURO: Pt is awake/alert/appropriate, moves all extremitiesx4.  No facial droop.   EXTREMITIES: pulses normal/equal, full ROM SKIN: warm, color normal PSYCH: no abnormalities of mood noted, alert and oriented to situation   ED Treatments / Results  Labs (all labs ordered are listed, but only abnormal results are displayed) Labs Reviewed  SARS CORONAVIRUS 2 (HOSPITAL ORDER, [facility])  CBC WITH DIFFERENTIAL/PLATELET  BASIC METABOLIC PANEL    EKG EKG Interpretation  Date/Time:  Wednesday Jul 04 2018 06:25:08 EDT Ventricular Rate:  101 PR Interval:    QRS Duration: 108 QT Interval:  340 QTC Calculation: 441 R Axis:   -46 Text Interpretation:  Sinus tachycardia LAE, consider biatrial enlargement LAD, consider left anterior fascicular block LVH with secondary repolarization abnormality Anterior infarct, old Confirmed by Ripley Fraise [id number]) on 07/04/2018 7:01:47 AM   Radiology Dg Chest Port 1 View  Result Date: 07/04/2018 CLINICAL DATA:  Shortness of breath EXAM: PORTABLE CHEST 1 VIEW COMPARISON:  05/31/2017 FINDINGS: Normal heart size and mediastinal contours. There is no edema, consolidation, effusion, or pneumothorax. Artifact from EKG leads. IMPRESSION: No active disease. Electronically Signed   By: Monte Fantasia M.D.   On: 07/04/2018 06:53    Procedures Procedures   Medications Ordered in ED Medications  acetaminophen (TYLENOL) tablet 650 mg (has no administration in time range)      Initial Impression / Assessment and Plan / ED Course  I have reviewed the triage vital signs and the nursing notes.  Pertinent labs & imaging results that were available during my care of the patient were reviewed by me and considered in my medical decision making (see chart for details).        7:02 AM Pt with fever/cough/HA/myalgias She is well appearing No hypoxia Plan at signout to dr Stark Jock, f/u on labs.     Karen Myers was evaluated in Emergency Department on 07/04/2018 for the symptoms described in the history of present illness. She was evaluated in the context of the  global COVID-19 pandemic, which necessitated consideration that the patient might be at risk for infection with the SARS-CoV-2 virus that causes COVID-19. Institutional protocols and algorithms that pertain to the evaluation of patients at risk for COVID-19 are in a state of rapid change based on information released by regulatory bodies including the CDC and federal and state organizations. These policies and algorithms were followed during the patient's care in the ED.   Final Clinical Impressions(s) / ED Diagnoses   Final diagnoses:  None    ED Discharge Orders    None       Ripley Fraise, MD 07/04/18 [phone]

## 2018-07-04 NOTE — ED Notes (Addendum)
Pt discharged from ED; instructions provided; Pt encouraged to return to ED if symptoms worsen and to f/u with PCP; Pt verbalized understanding of all instructions 

## 2018-07-04 NOTE — Discharge Instructions (Addendum)
Person Under Monitoring Name: Karen Myers  Location: [address] [place] [address]   Infection Prevention Recommendations for Individuals Confirmed to have, or Being Evaluated for, 2019 Novel Coronavirus (COVID-19) Infection Who Receive Care at Home  Individuals who are confirmed to have, or are being evaluated for, COVID-19 should follow the prevention steps below until a healthcare provider or local or state health department says they can return to normal activities.  Stay home except to get medical care You should restrict activities outside your home, except for getting medical care. Do not go to work, school, or public areas, and do not use public transportation or taxis.  Call ahead before visiting your doctor Before your medical appointment, call the healthcare provider and tell them that you have, or are being evaluated for, COVID-19 infection. This will help the healthcare providers office take steps to keep other people from getting infected. Ask your healthcare provider to call the local or state health department.  Monitor your symptoms Seek prompt medical attention if your illness is worsening (e.g., difficulty breathing). Before going to your medical appointment, call the healthcare provider and tell them that you have, or are being evaluated for, COVID-19 infection. Ask your healthcare provider to call the local or state health department.  Wear a facemask You should wear a facemask that covers your nose and mouth when you are in the same room with other people and when you visit a healthcare provider. People who live with or visit you should also wear a facemask while they are in the same room with you.  Separate yourself from other people in your home As much as possible, you should stay in a different room from other people in your home. Also, you should use a separate bathroom, if available.  Avoid sharing household items You should not  share dishes, drinking glasses, cups, eating utensils, towels, bedding, or other items with other people in your home. After using these items, you should wash them thoroughly with soap and water.  Cover your coughs and sneezes Cover your mouth and nose with a tissue when you cough or sneeze, or you can cough or sneeze into your sleeve. Throw used tissues in a lined trash can, and immediately wash your hands with soap and water for at least 20 seconds or use an alcohol-based hand rub.  Wash your [facility] your hands often and thoroughly with soap and water for at least 20 seconds. You can use an alcohol-based hand sanitizer if soap and water are not available and if your hands are not visibly dirty. Avoid touching your eyes, nose, and mouth with unwashed hands.   Prevention Steps for Caregivers and Household Members of Individuals Confirmed to have, or Being Evaluated for, COVID-19 Infection Being Cared for in the Home  If you live with, or provide care at home for, a person confirmed to have, or being evaluated for, COVID-19 infection please follow these guidelines to prevent infection:  Follow healthcare providers instructions Make sure that you understand and can help the patient follow any healthcare provider instructions for all care.  Provide for the patients basic needs You should help the patient with basic needs in the home and provide support for getting groceries, prescriptions, and other personal needs.  Monitor the patients symptoms If they are getting sicker, call his or her medical provider and tell them that the patient has, or is being evaluated for, COVID-19 infection. This will help the healthcare providers office  take steps to keep other people from getting infected. Ask the healthcare provider to call the local or state health department.  Limit the number of people who have contact with the patient If possible, have only one caregiver for the  patient. Other household members should stay in another home or place of residence. If this is not possible, they should stay in another room, or be separated from the patient as much as possible. Use a separate bathroom, if available. Restrict visitors who do not have an essential need to be in the home.  Keep older adults, very young children, and other sick people away from the patient Keep older adults, very young children, and those who have compromised immune systems or chronic health conditions away from the patient. This includes people with chronic heart, lung, or kidney conditions, diabetes, and cancer.  Ensure good ventilation Make sure that shared spaces in the home have good air flow, such as from an air conditioner or an opened window, weather permitting.  Wash your hands often Wash your hands often and thoroughly with soap and water for at least 20 seconds. You can use an alcohol based hand sanitizer if soap and water are not available and if your hands are not visibly dirty. Avoid touching your eyes, nose, and mouth with unwashed hands. Use disposable paper towels to dry your hands. If not available, use dedicated cloth towels and replace them when they become wet.  Wear a facemask and gloves Wear a disposable facemask at all times in the room and gloves when you touch or have contact with the patients blood, body fluids, and/or secretions or excretions, such as sweat, saliva, sputum, nasal mucus, vomit, urine, or feces.  Ensure the mask fits over your nose and mouth tightly, and do not touch it during use. Throw out disposable facemasks and gloves after using them. Do not reuse. Wash your hands immediately after removing your facemask and gloves. If your personal clothing becomes contaminated, carefully remove clothing and launder. Wash your hands after handling contaminated clothing. Place all used disposable facemasks, gloves, and other waste in a lined container before  disposing them with other household waste. Remove gloves and wash your hands immediately after handling these items.  Do not share dishes, glasses, or other household items with the patient Avoid sharing household items. You should not share dishes, drinking glasses, cups, eating utensils, towels, bedding, or other items with a patient who is confirmed to have, or being evaluated for, COVID-19 infection. After the person uses these items, you should wash them thoroughly with soap and water.  Wash laundry thoroughly Immediately remove and wash clothes or bedding that have blood, body fluids, and/or secretions or excretions, such as sweat, saliva, sputum, nasal mucus, vomit, urine, or feces, on them. Wear gloves when handling laundry from the patient. Read and follow directions on labels of laundry or clothing items and detergent. In general, wash and dry with the warmest temperatures recommended on the label.  Clean all areas the individual has used often Clean all touchable surfaces, such as counters, tabletops, doorknobs, bathroom fixtures, toilets, phones, keyboards, tablets, and bedside tables, every day. Also, clean any surfaces that may have blood, body fluids, and/or secretions or excretions on them. Wear gloves when cleaning surfaces the patient has come in contact with. Use a diluted bleach solution (e.g., dilute bleach with 1 part bleach and 10 parts water) or a household disinfectant with a label that says EPA-registered for coronaviruses. To make a bleach  solution at home, add 1 tablespoon of bleach to 1 quart (4 cups) of water. For a larger supply, add  cup of bleach to 1 gallon (16 cups) of water. Read labels of cleaning products and follow recommendations provided on product labels. Labels contain instructions for safe and effective use of the cleaning product including precautions you should take when applying the product, such as wearing gloves or eye protection and making sure you  have good ventilation during use of the product. Remove gloves and wash hands immediately after cleaning.  Monitor yourself for signs and symptoms of illness Caregivers and household members are considered close contacts, should monitor their health, and will be asked to limit movement outside of the home to the extent possible. Follow the monitoring steps for close contacts listed on the symptom monitoring form.   ? If you have additional questions, contact your local health department or call the epidemiologist on call at [phone] (available 24/7). ? This guidance is subject to change. For the most up-to-date guidance from [facility], please refer to their website: YouBlogs.pl

## 2018-07-04 NOTE — ED Triage Notes (Signed)
Pt in with c/o fever (103F) yesterday, generalized body aches since yesterday. C/o mild cough as well, denies any sob.

## 2018-07-04 NOTE — ED Notes (Signed)
Bedside commode placed in pt's room 

## 2018-07-04 NOTE — ED Provider Notes (Signed)
Patient care assumed from Dr. Christy Gentles at shift change.  Patient awaiting results of her laboratory studies and COVID testing.  Her laboratory studies are essentially unremarkable, however COVID test did return positive.  Patient is not in any respiratory distress and appears clinically well.  There is no hypoxia and vital signs are stable.  I had a discussion with the patient and informed her of this positive results.  Patient is well on this virus and understands to quarantine herself at home and return if her symptoms worsen.  Karen Myers was evaluated in Emergency Department on 07/04/2018 for the symptoms described in the history of present illness. She was evaluated in the context of the global COVID-19 pandemic, which necessitated consideration that the patient might be at risk for infection with the SARS-CoV-2 virus that causes COVID-19. Institutional protocols and algorithms that pertain to the evaluation of patients at risk for COVID-19 are in a state of rapid change based on information released by regulatory bodies including the CDC and federal and state organizations. These policies and algorithms were followed during the patient's care in the ED.    Veryl Speak, MD 07/04/18 [phone]

## 2018-11-16 ENCOUNTER — Ambulatory Visit: Payer: Self-pay | Admitting: Surgery

## 2018-12-04 ENCOUNTER — Other Ambulatory Visit (HOSPITAL_COMMUNITY)
Admission: RE | Admit: 2018-12-04 | Discharge: 2018-12-04 | Disposition: A | Discharge status: Home / Self Care | Payer: Medicare Other | Source: Ambulatory Visit | Attending: Surgery | Admitting: Surgery

## 2018-12-04 ENCOUNTER — Encounter (HOSPITAL_COMMUNITY): Payer: Self-pay | Admitting: Surgery

## 2018-12-04 DIAGNOSIS — Z20828 Contact with and (suspected) exposure to other viral communicable diseases: Secondary | ICD-10-CM | POA: Insufficient documentation

## 2018-12-04 DIAGNOSIS — Z01812 Encounter for preprocedural laboratory examination: Secondary | ICD-10-CM | POA: Insufficient documentation

## 2018-12-04 NOTE — H&P (Signed)
General Surgery [facility]  HARVEY LINGO DOB: 1945/05/14 Married / Language: English / Race: Black or African American Female   History of Present Illness  The patient is a 73 year old female who presents with primary hyperparathyroidism.  CHIEF COMPLAINT: hyperparathyroidism  Patient returns today for follow-up of hyperparathyroidism. Patient has previously undergone a left superior parathyroidectomy. She has had persistently elevated intact PTH levels and serum calcium levels. We have monitored these over the last couple of years. She has had further diagnostic studies including a nuclear medicine parathyroid scan and ultrasound examinations of the neck. It appears from the studies that she may have bilateral inferior parathyroid adenomas. There is no sign of an enlarged parathyroid at the right superior position. Recent laboratory studies from October 12, 2018 show a calcium level of 11.0, and intact PTH level of 138, and a vitamin D level of 30.8. Patient has recovered from her viral illness this summer. She returns today to discuss surgical options and timing of surgery.   Problem List/Past Medical  CHRONIC KIDNEY DISEASE, STAGE 4 (SEVERE) (N18.4)  ELEVATED PARATHYROID HORMONE (E34.9)  PRIMARY HYPERPARATHYROIDISM (E21.0)  VITAMIN D DEFICIENCY (E55.9)   Past Surgical History  Breast Biopsy  Left. Breast Mass; Local Excision  Left. Mastectomy  Bilateral.  Diagnostic Studies History  Colonoscopy  1-5 years ago Pap Smear  1-5 years ago  Allergies  No Known Drug Allergies  Allergies Reconciled   Medication History Acetaminophen (Oral) Specific strength unknown - Active. Atorvastatin Calcium (40MG  Tablet, Oral) Active. DilTIAZem HCl ER Beads (240MG  Capsule ER 24HR, Oral) Active. Losartan Potassium (100MG  Tablet, Oral) Active. Aspirin (81MG  Tablet, Oral) Active. Medications Reconciled  Social History Caffeine use   Carbonated beverages, Coffee, Tea. No drug use  Tobacco use  Former smoker.  Family History Respiratory Condition  Mother.  Pregnancy / Birth History Age at menarche  23 years. Age of men[age]  [age] Contraceptive History  Oral contraceptives. Gravida  2 Maternal age  57-20 Para  2  Other Problems Breast Cancer  Parathyroidectomy  Left. left inferior parathyroidectomy  Vitals  Weight: 174.6 lb Height: 67in Body Surface Area: 1.91 m Body Mass Index: 27.35 kg/m  Temp.: 98.58F  Pulse: 88 (Regular)  BP: 142/82 (Sitting, Left Arm, Standard)  Physical Exam  See vital signs recorded above  GENERAL APPEARANCE Development: normal Nutritional status: normal Gross deformities: none  SKIN Rash, lesions, ulcers: none Induration, erythema: none Nodules: none palpable  EYES Conjunctiva and lids: normal Pupils: equal and reactive Iris: normal bilaterally  EARS, NOSE, MOUTH, THROAT External ears: no lesion or deformity External nose: no lesion or deformity Hearing: grossly normal Patient is wearing a mask.  NECK Symmetric: yes Trachea: midline Thyroid: no palpable nodules in the thyroid bed Well-healed anterior cervical incision mainly to the left of midline  CHEST Respiratory effort: normal Retraction or accessory muscle use: no Breath sounds: normal bilaterally Rales, rhonchi, wheeze: none  CARDIOVASCULAR Auscultation: regular rhythm, normal rate Murmurs: none Pulses: carotid and radial pulse 2+ palpable Lower extremity edema: none Lower extremity varicosities: none  MUSCULOSKELETAL Station and gait: normal Digits and nails: no clubbing or cyanosis Muscle strength: grossly normal all extremities Range of motion: grossly normal all extremities Deformity: none  LYMPHATIC Cervical: none palpable Supraclavicular: none palpable  PSYCHIATRIC Oriented to person, place, and time: yes Mood and affect: normal for situation Judgment  and insight: appropriate for situation    Assessment & Plan   PRIMARY HYPERPARATHYROIDISM (E21.0)  Patient returns today  to discuss parathyroid surgery. We reviewed her most recent laboratory studies and I provided her with a copy of these laboratories to review at home.  Based on her nuclear medicine scan and her ultrasound examinations, there may be bilateral inferior parathyroid adenomas which require surgical removal. There is no evidence of parathyroid disease in the right superior position. The patient has undergone previous left superior parathyroidectomy.  Based on laboratory studies and imaging studies, I have recommended neck exploration. We will plan to evaluate the parathyroid glands on both the left and right side of the neck. We will plan to remove any abnormal or enlarged parathyroid glands which represent parathyroid adenomas. We would plan a overnight hospital stay. We discussed the location of the surgical incision. We discussed postoperative recovery and return to activities. The patient understands and wishes to proceed in the near future.  The risks and benefits of the procedure have been discussed at length with the patient. The patient understands the proposed procedure, potential alternative treatments, and the course of recovery to be expected. All of the patient's questions have been answered at this time. The patient wishes to proceed with surgery.  Armandina Gemma, [facility] Office: [phone]

## 2018-12-05 LAB — NOVEL CORONAVIRUS, NAA (HOSP ORDER, SEND-OUT TO REF LAB; TAT 18-24 HRS): SARS-CoV-2, NAA: NOT DETECTED

## 2018-12-06 ENCOUNTER — Encounter (HOSPITAL_COMMUNITY)
Admission: RE | Admit: 2018-12-06 | Discharge: 2018-12-06 | Disposition: A | Discharge status: Home / Self Care | Payer: Medicare Other | Source: Ambulatory Visit | Attending: Surgery | Admitting: Surgery

## 2018-12-06 ENCOUNTER — Encounter (HOSPITAL_COMMUNITY): Payer: Self-pay

## 2018-12-06 ENCOUNTER — Other Ambulatory Visit: Payer: Self-pay

## 2018-12-06 DIAGNOSIS — Z01812 Encounter for preprocedural laboratory examination: Secondary | ICD-10-CM | POA: Insufficient documentation

## 2018-12-06 LAB — CBC
HCT: 37.8 % (ref 36.0–46.0)
Hemoglobin: 11.5 g/dL — ABNORMAL LOW (ref 12.0–15.0)
MCH: 28.8 pg (ref 26.0–34.0)
MCHC: 30.4 g/dL (ref 30.0–36.0)
MCV: 94.5 fL (ref 80.0–100.0)
Platelets: 294 10*3/uL (ref 150–400)
RBC: 4 MIL/uL (ref 3.87–5.11)
RDW: 13.5 % (ref 11.5–15.5)
WBC: 6.5 10*3/uL (ref 4.0–10.5)
nRBC: 0 % (ref 0.0–0.2)

## 2018-12-06 LAB — BASIC METABOLIC PANEL
Anion gap: 6 (ref 5–15)
BUN: 41 mg/dL — ABNORMAL HIGH (ref 8–23)
CO2: 22 mmol/L (ref 22–32)
Calcium: 10.1 mg/dL (ref 8.9–10.3)
Chloride: 112 mmol/L — ABNORMAL HIGH (ref 98–111)
Creatinine, Ser: 3.21 mg/dL — ABNORMAL HIGH (ref 0.44–1.00)
GFR calc Af Amer: 16 mL/min — ABNORMAL LOW (ref >60)
GFR calc non Af Amer: 14 mL/min — ABNORMAL LOW (ref >60)
Glucose, Bld: 81 mg/dL (ref 70–99)
Potassium: 4.7 mmol/L (ref 3.5–5.1)
Sodium: 140 mmol/L (ref 135–145)

## 2018-12-06 NOTE — Progress Notes (Addendum)
PCP -Dr. Seward Carol  Cardiologist - none  Chest x-ray - 07/04/2018 EKG - 07/04/2018 Stress Test - none ECHO - 2002-in preparation to receive Chemotherapy Cardiac Cath - none  Sleep Study -none  CPAP - none  Fasting Blood Sugar - none Checks Blood Sugar __0___ times a day  Blood Thinner Instructions:none  Aspirin Instructions:from Dr. Delfina Redwood, to stop Aspirin 5 days prior to surgery- for preventive Last Dose:Sunday 12/02/2018  Anesthesia review:  Chart given to Jessica,PA to review!  Patient has a history of Bilateral Mastectomy, HTN, chronic kidney disease  Patient denies shortness of breath, fever, cough and chest pain at PAT appointment   Patient verbalized understanding of instructions that were given to them at the PAT appointment. Patient was also instructed that they will need to review over the PAT instructions again at home before surgery.

## 2018-12-06 NOTE — Patient Instructions (Addendum)
DUE TO COVID-19 ONLY ONE VISITOR IS ALLOWED TO COME WITH YOU AND STAY IN THE WAITING ROOM ONLY DURING PRE OP AND PROCEDURE DAY OF SURGERY. THE 1 VISITOR MAY VISIT WITH YOU AFTER SURGERY IN YOUR PRIVATE ROOM DURING VISITING HOURS ONLY!   ONCE YOUR COVID TEST IS COMPLETED, PLEASE BEGIN THE QUARANTINE INSTRUCTIONS AS OUTLINED IN YOUR HANDOUT.                Karen Myers     Your procedure is scheduled on: Friday 12/07/2018   Report to [facility] Main  Entrance    Report to admitting at  [place]  AM     Call this number if you have problems the morning of surgery [phone]    Remember: Do not eat food or drink liquids :After Midnight.     BRUSH YOUR TEETH MORNING OF SURGERY AND RINSE YOUR MOUTH OUT, NO CHEWING GUM CANDY OR MINTS.     Take these medicines the morning of surgery with A SIP OF WATER: Atorvastatin (Lipitor), Diltiazem (Cardiazem CD)                                You may not have any metal on your body including hair pins and              piercings  Do not wear jewelry, make-up, lotions, powders or perfumes, deodorant             Do not wear nail polish on your fingernails.  Do not shave  48 hours prior to surgery.          Do not bring valuables to the hospital. [place].  Contacts, dentures or bridgework may not be worn into surgery.  Leave suitcase in the car. After surgery it may be brought to your room.                  Please read over the following fact sheets you were given: _____________________________________________________________________             [facility] - Preparing for Surgery Before surgery, you can play an important role.  Because skin is not sterile, your skin needs to be as free of germs as possible.  You can reduce the number of germs on your skin by washing with CHG (chlorahexidine gluconate) soap before surgery.  CHG is an antiseptic cleaner which kills germs and  bonds with the skin to continue killing germs even after washing. Please DO NOT use if you have an allergy to CHG or antibacterial soaps.  If your skin becomes reddened/irritated stop using the CHG and inform your nurse when you arrive at Short Stay. Do not shave (including legs and underarms) for at least 48 hours prior to the first CHG shower.  You may shave your face/neck. Please follow these instructions carefully:  1.  Shower with CHG Soap the night before surgery and the  morning of Surgery.  2.  If you choose to wash your hair, wash your hair first as usual with your  normal  shampoo.  3.  After you shampoo, rinse your hair and body thoroughly to remove the  shampoo.  4.  Use CHG as you would any other liquid soap.  You can apply chg directly  to the skin and wash                       Gently with a scrungie or clean washcloth.  5.  Apply the CHG Soap to your body ONLY FROM THE NECK DOWN.   Do not use on face/ open                           Wound or open sores. Avoid contact with eyes, ears mouth and genitals (private parts).                       Wash face,  Genitals (private parts) with your normal soap.             6.  Wash thoroughly, paying special attention to the area where your surgery  will be performed.  7.  Thoroughly rinse your body with warm water from the neck down.  8.  DO NOT shower/wash with your normal soap after using and rinsing off  the CHG Soap.                9.  Pat yourself dry with a clean towel.            10.  Wear clean pajamas.            11.  Place clean sheets on your bed the night of your first shower and do not  sleep with pets. Day of Surgery : Do not apply any lotions/deodorants the morning of surgery.  Please wear clean clothes to the hospital/surgery center.  FAILURE TO FOLLOW THESE INSTRUCTIONS MAY RESULT IN THE CANCELLATION OF YOUR SURGERY PATIENT SIGNATURE_________________________________  NURSE  SIGNATURE__________________________________  _

## 2018-12-07 ENCOUNTER — Ambulatory Visit (HOSPITAL_COMMUNITY): Payer: Medicare Other | Admitting: Certified Registered Nurse Anesthetist

## 2018-12-07 ENCOUNTER — Encounter (HOSPITAL_COMMUNITY): Payer: Self-pay | Admitting: General Practice

## 2018-12-07 ENCOUNTER — Encounter (HOSPITAL_COMMUNITY)
Admission: RE | Disposition: A | Discharge status: Home / Self Care | Payer: Self-pay | Source: Home / Self Care | Attending: Surgery

## 2018-12-07 ENCOUNTER — Observation Stay (HOSPITAL_COMMUNITY)
Admission: RE | Admit: 2018-12-07 | Discharge: 2018-12-08 | Disposition: A | Discharge status: Home / Self Care | Payer: Medicare Other | Attending: Surgery | Admitting: Surgery

## 2018-12-07 DIAGNOSIS — Z888 Allergy status to other drugs, medicaments and biological substances status: Secondary | ICD-10-CM | POA: Diagnosis not present

## 2018-12-07 DIAGNOSIS — Z853 Personal history of malignant neoplasm of breast: Secondary | ICD-10-CM | POA: Insufficient documentation

## 2018-12-07 DIAGNOSIS — Z7982 Long term (current) use of aspirin: Secondary | ICD-10-CM | POA: Insufficient documentation

## 2018-12-07 DIAGNOSIS — N184 Chronic kidney disease, stage 4 (severe): Secondary | ICD-10-CM | POA: Insufficient documentation

## 2018-12-07 DIAGNOSIS — E210 Primary hyperparathyroidism: Secondary | ICD-10-CM | POA: Diagnosis present

## 2018-12-07 DIAGNOSIS — Z87891 Personal history of nicotine dependence: Secondary | ICD-10-CM | POA: Insufficient documentation

## 2018-12-07 DIAGNOSIS — Z9013 Acquired absence of bilateral breasts and nipples: Secondary | ICD-10-CM | POA: Diagnosis not present

## 2018-12-07 DIAGNOSIS — Z79899 Other long term (current) drug therapy: Secondary | ICD-10-CM | POA: Diagnosis not present

## 2018-12-07 DIAGNOSIS — D351 Benign neoplasm of parathyroid gland: Secondary | ICD-10-CM | POA: Diagnosis not present

## 2018-12-07 HISTORY — PX: PARATHYROIDECTOMY: SHX19

## 2018-12-07 SURGERY — PARATHYROIDECTOMY
Anesthesia: General

## 2018-12-07 MED ORDER — PHENYLEPHRINE 40 MCG/ML (10ML) SYRINGE FOR IV PUSH (FOR BLOOD PRESSURE SUPPORT)
PREFILLED_SYRINGE | INTRAVENOUS | Status: DC | PRN
  Administered 2018-12-07 (×2): 100 ug via INTRAVENOUS
  Administered 2018-12-07 (×2): 60 ug via INTRAVENOUS

## 2018-12-07 MED ORDER — DILTIAZEM HCL ER COATED BEADS 240 MG PO CP24
240.0000 mg | ORAL_CAPSULE | Freq: Every day | ORAL | Status: DC
  Administered 2018-12-08: 09:00:00 240 mg via ORAL
  Filled 2018-12-07: qty 1

## 2018-12-07 MED ORDER — PROPOFOL 10 MG/ML IV BOLUS
INTRAVENOUS | Status: AC
  Filled 2018-12-07: qty 20

## 2018-12-07 MED ORDER — BUPIVACAINE HCL (PF) 0.25 % IJ SOLN
INTRAMUSCULAR | Status: AC
  Filled 2018-12-07: qty 30

## 2018-12-07 MED ORDER — CEFAZOLIN SODIUM-DEXTROSE 2-4 GM/100ML-% IV SOLN
2.0000 g | INTRAVENOUS | Status: AC
  Administered 2018-12-07: 2 g via INTRAVENOUS
  Filled 2018-12-07: qty 100

## 2018-12-07 MED ORDER — TRAMADOL HCL 50 MG PO TABS: 50.0000 mg | ORAL_TABLET | Freq: Four times a day (QID) | ORAL | Status: DC | PRN

## 2018-12-07 MED ORDER — OXYCODONE HCL 5 MG PO TABS: 5.0000 mg | ORAL_TABLET | Freq: Once | ORAL | Status: DC | PRN

## 2018-12-07 MED ORDER — ONDANSETRON HCL 4 MG/2ML IJ SOLN: 4.0000 mg | Freq: Four times a day (QID) | INTRAMUSCULAR | Status: DC | PRN

## 2018-12-07 MED ORDER — LOSARTAN POTASSIUM 50 MG PO TABS
100.0000 mg | ORAL_TABLET | Freq: Every day | ORAL | Status: DC
  Administered 2018-12-08: 100 mg via ORAL
  Filled 2018-12-07: qty 2

## 2018-12-07 MED ORDER — ONDANSETRON 4 MG PO TBDP: 4.0000 mg | ORAL_TABLET | Freq: Four times a day (QID) | ORAL | Status: DC | PRN

## 2018-12-07 MED ORDER — EPHEDRINE SULFATE-NACL 50-0.9 MG/10ML-% IV SOSY
PREFILLED_SYRINGE | INTRAVENOUS | Status: DC | PRN
  Administered 2018-12-07: 10 mg via INTRAVENOUS

## 2018-12-07 MED ORDER — TRAMADOL HCL 50 MG PO TABS: 50.0000 mg | ORAL_TABLET | Freq: Four times a day (QID) | ORAL | 0 refills | Status: DC | PRN

## 2018-12-07 MED ORDER — FENTANYL CITRATE (PF) 250 MCG/5ML IJ SOLN
INTRAMUSCULAR | Status: AC
  Filled 2018-12-07: qty 5

## 2018-12-07 MED ORDER — CHLORHEXIDINE GLUCONATE CLOTH 2 % EX PADS: 6.0000 | MEDICATED_PAD | Freq: Once | CUTANEOUS | Status: DC

## 2018-12-07 MED ORDER — FENTANYL CITRATE (PF) 100 MCG/2ML IJ SOLN
INTRAMUSCULAR | Status: DC | PRN
  Administered 2018-12-07 (×2): 50 ug via INTRAVENOUS
  Administered 2018-12-07: 100 ug via INTRAVENOUS
  Administered 2018-12-07: 50 ug via INTRAVENOUS

## 2018-12-07 MED ORDER — ONDANSETRON HCL 4 MG/2ML IJ SOLN
INTRAMUSCULAR | Status: AC
  Filled 2018-12-07: qty 2

## 2018-12-07 MED ORDER — ONDANSETRON HCL 4 MG/2ML IJ SOLN
INTRAMUSCULAR | Status: DC | PRN
  Administered 2018-12-07: 4 mg via INTRAVENOUS

## 2018-12-07 MED ORDER — FENTANYL CITRATE (PF) 100 MCG/2ML IJ SOLN: 25.0000 ug | INTRAMUSCULAR | Status: DC | PRN

## 2018-12-07 MED ORDER — LIDOCAINE 2% (20 MG/ML) 5 ML SYRINGE
INTRAMUSCULAR | Status: DC | PRN
  Administered 2018-12-07: 50 mg via INTRAVENOUS

## 2018-12-07 MED ORDER — HYDROMORPHONE HCL 1 MG/ML IJ SOLN
INTRAMUSCULAR | Status: DC | PRN
  Administered 2018-12-07: 0.5 mg via INTRAVENOUS

## 2018-12-07 MED ORDER — PROPOFOL 10 MG/ML IV BOLUS
INTRAVENOUS | Status: DC | PRN
  Administered 2018-12-07: 140 mg via INTRAVENOUS

## 2018-12-07 MED ORDER — ACETAMINOPHEN 325 MG PO TABS: 650.0000 mg | ORAL_TABLET | Freq: Four times a day (QID) | ORAL | Status: DC | PRN

## 2018-12-07 MED ORDER — ACETAMINOPHEN 650 MG RE SUPP: 650.0000 mg | Freq: Four times a day (QID) | RECTAL | Status: DC | PRN

## 2018-12-07 MED ORDER — HYDROCODONE-ACETAMINOPHEN 5-325 MG PO TABS
1.0000 | ORAL_TABLET | ORAL | Status: DC | PRN
  Administered 2018-12-07 (×2): 1 via ORAL
  Filled 2018-12-07 (×2): qty 1

## 2018-12-07 MED ORDER — ROCURONIUM BROMIDE 10 MG/ML (PF) SYRINGE
PREFILLED_SYRINGE | INTRAVENOUS | Status: DC | PRN
  Administered 2018-12-07: 10 mg via INTRAVENOUS
  Administered 2018-12-07: 50 mg via INTRAVENOUS
  Administered 2018-12-07: 10 mg via INTRAVENOUS

## 2018-12-07 MED ORDER — DEXAMETHASONE SODIUM PHOSPHATE 10 MG/ML IJ SOLN
INTRAMUSCULAR | Status: AC
  Filled 2018-12-07: qty 1

## 2018-12-07 MED ORDER — LIDOCAINE 2% (20 MG/ML) 5 ML SYRINGE
INTRAMUSCULAR | Status: AC
  Filled 2018-12-07: qty 5

## 2018-12-07 MED ORDER — DEXAMETHASONE SODIUM PHOSPHATE 10 MG/ML IJ SOLN
INTRAMUSCULAR | Status: DC | PRN
  Administered 2018-12-07: 8 mg via INTRAVENOUS

## 2018-12-07 MED ORDER — HYDROMORPHONE HCL 1 MG/ML IJ SOLN: 1.0000 mg | INTRAMUSCULAR | Status: DC | PRN

## 2018-12-07 MED ORDER — SUGAMMADEX SODIUM 200 MG/2ML IV SOLN
INTRAVENOUS | Status: DC | PRN
  Administered 2018-12-07: 160 mg via INTRAVENOUS

## 2018-12-07 MED ORDER — HYDROMORPHONE HCL 2 MG/ML IJ SOLN
INTRAMUSCULAR | Status: AC
  Filled 2018-12-07: qty 1

## 2018-12-07 MED ORDER — LACTATED RINGERS IV SOLN
INTRAVENOUS | Status: DC
  Administered 2018-12-07: 07:00:00 via INTRAVENOUS

## 2018-12-07 MED ORDER — OXYCODONE HCL 5 MG/5ML PO SOLN: 5.0000 mg | Freq: Once | ORAL | Status: DC | PRN

## 2018-12-07 MED ORDER — PHENYLEPHRINE 40 MCG/ML (10ML) SYRINGE FOR IV PUSH (FOR BLOOD PRESSURE SUPPORT)
PREFILLED_SYRINGE | INTRAVENOUS | Status: AC
  Filled 2018-12-07: qty 20

## 2018-12-07 MED ORDER — KCL IN DEXTROSE-NACL 20-5-0.45 MEQ/L-%-% IV SOLN
INTRAVENOUS | Status: DC
  Filled 2018-12-07: qty 1000

## 2018-12-07 MED ORDER — ROCURONIUM BROMIDE 10 MG/ML (PF) SYRINGE
PREFILLED_SYRINGE | INTRAVENOUS | Status: AC
  Filled 2018-12-07: qty 10

## 2018-12-07 SURGICAL SUPPLY — 33 items
ADH SKN CLS APL DERMABOND .7 (GAUZE/BANDAGES/DRESSINGS) ×1
APL PRP STRL LF DISP 70% ISPRP (MISCELLANEOUS) ×1
ATTRACTOMAT 16X20 MAGNETIC DRP (DRAPES) ×2 IMPLANT
BLADE SURG 15 STRL LF DISP TIS (BLADE) ×1 IMPLANT
BLADE SURG 15 STRL SS (BLADE) ×2
CHLORAPREP W/TINT 26 (MISCELLANEOUS) ×2 IMPLANT
CLIP VESOCCLUDE MED 6/CT (CLIP) ×5 IMPLANT
CLIP VESOCCLUDE SM WIDE 6/CT (CLIP) ×5 IMPLANT
COVER SURGICAL LIGHT HANDLE (MISCELLANEOUS) ×2 IMPLANT
COVER WAND RF STERILE (DRAPES) ×1 IMPLANT
DERMABOND ADVANCED (GAUZE/BANDAGES/DRESSINGS) ×1
DERMABOND ADVANCED .7 DNX12 (GAUZE/BANDAGES/DRESSINGS) IMPLANT
DRAPE LAPAROTOMY T 98X78 PEDS (DRAPES) ×2 IMPLANT
ELECT PENCIL ROCKER SW 15FT (MISCELLANEOUS) ×2 IMPLANT
ELECT REM PT RETURN 15FT ADLT (MISCELLANEOUS) ×2 IMPLANT
GAUZE 4X4 16PLY RFD (DISPOSABLE) ×2 IMPLANT
GLOVE SURG ORTHO 8.0 STRL STRW (GLOVE) ×7 IMPLANT
GOWN STRL REUS W/TWL XL LVL3 (GOWN DISPOSABLE) ×6 IMPLANT
HEMOSTAT SURGICEL 2X4 FIBR (HEMOSTASIS) ×1 IMPLANT
ILLUMINATOR WAVEGUIDE N/F (MISCELLANEOUS) IMPLANT
KIT BASIN OR (CUSTOM PROCEDURE TRAY) ×2 IMPLANT
KIT TURNOVER KIT A (KITS) ×1 IMPLANT
NDL HYPO 25X1 1.5 SAFETY (NEEDLE) ×1 IMPLANT
NEEDLE HYPO 25X1 1.5 SAFETY (NEEDLE) ×2 IMPLANT
PACK BASIC VI WITH GOWN DISP (CUSTOM PROCEDURE TRAY) ×2 IMPLANT
SHEARS HARMONIC 9CM CVD (BLADE) ×1 IMPLANT
SUT MNCRL AB 4-0 PS2 18 (SUTURE) ×2 IMPLANT
SUT VIC AB 3-0 SH 18 (SUTURE) ×3 IMPLANT
SYR BULB IRRIGATION 50ML (SYRINGE) ×2 IMPLANT
SYR CONTROL 10ML LL (SYRINGE) ×2 IMPLANT
TOWEL OR 17X26 10 PK STRL BLUE (TOWEL DISPOSABLE) ×2 IMPLANT
TOWEL OR NON WOVEN STRL DISP B (DISPOSABLE) ×2 IMPLANT
TUBING CONNECTING 10 (TUBING) ×2 IMPLANT

## 2018-12-07 NOTE — Anesthesia Preprocedure Evaluation (Signed)
Anesthesia Evaluation  Patient identified by MRN, date of birth, ID band Patient awake    Reviewed: Allergy & Precautions, H&P , NPO status , Patient's Chart, lab work & pertinent test results  Airway Mallampati: II   Neck ROM: full    Dental   Pulmonary former smoker,    breath sounds clear to auscultation       Cardiovascular hypertension,  Rhythm:regular Rate:Normal     Neuro/Psych    GI/Hepatic GERD  ,  Endo/Other    Renal/GU Renal InsufficiencyRenal disease     Musculoskeletal  (+) Arthritis ,   Abdominal   Peds  Hematology   Anesthesia Other Findings   Reproductive/Obstetrics Breast CA                             Anesthesia Physical Anesthesia Plan  ASA: III  Anesthesia Plan: General   Post-op Pain Management:    Induction: Intravenous  PONV Risk Score and Plan: 3 and Ondansetron, Dexamethasone and Treatment may vary due to age or medical condition  Airway Management Planned: Oral ETT  Additional Equipment:   Intra-op Plan:   Post-operative Plan: Extubation in OR  Informed Consent: I have reviewed the patients History and Physical, chart, labs and discussed the procedure including the risks, benefits and alternatives for the proposed anesthesia with the patient or authorized representative who has indicated his/her understanding and acceptance.       Plan Discussed with: CRNA, Anesthesiologist and Surgeon  Anesthesia Plan Comments:         Anesthesia Quick Evaluation

## 2018-12-07 NOTE — Op Note (Signed)
Operative Note  Pre-operative Diagnosis:  Primary hyperparathyroidism  Post-operative Diagnosis:  same  Surgeon:  Armandina Gemma, MD  Assistant:  none   Procedure:  Neck exploration, right inferior parathyroidectomy, left thyroid lobectomy (intrathyroidal parathyroid)  Anesthesia:  general  Estimated Blood Loss:  50 cc  Drains: none         Specimen: to pathology  Indications:  Patient returns today for follow-up of hyperparathyroidism. Patient has previously undergone a left superior parathyroidectomy. She has had persistently elevated intact PTH levels and serum calcium levels. We have monitored these over the last couple of years. She has had further diagnostic studies including a nuclear medicine parathyroid scan and ultrasound examinations of the neck. It appears from the studies that she may have bilateral inferior parathyroid adenomas. There is no sign of an enlarged parathyroid at the right superior position. Recent laboratory studies from October 12, 2018 show a calcium level of 11.0, and intact PTH level of 138, and a vitamin D level of 30.8. Patient has recovered from her viral illness this summer. She returns today to discuss surgical options and timing of surgery.  Procedure:  The patient was seen in the pre-op holding area. The risks, benefits, complications, treatment options, and expected outcomes were previously discussed with the patient. The patient agreed with the proposed plan and has signed the informed consent form.  The patient was brought to the operating room by the surgical team, identified as Karen Myers and the procedure verified. A "time out" was completed and the above information confirmed.  Following administration of general endotracheal anesthesia, the patient is positioned and then prepped and draped in the usual aseptic fashion.  After ascertaining that an adequate level of anesthesia been achieved, the previous cervical incision is  reopened with a #15 blade and extended to the right.  Dissection was carried through scar tissue and the platysma.  Skin flaps are elevated cephalad and caudad.  A [place] self-retaining retractors placed for exposure.  Initial exploration is on the left side.  Strap muscles are reflected laterally exposing the left thyroid lobe.  Some scar tissue from her previous surgery is encountered.  Exploration around the inferior pole of the thyroid fails to reveal any evidence of enlarged parathyroid gland.  The thyroid thymic tract is dissected out and resected.  It does not appear to contain any glandular tissue.  Recurrent laryngeal nerve is identified and preserved along its course in the tracheoesophageal groove.  Further exploration posteriorly to the precervical fascia fails to reveal an enlarged parathyroid gland.  The left thyroid lobe is thoroughly explored along its surface and there is no evidence of an enlarged parathyroid gland.  Dry pack is placed in the left neck.  Next we explored the right side.  Strap muscles are reflected laterally exposing the right thyroid lobe.  The lobe is mildly enlarged but appears otherwise normal.  The right lobe is mobilized and posterior to the inferior pole was a palpable abnormality adjacent to the esophagus.  This is gently dissected out.  This appears to be an enlarged parathyroid gland consistent with adenoma.  Gland is mobilized and dissected out on its vascular pedicle.  Vascular structures are divided between small ligaclips and the gland is excised.  It measures approximately 2.5 cm in length.  It is submitted for frozen section.  Dr. Claudette Laws confirms parathyroid tissue consistent with parathyroid adenoma.  There are some abnormalities to the gland raising the possibility of a minimally invasive tumor.  There  is no sign of local spread or metastasis.  After reviewing the preoperative ultrasound and nuclear medicine studies, decision is made to proceed with  left thyroid lobectomy for the possibility of an intrathyroidal parathyroid adenoma.  Therefore the left lobe is fully mobilized.  Superior pole vessels are divided between small and medium ligaclips with the harmonic scalpel.  Gland is rolled anteriorly.  Branches of the inferior thyroid artery are divided between small and medium ligaclips with the harmonic scalpel.  Recurrent nerve is identified and preserved along its course.  Ligament of Gwenlyn Found is released with the electrocautery and the lobe was mobilized up and onto the anterior trachea.  Isthmus is mobilized across the midline.  Isthmus is transected at its junction with the right thyroid lobe using the harmonic scalpel.  The left lobe is sectioned on the table and parathyroid tissue is identified near the inferior pole.  This appears to be intrathyroidal.  It is marked with a suture and submitted to pathology where frozen section by Dr. Claudette Laws confirms parathyroid tissue which is quite similar to the tissue seen in the previous frozen section biopsy on the right inferior gland.  Neck is irrigated with warm saline.  Good hemostasis is noted throughout the operative field.  Fibrillar is placed throughout the operative field.  Strap muscles are reapproximated in the midline of interrupted 3-0 Vicryl sutures.  Platysma was closed with interrupted 3-0 Vicryl sutures.  Skin is closed with a running 4-0 Monocryl subcuticular suture.  Wound is washed and dried and Dermabond is applied as dressing.  Patient is awakened from anesthesia and transported to the recovery room.  The patient tolerated the procedure well.   Armandina Gemma, MD [facility] Office: [phone]

## 2018-12-07 NOTE — Interval H&P Note (Signed)
History and Physical Interval Note:  12/07/2018 8:31 AM  Karen Myers  has presented today for surgery, with the diagnosis of PRIMARY HYPERTHYROIDISM.  The various methods of treatment have been discussed with the patient and family. After consideration of risks, benefits and other options for treatment, the patient has consented to    Procedure(s): NECK EXPLORATION, PARATHYROIDECTOMY (N/A) as a surgical intervention.    The patient's history has been reviewed, patient examined, no change in status, stable for surgery.  I have reviewed the patient's chart and labs.  Questions were answered to the patient's satisfaction.    Armandina Gemma, [place] [facility] Office: [place]

## 2018-12-07 NOTE — Discharge Instructions (Signed)
[facility]  THYROID & PARATHYROID SURGERY:  POST-OP INSTRUCTIONS  Always review your discharge instruction sheet from the facility where your surgery was performed.  A prescription for pain medication may be given to you upon discharge.  Take your pain medication as prescribed.  If narcotic pain medicine is not needed, then you may take acetaminophen (Tylenol) or ibuprofen (Advil) as needed.  Take your usually prescribed medications unless otherwise directed.  If you need a refill on your pain medication, please contact our office during regular business hours.  Prescriptions cannot be processed by our office after 5 pm or on weekends.  Start with a light diet upon arrival home, such as soup and crackers or toast.  Be sure to drink plenty of fluids daily.  Resume your normal diet the day after surgery.  Most patients will experience some swelling and bruising on the chest and neck area.  Ice packs will help.  Swelling and bruising can take several days to resolve.   It is common to experience some constipation after surgery.  Increasing fluid intake and taking a stool softener (Colace) will usually help or prevent this problem.  A mild laxative (Milk of Magnesia or Miralax) should be taken according to package directions if there has been no bowel movement after 48 hours.  You have steri-strips and a gauze dressing over your incision.  You may remove the gauze bandage on the second day after surgery, and you may shower at that time.  Leave your steri-strips (small skin tapes) in place directly over the incision.  These strips should remain on the skin for 5-7 days and then be removed.  You may get them wet in the shower and pat them dry.  You may resume regular (light) daily activities beginning the next day (such as daily self-care, walking, climbing stairs) gradually increasing activities as tolerated.  You may have sexual intercourse when it is comfortable.  Refrain from  any heavy lifting or straining until approved by your doctor.  You may drive when you no longer are taking prescription pain medication, you can comfortably wear a seatbelt, and you can safely maneuver your car and apply brakes.  You should see your doctor in the office for a follow-up appointment approximately three weeks after your surgery.  Make sure that you call for this appointment within a day or two after you arrive home to insure a convenient appointment time.  WHEN TO CALL YOUR DOCTOR: -- Fever greater than 101.5 -- Inability to urinate -- Nausea and/or vomiting - persistent -- Extreme swelling or bruising -- Continued bleeding from incision -- Increased pain, redness, or drainage from the incision -- Difficulty swallowing or breathing -- Muscle cramping or spasms -- Numbness or tingling in hands or around lips  The clinic staff is available to answer your questions during regular business hours.  Please don't hesitate to call and ask to speak to one of the nurses if you have concerns.  Danaiya Steadman, MD [facility] Office: [phone] 

## 2018-12-07 NOTE — Anesthesia Procedure Notes (Signed)
Procedure Name: Intubation Date/Time: 12/07/2018 8:51 AM Performed by: Anne Fu, CRNA Pre-anesthesia Checklist: Patient identified, Emergency Drugs available, Suction available, Patient being monitored and Timeout performed Patient Re-evaluated:Patient Re-evaluated prior to induction Oxygen Delivery Method: Circle system utilized Preoxygenation: Pre-oxygenation with 100% oxygen Induction Type: IV induction Ventilation: Mask ventilation without difficulty Laryngoscope Size: Mac and 4 Grade View: Grade I Tube type: Oral Tube size: 7.5 mm Number of attempts: 1 Airway Equipment and Method: Stylet Placement Confirmation: ETT inserted through vocal cords under direct vision,  positive ETCO2 and breath sounds checked- equal and bilateral Secured at: 22 cm Tube secured with: Tape Dental Injury: Teeth and Oropharynx as per pre-operative assessment

## 2018-12-07 NOTE — Transfer of Care (Signed)
Immediate Anesthesia Transfer of Care Note  Patient: Karen Myers  Procedure(s) Performed: Procedure(s): NECK EXPLORATION, RIGHT INFERIOR PARATHYROIDECTOMY, LEFT THYROID LOBECTOMY (N/A)  Patient Location: PACU  Anesthesia Type:General  Level of Consciousness:  sedated, patient cooperative and responds to stimulation  Airway & Oxygen Therapy:Patient Spontanous Breathing and Patient connected to face mask oxgen  Post-op Assessment:  Report given to PACU RN and Post -op Vital signs reviewed and stable  Post vital signs:  Reviewed and stable  Last Vitals:  Vitals:   12/07/18 0644 12/07/18 1111  BP: (!) 148/87 (!) 152/76  Pulse: 68 77  Resp: 16 13  Temp: 36.7 C   SpO2: 290% 379%    Complications: No apparent anesthesia complications

## 2018-12-08 DIAGNOSIS — E210 Primary hyperparathyroidism: Secondary | ICD-10-CM | POA: Diagnosis not present

## 2018-12-08 LAB — BASIC METABOLIC PANEL
Anion gap: 11 (ref 5–15)
BUN: 44 mg/dL — ABNORMAL HIGH (ref 8–23)
CO2: 19 mmol/L — ABNORMAL LOW (ref 22–32)
Calcium: 10.4 mg/dL — ABNORMAL HIGH (ref 8.9–10.3)
Chloride: 107 mmol/L (ref 98–111)
Creatinine, Ser: 2.68 mg/dL — ABNORMAL HIGH (ref 0.44–1.00)
GFR calc Af Amer: 20 mL/min — ABNORMAL LOW (ref >60)
GFR calc non Af Amer: 17 mL/min — ABNORMAL LOW (ref >60)
Glucose, Bld: 159 mg/dL — ABNORMAL HIGH (ref 70–99)
Potassium: 5.4 mmol/L — ABNORMAL HIGH (ref 3.5–5.1)
Sodium: 137 mmol/L (ref 135–145)

## 2018-12-08 NOTE — Plan of Care (Signed)
Assessment unchanged. Pt verbalized understanding of dc instructions through teach back including when to call the doctor. Discharged via wc to front entrance accompanied by NT.

## 2018-12-08 NOTE — Discharge Summary (Signed)
Physician Discharge Summary  Patient ID: Karen Myers MRN: [id number] DOB/AGE: 1945/05/27 73 y.o.  Admit date: 12/07/2018 Discharge date: 12/08/2018  Admission Diagnoses: Active Problems:   Hyperparathyroidism, primary ([place])   Primary hyperparathyroidism ([place])  Discharge Diagnoses:  Active Problems:   Hyperparathyroidism, primary ([place])   Primary hyperparathyroidism ([place])   Discharged Condition: good  Hospital Course: Pt did well overnight  Consults: None  Significant Diagnostic Studies: labs: bmet  Treatments: IV hydration, analgesia: acetaminophen and surgery: Neck exploration, right inferior parathyroidectomy, left thyroid lobectomy (intrathyroidal parathyroid)  Discharge Exam: Blood pressure (!) 160/99, pulse 93, temperature 98.1 F (36.7 C), temperature source Oral, resp. rate 20, height 5\' 7"  (1.702 m), weight 77.2 kg, SpO2 97 %. General appearance: alert and cooperative Neck: mild edema around incision, voice without hoarseness  Disposition: Discharge disposition: 01-Home or Self Care        Allergies as of 12/08/2018      Reactions   Altace [ramipril] Swelling   Swelling of lips, hypotension      Medication List    TAKE these medications   acetaminophen 500 MG tablet Commonly known as: TYLENOL Take 1,000 mg by mouth 2 (two) times daily.   aspirin EC 81 MG tablet Take 81 mg by mouth daily.   atorvastatin 40 MG tablet Commonly known as: LIPITOR Take 40 mg by mouth daily.   diltiazem 240 MG 24 hr capsule Commonly known as: CARDIZEM CD Take 240 mg by mouth daily.   losartan 100 MG tablet Commonly known as: COZAAR Take 100 mg by mouth daily.   traMADol 50 MG tablet Commonly known as: ULTRAM Take 1-2 tablets (50-100 mg total) by mouth every 6 (six) hours as needed.      Follow-up Information    Armandina Gemma, MD. Schedule an appointment as soon as possible for a visit in 3 weeks.   Specialty: General Surgery Why: For wound  re-check Contact information: [address] [place] [address] [phone]           Signed: Rosario Adie 57/47/3403, 9:21 AM

## 2018-12-10 ENCOUNTER — Encounter (HOSPITAL_COMMUNITY): Payer: Self-pay | Admitting: Surgery

## 2018-12-10 LAB — SURGICAL PATHOLOGY

## 2018-12-10 NOTE — Anesthesia Postprocedure Evaluation (Signed)
Anesthesia Post Note  Patient: Karen Myers  Procedure(s) Performed: NECK EXPLORATION, RIGHT INFERIOR PARATHYROIDECTOMY, LEFT THYROID LOBECTOMY (N/A )     Patient location during evaluation: PACU Anesthesia Type: General Level of consciousness: awake and alert Pain management: pain level controlled Vital Signs Assessment: post-procedure vital signs reviewed and stable Respiratory status: spontaneous breathing, nonlabored ventilation, respiratory function stable and patient connected to nasal cannula oxygen Cardiovascular status: blood pressure returned to baseline and stable Postop Assessment: no apparent nausea or vomiting Anesthetic complications: no    Last Vitals:  Vitals:   12/08/18 0213 12/08/18 0644  BP: (!) 160/99 (!) 160/99  Pulse: 88 93  Resp: 20 20  Temp: 36.9 C 36.7 C  SpO2: 95% 97%    Last Pain:  Vitals:   12/08/18 0900  TempSrc:   PainSc: 0-No pain                 Shylie Polo S

## 2019-07-13 ENCOUNTER — Encounter (HOSPITAL_COMMUNITY): Payer: Self-pay

## 2019-07-13 ENCOUNTER — Emergency Department (HOSPITAL_COMMUNITY)
Admission: EM | Admit: 2019-07-13 | Discharge: 2019-07-13 | Disposition: A | Discharge status: Home / Self Care | Payer: Medicare Other | Attending: Emergency Medicine | Admitting: Emergency Medicine

## 2019-07-13 ENCOUNTER — Other Ambulatory Visit: Payer: Self-pay

## 2019-07-13 ENCOUNTER — Emergency Department (HOSPITAL_COMMUNITY): Payer: Medicare Other

## 2019-07-13 DIAGNOSIS — R110 Nausea: Secondary | ICD-10-CM | POA: Insufficient documentation

## 2019-07-13 DIAGNOSIS — Z7982 Long term (current) use of aspirin: Secondary | ICD-10-CM | POA: Insufficient documentation

## 2019-07-13 DIAGNOSIS — N184 Chronic kidney disease, stage 4 (severe): Secondary | ICD-10-CM | POA: Insufficient documentation

## 2019-07-13 DIAGNOSIS — Z79899 Other long term (current) drug therapy: Secondary | ICD-10-CM | POA: Insufficient documentation

## 2019-07-13 DIAGNOSIS — Z87891 Personal history of nicotine dependence: Secondary | ICD-10-CM | POA: Diagnosis not present

## 2019-07-13 DIAGNOSIS — I129 Hypertensive chronic kidney disease with stage 1 through stage 4 chronic kidney disease, or unspecified chronic kidney disease: Secondary | ICD-10-CM | POA: Diagnosis not present

## 2019-07-13 DIAGNOSIS — Z853 Personal history of malignant neoplasm of breast: Secondary | ICD-10-CM | POA: Diagnosis not present

## 2019-07-13 DIAGNOSIS — N179 Acute kidney failure, unspecified: Secondary | ICD-10-CM | POA: Diagnosis not present

## 2019-07-13 DIAGNOSIS — R079 Chest pain, unspecified: Secondary | ICD-10-CM

## 2019-07-13 LAB — BASIC METABOLIC PANEL
Anion gap: 14 (ref 5–15)
BUN: 50 mg/dL — ABNORMAL HIGH (ref 8–23)
CO2: 16 mmol/L — ABNORMAL LOW (ref 22–32)
Calcium: 9.7 mg/dL (ref 8.9–10.3)
Chloride: 110 mmol/L (ref 98–111)
Creatinine, Ser: 3.72 mg/dL — ABNORMAL HIGH (ref 0.44–1.00)
GFR calc Af Amer: 13 mL/min — ABNORMAL LOW (ref >60)
GFR calc non Af Amer: 12 mL/min — ABNORMAL LOW (ref >60)
Glucose, Bld: 85 mg/dL (ref 70–99)
Potassium: 4.6 mmol/L (ref 3.5–5.1)
Sodium: 140 mmol/L (ref 135–145)

## 2019-07-13 LAB — CBC
HCT: 40.1 % (ref 36.0–46.0)
Hemoglobin: 12.1 g/dL (ref 12.0–15.0)
MCH: 29.4 pg (ref 26.0–34.0)
MCHC: 30.2 g/dL (ref 30.0–36.0)
MCV: 97.6 fL (ref 80.0–100.0)
Platelets: 221 10*3/uL (ref 150–400)
RBC: 4.11 MIL/uL (ref 3.87–5.11)
RDW: 14.2 % (ref 11.5–15.5)
WBC: 9.1 10*3/uL (ref 4.0–10.5)
nRBC: 0 % (ref 0.0–0.2)

## 2019-07-13 LAB — TROPONIN I (HIGH SENSITIVITY)
Troponin I (High Sensitivity): 8 ng/L (ref <18)
Troponin I (High Sensitivity): 6 ng/L (ref <18)

## 2019-07-13 MED ORDER — SODIUM CHLORIDE 0.9% FLUSH: 3.0000 mL | Freq: Once | INTRAVENOUS | Status: DC

## 2019-07-13 NOTE — Discharge Instructions (Addendum)
Follow-up closely with your kidney doctor as you are renal function has worsened as discussed. Continue to avoid ibuprofen/NSAID medications. Your heart blood tests were normal today. Discussed stress test and further evaluation with the cardiologist soon as possible.  Continue to take your baby aspirin a day. Return for new or worsening symptoms.

## 2019-07-13 NOTE — ED Notes (Signed)
Patient verbalizes understanding of discharge instructions. Opportunity for questioning and answers were provided. Armband removed by staff, pt discharged from ED to home 

## 2019-07-13 NOTE — ED Notes (Signed)
Pt 1st trop drawn a 1830, will draw 2nd at 2030

## 2019-07-13 NOTE — ED Triage Notes (Signed)
Onset this afternoon pt was driving and started having chest pressure and shortness of breath.  Pt pulled over to parking lot, after 30 min pain and shortness of breath subsided.  No pain at this time.

## 2019-07-13 NOTE — ED Provider Notes (Signed)
[facility] EMERGENCY DEPARTMENT Provider Note   CSN: [id number] Arrival date & time: 07/13/19  1630     History Chief Complaint  Patient presents with  . Chest Pain    Karen Myers is a 74 y.o. female.  Patient presents after episode of chest pain that started at 430 today lasted 45 minutes. Patient did get nauseous with it. Patient pulled over the parking lot and the chest pain resolved. She was breathing faster no significant shortness of breath. Patient has no symptoms at this time. Patient has a history of breast cancer years ago and has had no recurrence and no current treatment. No history of blood clots. No recent surgery, no leg swelling no heart failure history. Patient has a history of high cholesterol and high blood pressure. Patient is followed closely for her worsening kidney disease stage IV.        Past Medical History:  Diagnosis Date  . Arthritis   . Breast cancer ([place])    left breast cancer   . Breast cancer ([place])   . Chronic kidney disease   . CIN III (cervical intraepithelial neoplasia III)    prior to LEEP procedure  . GERD (gastroesophageal reflux disease)   . High cholesterol   . Hypertension     Patient Active Problem List   Diagnosis Date Noted  . Primary hyperparathyroidism ([place]) 12/07/2018  . Hyperparathyroidism, primary ([place]) 05/27/2017  . CIN 3 - cervical intraepithelial neoplasia grade 3 10/06/2011  . Breast CA ([place]) 12/02/2010  . Renal insufficiency 12/02/2010  . Hypertension 12/02/2010  . Hyperlipidemia 12/02/2010    Past Surgical History:  Procedure Laterality Date  . BREAST LUMPECTOMY    . CERVICAL BIOPSY  W/ LOOP ELECTRODE EXCISION  2005  . COLONOSCOPY    . HYSTEROSCOPY WITH D & C  2005  . MASTECTOMY  2002   bilateral   . PARATHYROIDECTOMY N/A 06/01/2017   Procedure: PARATHYROIDECTOMY;  Surgeon: Armandina Gemma, MD;  Location: [facility];  Service: General;  Laterality: N/A;  . PARATHYROIDECTOMY N/A  12/07/2018   Procedure: NECK EXPLORATION, RIGHT INFERIOR PARATHYROIDECTOMY, LEFT THYROID LOBECTOMY;  Surgeon: Armandina Gemma, MD;  Location: [facility];  Service: General;  Laterality: N/A;  . POLYPECTOMY    . RENAL BIOPSY    . VULVA /PERINEUM BIOPSY     nevus on vulva      OB History    Gravida  2   Para  2   Term      Preterm      AB      Living  2     SAB      TAB      Ectopic      Multiple      Live Births              History reviewed. No pertinent family history.  Social History   Tobacco Use  . Smoking status: Former Research scientist (life sciences)  . Smokeless tobacco: Never Used  Substance Use Topics  . Alcohol use: No  . Drug use: No    Home Medications Prior to Admission medications   Medication Sig Start Date End Date Taking? Authorizing Provider  acetaminophen (TYLENOL) 500 MG tablet Take 1,000 mg by mouth 2 (two) times daily.    [provider]  aspirin EC 81 MG tablet Take 81 mg by mouth daily.    [provider]  atorvastatin (LIPITOR) 40 MG tablet Take 40 mg by mouth daily.  [provider]  diltiazem (CARDIZEM CD) 240 MG 24 hr capsule Take 240 mg by mouth daily. 04/25/17   [provider]  losartan (COZAAR) 100 MG tablet Take 100 mg by mouth daily.    [provider]  traMADol (ULTRAM) 50 MG tablet Take 1-2 tablets (50-100 mg total) by mouth every 6 (six) hours as needed. 12/07/18   Armandina Gemma, MD    Allergies    Altace [ramipril]  Review of Systems   Review of Systems  Constitutional: Negative for chills, diaphoresis and fever.  HENT: Negative for congestion.   Eyes: Negative for visual disturbance.  Respiratory: Negative for shortness of breath.   Cardiovascular: Positive for chest pain. Negative for leg swelling.  Gastrointestinal: Positive for nausea. Negative for abdominal pain and vomiting.  Genitourinary: Negative for dysuria and flank pain.  Musculoskeletal: Negative for back pain, neck pain and neck  stiffness.  Skin: Negative for rash.  Neurological: Negative for light-headedness and headaches.    Physical Exam Updated Vital Signs BP (!) 141/95   Pulse 79   Temp 98.4 F (36.9 C) (Oral)   Resp 18   Ht 5\' 7"  (1.702 m)   Wt 78 kg   SpO2 98%   BMI 26.94 kg/m   Physical Exam Vitals and nursing note reviewed.  Constitutional:      Appearance: She is well-developed.  HENT:     Head: Normocephalic and atraumatic.  Eyes:     General:        Right eye: No discharge.        Left eye: No discharge.     Conjunctiva/sclera: Conjunctivae normal.  Neck:     Trachea: No tracheal deviation.  Cardiovascular:     Rate and Rhythm: Normal rate and regular rhythm.  Pulmonary:     Effort: Pulmonary effort is normal.     Breath sounds: Normal breath sounds.  Abdominal:     General: There is no distension.     Palpations: Abdomen is soft.     Tenderness: There is no abdominal tenderness. There is no guarding.  Musculoskeletal:     Cervical back: Normal range of motion and neck supple.     Right lower leg: No edema.     Left lower leg: No edema.  Skin:    General: Skin is warm.     Findings: No rash.  Neurological:     Mental Status: She is alert and oriented to person, place, and time.     ED Results / Procedures / Treatments   Labs (all labs ordered are listed, but only abnormal results are displayed) Labs Reviewed  BASIC METABOLIC PANEL - Abnormal; Notable for the following components:      Result Value   CO2 16 (*)    BUN 50 (*)    Creatinine, Ser 3.72 (*)    GFR calc non Af Amer 12 (*)    GFR calc Af Amer 13 (*)    All other components within normal limits  CBC  TROPONIN I (HIGH SENSITIVITY)  TROPONIN I (HIGH SENSITIVITY)    EKG EKG Interpretation  Date/Time:  Saturday July 13 2019 16:32:37 EDT Ventricular Rate:  78 PR Interval:  116 QRS Duration: 106 QT Interval:  398 QTC Calculation: 453 R Axis:   15 Text Interpretation: Normal sinus rhythm Incomplete  right bundle branch block Left ventricular hypertrophy with repolarization abnormality ( R in aVL , Cornell product ) overall similar to previous Confirmed by Elnora Morrison [phone]) on  07/13/2019 6:41:49 PM   Radiology DG Chest 2 View  Result Date: 07/13/2019 CLINICAL DATA:  Chest pressure and shortness of breath EXAM: CHEST - 2 VIEW COMPARISON:  07/04/2018 FINDINGS: The heart size and mediastinal contours are within normal limits. Both lungs are clear. The visualized skeletal structures are unremarkable. IMPRESSION: No active cardiopulmonary disease. Electronically Signed   By: Randa Ngo M.D.   On: 07/13/2019 17:51    Procedures Procedures (including critical care time)  Medications Ordered in ED Medications  sodium chloride flush (NS) 0.9 % injection 3 mL (has no administration in time range)    ED Course  I have reviewed the triage vital signs and the nursing notes.  Pertinent labs & imaging results that were available during my care of the patient were reviewed by me and considered in my medical decision making (see chart for details).    MDM Rules/Calculators/A&P                      Patient presents after episode of chest pain that lasted 45 minutes. With patient's age, high blood pressure and cholesterol concern this may be ACS. Discussed other differentials as well. Initial troponin negative, basic blood work reviewed normal hemoglobin, normal white blood cell count, kidney function is worsened compared to the last blood draw and discussed this in detail with the patient. Patient has follow-up with nephrology. EKG reviewed overall similar to previous no acute ischemic findings. Chest x-ray no acute abnormalities reviewed. Patient feels well in the ER, no signs or symptoms at this time. I discussed the importance of follow-up with cardiology on Monday for stress test and further treatment. Instructed patient to continue take her aspirin.  Delta troponin pending. Troponin  negative. Patient to follow-up closely outpatient with nephrology and cardiology. Final Clinical Impression(s) / ED Diagnoses Final diagnoses:  Acute chest pain  Acute kidney injury [facility])    Rx / DC Orders ED Discharge Orders    None       Elnora Morrison, MD 07/13/19 2120

## 2019-07-17 ENCOUNTER — Encounter: Payer: Self-pay | Admitting: *Deleted

## 2019-07-17 ENCOUNTER — Other Ambulatory Visit: Payer: Self-pay

## 2019-07-17 ENCOUNTER — Ambulatory Visit (INDEPENDENT_AMBULATORY_CARE_PROVIDER_SITE_OTHER): Payer: Medicare Other | Admitting: Cardiology

## 2019-07-17 VITALS — BP 122/82 | HR 79 | Ht 67.0 in | Wt 181.0 lb

## 2019-07-17 DIAGNOSIS — E785 Hyperlipidemia, unspecified: Secondary | ICD-10-CM | POA: Diagnosis not present

## 2019-07-17 DIAGNOSIS — I10 Essential (primary) hypertension: Secondary | ICD-10-CM | POA: Diagnosis not present

## 2019-07-17 DIAGNOSIS — R079 Chest pain, unspecified: Secondary | ICD-10-CM | POA: Diagnosis not present

## 2019-07-17 DIAGNOSIS — N184 Chronic kidney disease, stage 4 (severe): Secondary | ICD-10-CM | POA: Diagnosis not present

## 2019-07-17 NOTE — Progress Notes (Signed)
Cardiology Office Note:    Date:  07/17/2019   ID:  EDAN SERRATORE, DOB [birth year], MRN [id number]  PCP:  Seward Carol, MD  Cardiologist:  No primary care provider on file.  Electrophysiologist:  None   Referring MD: Seward Carol, MD   Chief Complaint  Patient presents with  . Chest Pain    History of Present Illness:    Karen Myers is a 74 y.o. female with a hx of breast cancer, CKD stage IV-V, hypertension, hyperlipidemia who is referred by Dr. Delfina Redwood for evaluation of chest pain.  She had a recent ED visit for chest pain on 07/13/2019.  Reports that she was driving today when she had the onset of pressure in the center of her chest.  10 out of 10 in intensity.  Lasted 45 minutes.  She has not had any chest pain like this before or since.  Presented to the ED.  High-sensitivity troponin was 8 then 6.  CXR unremarkable. Creatinine was up to 3.72.    She reports that she does not exercise, most exertion she does is walking up and down stairs.  She denies any exertional chest pain or dyspnea.  Denies any lightheadedness, syncope, palpitations, or lower extremity edema.  She quit smoking in 2002.  Family history includes maternal grandmother died of MI in 37s.    Past Medical History:  Diagnosis Date  . Arthritis   . Breast cancer ([place])    left breast cancer   . Breast cancer ([place])   . Chronic kidney disease   . CIN III (cervical intraepithelial neoplasia III)    prior to LEEP procedure  . GERD (gastroesophageal reflux disease)   . High cholesterol   . Hypertension     Past Surgical History:  Procedure Laterality Date  . BREAST LUMPECTOMY    . CERVICAL BIOPSY  W/ LOOP ELECTRODE EXCISION  2005  . COLONOSCOPY    . HYSTEROSCOPY WITH D & C  2005  . MASTECTOMY  2002   bilateral   . PARATHYROIDECTOMY N/A 06/01/2017   Procedure: PARATHYROIDECTOMY;  Surgeon: Armandina Gemma, MD;  Location: [facility];  Service: General;  Laterality: N/A;  . PARATHYROIDECTOMY N/A  12/07/2018   Procedure: NECK EXPLORATION, RIGHT INFERIOR PARATHYROIDECTOMY, LEFT THYROID LOBECTOMY;  Surgeon: Armandina Gemma, MD;  Location: [facility];  Service: General;  Laterality: N/A;  . POLYPECTOMY    . RENAL BIOPSY    . VULVA /PERINEUM BIOPSY     nevus on vulva     Current Medications: Current Meds  Medication Sig  . acetaminophen (TYLENOL) 500 MG tablet Take 1,000 mg by mouth 2 (two) times daily.  Marland Kitchen aspirin EC 81 MG tablet Take 81 mg by mouth daily.  Marland Kitchen atorvastatin (LIPITOR) 40 MG tablet Take 40 mg by mouth daily.   Marland Kitchen diltiazem (CARDIZEM CD) 240 MG 24 hr capsule Take 240 mg by mouth daily.  Marland Kitchen losartan (COZAAR) 100 MG tablet Take 100 mg by mouth daily.  . traMADol (ULTRAM) 50 MG tablet Take 1-2 tablets (50-100 mg total) by mouth every 6 (six) hours as needed.     Allergies:   Altace [ramipril]   Social History   Socioeconomic History  . Marital status: Married    Spouse name: Not on file  . Number of children: Not on file  . Years of education: Not on file  . Highest education level: Not on file  Occupational History  . Not on file  Tobacco Use  . Smoking status:  Former Smoker  . Smokeless tobacco: Never Used  Substance and Sexual Activity  . Alcohol use: No  . Drug use: No  . Sexual activity: Not on file  Other Topics Concern  . Not on file  Social History Narrative  . Not on file   Social Determinants of Health   Financial Resource Strain:   . Difficulty of Paying Living Expenses:   Food Insecurity:   . Worried About Charity fundraiser in the Last Year:   . Arboriculturist in the Last Year:   Transportation Needs:   . Film/video editor (Medical):   Marland Kitchen Lack of Transportation (Non-Medical):   Physical Activity:   . Days of Exercise per Week:   . Minutes of Exercise per Session:   Stress:   . Feeling of Stress :   Social Connections:   . Frequency of Communication with Friends and Family:   . Frequency of Social Gatherings with Friends and Family:    . Attends Religious Services:   . Active Member of Clubs or Organizations:   . Attends Archivist Meetings:   Marland Kitchen Marital Status:      Family History: Maternal grandmother died of MI in 61s.  ROS:   Please see the history of present illness.     All other systems reviewed and are negative.  EKGs/Labs/Other Studies Reviewed:    The following studies were reviewed today:   EKG:  EKG is ordered today.  The ekg ordered today demonstrates normal sinus rhythm, rate 79, LVH, no ST changes, Q waves in V1/2  Recent Labs: 07/13/2019: BUN 50; Creatinine, Ser 3.72; Hemoglobin 12.1; Platelets 221; Potassium 4.6; Sodium 140  Recent Lipid Panel    Component Value Date/Time   CHOL 196 11/04/2010 0817   TRIG 169 (H) 11/04/2010 0817   HDL 43 11/04/2010 0817   CHOLHDL 4.6 11/04/2010 0817   VLDL 34 11/04/2010 0817   LDLCALC 119 (H) 11/04/2010 0817    Physical Exam:    VS:  BP 122/82   Pulse 79   Ht 5\' 7"  (1.702 m)   Wt 181 lb (82.1 kg)   SpO2 96%   BMI 28.35 kg/m     Wt Readings from Last 3 Encounters:  07/17/19 181 lb (82.1 kg)  07/13/19 172 lb (78 kg)  12/07/18 170 lb 3 oz (77.2 kg)     GEN: Well nourished, well developed in no acute distress HEENT: Normal NECK: No JVD; No carotid bruits CARDIAC: RRR, no murmurs, rubs, gallops RESPIRATORY:  Clear to auscultation without rales, wheezing or rhonchi  ABDOMEN: Soft, non-tender, non-distended MUSCULOSKELETAL:  No edema SKIN: Warm and dry NEUROLOGIC:  Alert and oriented x 3 PSYCHIATRIC:  Normal affect   ASSESSMENT:    1. Chest pain of uncertain etiology   2. Essential hypertension   3. Hyperlipidemia, unspecified hyperlipidemia type   4. Chronic kidney disease (CKD), stage IV (severe) (HCC)    PLAN:    Chest pain: Atypical in description but does have significant CAD risk factors (hypertension, hyperlipidemia, age, former tobacco use).  Not a coronary CTA candidate given her CKD. -Lexiscan  Myoview -Echocardiogram  CKD stage IV-V: Follows with nephrology.  Hypertension: On losartan 100 mg daily and diltiazem 240 mg daily.  Appears controlled  Hyperlipidemia: On atorvastatin 40 mg daily.  RTC in 1 month   Medication Adjustments/Labs and Tests Ordered: Current medicines are reviewed at length with the patient today.  Concerns regarding medicines are outlined above.  Orders Placed This Encounter  Procedures  . MYOCARDIAL PERFUSION IMAGING  . EKG 12-Lead  . ECHOCARDIOGRAM COMPLETE   No orders of the defined types were placed in this encounter.   Patient Instructions  Medication Instructions:  Your physician recommends that you continue on your current medications as directed. Please refer to the Current Medication list given to you today.  *If you need a refill on your cardiac medications before your next appointment, please call your pharmacy*  Testing/Procedures: Your physician has requested that you have an echocardiogram. Echocardiography is a painless test that uses sound waves to create images of your heart. It provides your doctor with information about the size and shape of your heart and how well your heart's chambers and valves are working. This procedure takes approximately one hour. There are no restrictions for this procedure. This will be done at our [facility] location:  [place] has requested that you have a lexiscan myoview. For further information please visit [email or web address]. Please follow instruction sheet, as given.  Follow-Up: At [facility], you and your health needs are our priority.  As part of our continuing mission to provide you with exceptional heart care, we have created designated Provider Care Teams.  These Care Teams include your primary Cardiologist (physician) and Advanced Practice Providers (APPs -  Physician Assistants and Nurse Practitioners) who all work together to provide you with  the care you need, when you need it.  We recommend signing up for the patient portal called "MyChart".  Sign up information is provided on this After Visit Summary.  MyChart is used to connect with patients for Virtual Visits (Telemedicine).  Patients are able to view lab/test results, encounter notes, upcoming appointments, etc.  Non-urgent messages can be sent to your provider as well.   To learn more about what you can do with MyChart, go to [email or web address].    Your next appointment:   1 month(s)  The format for your next appointment:   In Person  Provider:   Oswaldo Milian, MD       Signed, Donato Heinz, MD  07/17/2019 1:02 PM    [place]

## 2019-07-17 NOTE — Patient Instructions (Signed)
Medication Instructions:  Your physician recommends that you continue on your current medications as directed. Please refer to the Current Medication list given to you today.  *If you need a refill on your cardiac medications before your next appointment, please call your pharmacy*  Testing/Procedures: Your physician has requested that you have an echocardiogram. Echocardiography is a painless test that uses sound waves to create images of your heart. It provides your doctor with information about the size and shape of your heart and how well your heart's chambers and valves are working. This procedure takes approximately one hour. There are no restrictions for this procedure. This will be done at our [facility] location:  [place] has requested that you have a lexiscan myoview. For further information please visit [email or web address]. Please follow instruction sheet, as given.  Follow-Up: At [facility], you and your health needs are our priority.  As part of our continuing mission to provide you with exceptional heart care, we have created designated Provider Care Teams.  These Care Teams include your primary Cardiologist (physician) and Advanced Practice Providers (APPs -  Physician Assistants and Nurse Practitioners) who all work together to provide you with the care you need, when you need it.  We recommend signing up for the patient portal called "MyChart".  Sign up information is provided on this After Visit Summary.  MyChart is used to connect with patients for Virtual Visits (Telemedicine).  Patients are able to view lab/test results, encounter notes, upcoming appointments, etc.  Non-urgent messages can be sent to your provider as well.   To learn more about what you can do with MyChart, go to [email or web address].    Your next appointment:   1 month(s)  The format for your next appointment:   In Person  Provider:   Oswaldo Milian,  MD

## 2019-08-05 ENCOUNTER — Telehealth (HOSPITAL_COMMUNITY): Payer: Self-pay

## 2019-08-05 NOTE — Telephone Encounter (Signed)
Detailed instructions left on the patient's answering machine. Asked to call back with and questions. S.Dalvin Clipper EMTP

## 2019-08-08 ENCOUNTER — Other Ambulatory Visit: Payer: Self-pay

## 2019-08-08 ENCOUNTER — Ambulatory Visit (HOSPITAL_BASED_OUTPATIENT_CLINIC_OR_DEPARTMENT_OTHER): Payer: Medicare Other

## 2019-08-08 ENCOUNTER — Ambulatory Visit (HOSPITAL_COMMUNITY): Payer: Medicare Other | Attending: Cardiology

## 2019-08-08 DIAGNOSIS — R079 Chest pain, unspecified: Secondary | ICD-10-CM | POA: Diagnosis not present

## 2019-08-08 LAB — MYOCARDIAL PERFUSION IMAGING
LV dias vol: 53 mL (ref 46–106)
LV sys vol: 22 mL
Peak HR: 86 {beats}/min
Rest HR: 73 {beats}/min
SDS: 8
SRS: 1
SSS: 9
TID: 0.9

## 2019-08-08 MED ORDER — REGADENOSON 0.4 MG/5ML IV SOLN
0.4000 mg | Freq: Once | INTRAVENOUS | Status: AC
Start: 2019-08-08 — End: 2019-08-08
  Administered 2019-08-08: 0.4 mg via INTRAVENOUS

## 2019-08-08 MED ORDER — TECHNETIUM TC 99M TETROFOSMIN IV KIT
32.8000 | PACK | Freq: Once | INTRAVENOUS | Status: AC | PRN
  Administered 2019-08-08: 32.8 via INTRAVENOUS
  Filled 2019-08-08: qty 33

## 2019-08-08 MED ORDER — TECHNETIUM TC 99M TETROFOSMIN IV KIT
10.2000 | PACK | Freq: Once | INTRAVENOUS | Status: AC | PRN
  Administered 2019-08-08: 10.2 via INTRAVENOUS
  Filled 2019-08-08: qty 11

## 2019-08-08 MED ORDER — PERFLUTREN LIPID MICROSPHERE
1.0000 mL | INTRAVENOUS | Status: AC | PRN
  Administered 2019-08-08: 3 mL via INTRAVENOUS

## 2019-08-20 ENCOUNTER — Ambulatory Visit (INDEPENDENT_AMBULATORY_CARE_PROVIDER_SITE_OTHER): Payer: Medicare Other | Admitting: Cardiology

## 2019-08-20 ENCOUNTER — Other Ambulatory Visit: Payer: Self-pay

## 2019-08-20 ENCOUNTER — Encounter: Payer: Self-pay | Admitting: Cardiology

## 2019-08-20 VITALS — BP 149/89 | HR 75 | Ht 67.0 in | Wt 181.8 lb

## 2019-08-20 DIAGNOSIS — N184 Chronic kidney disease, stage 4 (severe): Secondary | ICD-10-CM | POA: Diagnosis not present

## 2019-08-20 DIAGNOSIS — I10 Essential (primary) hypertension: Secondary | ICD-10-CM | POA: Diagnosis not present

## 2019-08-20 DIAGNOSIS — R079 Chest pain, unspecified: Secondary | ICD-10-CM

## 2019-08-20 DIAGNOSIS — E785 Hyperlipidemia, unspecified: Secondary | ICD-10-CM | POA: Diagnosis not present

## 2019-08-20 NOTE — Progress Notes (Signed)
Cardiology Office Note:    Date:  08/20/2019   ID:  Karen Myers, DOB October 17, 1945, MRN [id number]  PCP:  Karen Carol, MD  Cardiologist:  No primary care provider on file.  Electrophysiologist:  None   Referring MD: Karen Carol, MD   Chief complaint: Chest pain  History of Present Illness:    Karen Myers is a 74 y.o. female with a hx of breast cancer, CKD stage IV-V, hypertension, hyperparathyroidism, hyperlipidemia who presents for follow-up.  She was referred by Dr. Delfina Myers for evaluation of chest pain, initially seen on 07/17/2019.  She had a recent ED visit for chest pain on 07/13/2019.  Reports that she was driving today when she had the onset of pressure in the center of her chest.  10 out of 10 in intensity.  Lasted 45 minutes.  She has not had any chest pain like this before or since.  Presented to the ED.  High-sensitivity troponin was 8 then 6.  CXR unremarkable. Creatinine was up to 3.72.    She reports that she does not exercise, most exertion she does is walking up and down stairs.  She denies any exertional chest pain or dyspnea.  Denies any lightheadedness, syncope, palpitations, or lower extremity edema.  She quit smoking in 2002.  Family history includes maternal grandmother died of MI in 67s.  Lexiscan Myoview on 08/08/2019 showed normal perfusion and EF 59%.  Echocardiogram on 08/08/2019 showed normal LV function, mildly reduced RV function, grade 1 diastolic dysfunction, no significant valvular disease.  Since last clinic visit, she reports that she has been doing well.  She had an episode of chest pain last week.  Reported mild pressure in center of her chest.  Also had episode of vomiting.  Lasted less than 1 minute.  She has not been exercising regularly but walks up and down stairs in her home.  She denies any chest pain with this.     Past Medical History:  Diagnosis Date  . Arthritis   . Breast cancer ([place])    left breast cancer   . Breast cancer  ([place])   . Chronic kidney disease   . CIN III (cervical intraepithelial neoplasia III)    prior to LEEP procedure  . GERD (gastroesophageal reflux disease)   . High cholesterol   . Hypertension     Past Surgical History:  Procedure Laterality Date  . BREAST LUMPECTOMY    . CERVICAL BIOPSY  W/ LOOP ELECTRODE EXCISION  2005  . COLONOSCOPY    . HYSTEROSCOPY WITH D & C  2005  . MASTECTOMY  2002   bilateral   . PARATHYROIDECTOMY N/A 06/01/2017   Procedure: PARATHYROIDECTOMY;  Surgeon: Armandina Gemma, MD;  Location: [facility];  Service: General;  Laterality: N/A;  . PARATHYROIDECTOMY N/A 12/07/2018   Procedure: NECK EXPLORATION, RIGHT INFERIOR PARATHYROIDECTOMY, LEFT THYROID LOBECTOMY;  Surgeon: Armandina Gemma, MD;  Location: [facility];  Service: General;  Laterality: N/A;  . POLYPECTOMY    . RENAL BIOPSY    . VULVA /PERINEUM BIOPSY     nevus on vulva     Current Medications: Current Meds  Medication Sig  . acetaminophen (TYLENOL) 500 MG tablet Take 1,000 mg by mouth 2 (two) times daily.  Marland Kitchen aspirin EC 81 MG tablet Take 81 mg by mouth daily.  Marland Kitchen atorvastatin (LIPITOR) 40 MG tablet Take 40 mg by mouth daily.   Marland Kitchen diltiazem (CARDIZEM CD) 240 MG 24 hr capsule Take 240 mg by mouth daily.  Marland Kitchen  losartan (COZAAR) 100 MG tablet Take 100 mg by mouth daily.     Allergies:   Altace [ramipril]   Social History   Socioeconomic History  . Marital status: Married    Spouse name: Not on file  . Number of children: Not on file  . Years of education: Not on file  . Highest education level: Not on file  Occupational History  . Not on file  Tobacco Use  . Smoking status: Former Research scientist (life sciences)  . Smokeless tobacco: Never Used  Vaping Use  . Vaping Use: Never used  Substance and Sexual Activity  . Alcohol use: No  . Drug use: No  . Sexual activity: Not on file  Other Topics Concern  . Not on file  Social History Narrative  . Not on file   Social Determinants of Health   Financial Resource Strain:   .  Difficulty of Paying Living Expenses:   Food Insecurity:   . Worried About Charity fundraiser in the Last Year:   . Arboriculturist in the Last Year:   Transportation Needs:   . Film/video editor (Medical):   Marland Kitchen Lack of Transportation (Non-Medical):   Physical Activity:   . Days of Exercise per Week:   . Minutes of Exercise per Session:   Stress:   . Feeling of Stress :   Social Connections:   . Frequency of Communication with Friends and Family:   . Frequency of Social Gatherings with Friends and Family:   . Attends Religious Services:   . Active Member of Clubs or Organizations:   . Attends Archivist Meetings:   Marland Kitchen Marital Status:      Family History: Maternal grandmother died of MI in 80s.  ROS:   Please see the history of present illness.     All other systems reviewed and are negative.  EKGs/Labs/Other Studies Reviewed:    The following studies were reviewed today:   EKG:  EKG is not ordered today.  The ekg ordered at prior clinic visit demonstrates normal sinus rhythm, rate 79, LVH, no ST changes, Q waves in V1/2  Recent Labs: 07/13/2019: BUN 50; Creatinine, Ser 3.72; Hemoglobin 12.1; Platelets 221; Potassium 4.6; Sodium 140  Recent Lipid Panel    Component Value Date/Time   CHOL 196 11/04/2010 0817   TRIG 169 (H) 11/04/2010 0817   HDL 43 11/04/2010 0817   CHOLHDL 4.6 11/04/2010 0817   VLDL 34 11/04/2010 0817   LDLCALC 119 (H) 11/04/2010 0817    Physical Exam:    VS:  BP (!) 149/89   Pulse 75   Ht 5\' 7"  (1.702 m)   Wt 181 lb 12.8 oz (82.5 kg)   SpO2 99%   BMI 28.47 kg/m     Wt Readings from Last 3 Encounters:  08/20/19 181 lb 12.8 oz (82.5 kg)  08/08/19 181 lb (82.1 kg)  07/17/19 181 lb (82.1 kg)     GEN: Well nourished, well developed in no acute distress HEENT: Normal NECK: No JVD; No carotid bruits CARDIAC: RRR, no murmurs, rubs, gallops RESPIRATORY:  Clear to auscultation without rales, wheezing or rhonchi  ABDOMEN: Soft,  non-tender, non-distended MUSCULOSKELETAL:  No edema SKIN: Warm and dry NEUROLOGIC:  Alert and oriented x 3 PSYCHIATRIC:  Normal affect   ASSESSMENT:    1. Chest pain of uncertain etiology   2. Chronic kidney disease (CKD), stage IV (severe) ([place])   3. Essential hypertension   4. Hyperlipidemia, unspecified hyperlipidemia type  PLAN:    Chest pain: Atypical in description but does have significant CAD risk factors (hypertension, hyperlipidemia, age, former tobacco use).  Lexiscan Myoview on 08/08/2019 showed normal perfusion and EF 59%.  Echocardiogram on 08/08/2019 showed normal LV function, mildly reduced RV function, grade 1 diastolic dysfunction, no significant valvular disease.  No further cardiac work-up recommended.  CKD stage IV-V: Follows with nephrology.  Hypertension: On losartan 100 mg daily and diltiazem 240 mg daily.    Hyperlipidemia: On atorvastatin 40 mg daily.  LDL 119 on 04/01/2019.  Recommend calcium score for further risk stratification.  Patient states that she will think about it and get back to [place] if she would like to proceed with calcium score  RTC in 6 months   Medication Adjustments/Labs and Tests Ordered: Current medicines are reviewed at length with the patient today.  Concerns regarding medicines are outlined above.  No orders of the defined types were placed in this encounter.  No orders of the defined types were placed in this encounter.   Patient Instructions  Medication Instructions:  Your physician recommends that you continue on your current medications as directed. Please refer to the Current Medication list given to you today.  *If you need a refill on your cardiac medications before your next appointment, please call your pharmacy*  Testing/Procedures: Please call if your interesting in having this test completed:   Coronary CalciumScan A coronary calcium scan is an imaging test used to look for deposits of calcium and other fatty  materials (plaques) in the inner lining of the blood vessels of the heart (coronary arteries). These deposits of calcium and plaques can partly clog and narrow the coronary arteries without producing any symptoms or warning signs. This puts a person at risk for a heart attack. This test can detect these deposits before symptoms develop. Tell a health care provider about:  Any allergies you have.  All medicines you are taking, including vitamins, herbs, eye drops, creams, and over-the-counter medicines.  Any problems you or family members have had with anesthetic medicines.  Any blood disorders you have.  Any surgeries you have had.  Any medical conditions you have.  Whether you are pregnant or may be pregnant. What are the risks? Generally, this is a safe procedure. However, problems may occur, including:  Harm to a pregnant woman and her unborn baby. This test involves the use of radiation. Radiation exposure can be dangerous to a pregnant woman and her unborn baby. If you are pregnant, you generally should not have this procedure done.  Slight increase in the risk of cancer. This is because of the radiation involved in the test. What happens before the procedure? No preparation is needed for this procedure. What happens during the procedure?  You will undress and remove any jewelry around your neck or chest.  You will put on a hospital gown.  Sticky electrodes will be placed on your chest. The electrodes will be connected to an electrocardiogram (ECG) machine to record a tracing of the electrical activity of your heart.  A CT scanner will take pictures of your heart. During this time, you will be asked to lie still and hold your breath for 2-3 seconds while a picture of your heart is being taken. The procedure may vary among health care providers and hospitals. What happens after the procedure?  You can get dressed.  You can return to your normal activities.  It is up to you  to get the results of your  test. Ask your health care provider, or the department that is doing the test, when your results will be ready. Summary  A coronary calcium scan is an imaging test used to look for deposits of calcium and other fatty materials (plaques) in the inner lining of the blood vessels of the heart (coronary arteries).  Generally, this is a safe procedure. Tell your health care provider if you are pregnant or may be pregnant.  No preparation is needed for this procedure.  A CT scanner will take pictures of your heart.  You can return to your normal activities after the scan is done. This information is not intended to replace advice given to you by your health care provider. Make sure you discuss any questions you have with your health care provider. Document Released: 07/23/2007 Document Revised: 12/14/2015 Document Reviewed: 12/14/2015 Elsevier Interactive Patient Education  2017 [place]: At [facility], you and your health needs are our priority.  As part of our continuing mission to provide you with exceptional heart care, we have created designated Provider Care Teams.  These Care Teams include your primary Cardiologist (physician) and Advanced Practice Providers (APPs -  Physician Assistants and Nurse Practitioners) who all work together to provide you with the care you need, when you need it.  We recommend signing up for the patient portal called "MyChart".  Sign up information is provided on this After Visit Summary.  MyChart is used to connect with patients for Virtual Visits (Telemedicine).  Patients are able to view lab/test results, encounter notes, upcoming appointments, etc.  Non-urgent messages can be sent to your provider as well.   To learn more about what you can do with MyChart, go to [email or web address].    Your next appointment:   6 month(s)  The format for your next appointment:   In Person  Provider:   Oswaldo Milian, MD         Signed, Donato Heinz, MD  08/20/2019 9:35 AM    [place]

## 2019-08-20 NOTE — Patient Instructions (Signed)
Medication Instructions:  Your physician recommends that you continue on your current medications as directed. Please refer to the Current Medication list given to you today.  *If you need a refill on your cardiac medications before your next appointment, please call your pharmacy*  Testing/Procedures: Please call if your interesting in having this test completed:   Coronary CalciumScan A coronary calcium scan is an imaging test used to look for deposits of calcium and other fatty materials (plaques) in the inner lining of the blood vessels of the heart (coronary arteries). These deposits of calcium and plaques can partly clog and narrow the coronary arteries without producing any symptoms or warning signs. This puts a person at risk for a heart attack. This test can detect these deposits before symptoms develop. Tell a health care provider about:  Any allergies you have.  All medicines you are taking, including vitamins, herbs, eye drops, creams, and over-the-counter medicines.  Any problems you or family members have had with anesthetic medicines.  Any blood disorders you have.  Any surgeries you have had.  Any medical conditions you have.  Whether you are pregnant or may be pregnant. What are the risks? Generally, this is a safe procedure. However, problems may occur, including:  Harm to a pregnant woman and her unborn baby. This test involves the use of radiation. Radiation exposure can be dangerous to a pregnant woman and her unborn baby. If you are pregnant, you generally should not have this procedure done.  Slight increase in the risk of cancer. This is because of the radiation involved in the test. What happens before the procedure? No preparation is needed for this procedure. What happens during the procedure?  You will undress and remove any jewelry around your neck or chest.  You will put on a hospital gown.  Sticky electrodes will be placed on your chest. The  electrodes will be connected to an electrocardiogram (ECG) machine to record a tracing of the electrical activity of your heart.  A CT scanner will take pictures of your heart. During this time, you will be asked to lie still and hold your breath for 2-3 seconds while a picture of your heart is being taken. The procedure may vary among health care providers and hospitals. What happens after the procedure?  You can get dressed.  You can return to your normal activities.  It is up to you to get the results of your test. Ask your health care provider, or the department that is doing the test, when your results will be ready. Summary  A coronary calcium scan is an imaging test used to look for deposits of calcium and other fatty materials (plaques) in the inner lining of the blood vessels of the heart (coronary arteries).  Generally, this is a safe procedure. Tell your health care provider if you are pregnant or may be pregnant.  No preparation is needed for this procedure.  A CT scanner will take pictures of your heart.  You can return to your normal activities after the scan is done. This information is not intended to replace advice given to you by your health care provider. Make sure you discuss any questions you have with your health care provider. Document Released: 07/23/2007 Document Revised: 12/14/2015 Document Reviewed: 12/14/2015 Elsevier Interactive Patient Education  2017 [place]: At [facility], you and your health needs are our priority.  As part of our continuing mission to provide you with exceptional heart care, we have  created designated Provider Care Teams.  These Care Teams include your primary Cardiologist (physician) and Advanced Practice Providers (APPs -  Physician Assistants and Nurse Practitioners) who all work together to provide you with the care you need, when you need it.  We recommend signing up for the patient portal called "MyChart".   Sign up information is provided on this After Visit Summary.  MyChart is used to connect with patients for Virtual Visits (Telemedicine).  Patients are able to view lab/test results, encounter notes, upcoming appointments, etc.  Non-urgent messages can be sent to your provider as well.   To learn more about what you can do with MyChart, go to [email or web address].    Your next appointment:   6 month(s)  The format for your next appointment:   In Person  Provider:   Oswaldo Milian, MD

## 2020-02-16 NOTE — Progress Notes (Deleted)
Cardiology Office Note:    Date:  02/16/2020   ID:  Karen Myers, DOB 01-30-1946, MRN [id number]  PCP:  Seward Carol, MD  Cardiologist:  No primary care provider on file.  Electrophysiologist:  None   Referring MD: Seward Carol, MD   Chief complaint: Chest pain  History of Present Illness:    Karen Myers is a 75 y.o. female with a hx of breast cancer, CKD stage IV-V, hypertension, hyperparathyroidism, hyperlipidemia who presents for follow-up.  She was referred by Dr. Delfina Redwood for evaluation of chest pain, initially seen on 07/17/2019.  She had a recent ED visit for chest pain on 07/13/2019.  Reports that she was driving today when she had the onset of pressure in the center of her chest.  10 out of 10 in intensity.  Lasted 45 minutes.  She has not had any chest pain like this before or since.  Presented to the ED.  High-sensitivity troponin was 8 then 6.  CXR unremarkable. Creatinine was up to 3.72.    She reports that she does not exercise, most exertion she does is walking up and down stairs.  She denies any exertional chest pain or dyspnea.  Denies any lightheadedness, syncope, palpitations, or lower extremity edema.  She quit smoking in 2002.  Family history includes maternal grandmother died of MI in 65s.  Lexiscan Myoview on 08/08/2019 showed normal perfusion and EF 59%.  Echocardiogram on 08/08/2019 showed normal LV function, mildly reduced RV function, grade 1 diastolic dysfunction, no significant valvular disease.  Since last clinic visit,   she reports that she has been doing well.  She had an episode of chest pain last week.  Reported mild pressure in center of her chest.  Also had episode of vomiting.  Lasted less than 1 minute.  She has not been exercising regularly but walks up and down stairs in her home.  She denies any chest pain with this.     Past Medical History:  Diagnosis Date  . Arthritis   . Breast cancer ([place])    left breast cancer   . Breast  cancer ([place])   . Chronic kidney disease   . CIN III (cervical intraepithelial neoplasia III)    prior to LEEP procedure  . GERD (gastroesophageal reflux disease)   . High cholesterol   . Hypertension     Past Surgical History:  Procedure Laterality Date  . BREAST LUMPECTOMY    . CERVICAL BIOPSY  W/ LOOP ELECTRODE EXCISION  2005  . COLONOSCOPY    . HYSTEROSCOPY WITH D & C  2005  . MASTECTOMY  2002   bilateral   . PARATHYROIDECTOMY N/A 06/01/2017   Procedure: PARATHYROIDECTOMY;  Surgeon: Armandina Gemma, MD;  Location: [facility];  Service: General;  Laterality: N/A;  . PARATHYROIDECTOMY N/A 12/07/2018   Procedure: NECK EXPLORATION, RIGHT INFERIOR PARATHYROIDECTOMY, LEFT THYROID LOBECTOMY;  Surgeon: Armandina Gemma, MD;  Location: [facility];  Service: General;  Laterality: N/A;  . POLYPECTOMY    . RENAL BIOPSY    . VULVA /PERINEUM BIOPSY     nevus on vulva     Current Medications: No outpatient medications have been marked as taking for the 02/18/20 encounter (Appointment) with Donato Heinz, MD.     Allergies:   Altace [ramipril]   Social History   Socioeconomic History  . Marital status: Married    Spouse name: Not on file  . Number of children: Not on file  . Years of education: Not on  file  . Highest education level: Not on file  Occupational History  . Not on file  Tobacco Use  . Smoking status: Former Research scientist (life sciences)  . Smokeless tobacco: Never Used  Vaping Use  . Vaping Use: Never used  Substance and Sexual Activity  . Alcohol use: No  . Drug use: No  . Sexual activity: Not on file  Other Topics Concern  . Not on file  Social History Narrative  . Not on file   Social Determinants of Health   Financial Resource Strain: Not on file  Food Insecurity: Not on file  Transportation Needs: Not on file  Physical Activity: Not on file  Stress: Not on file  Social Connections: Not on file     Family History: Maternal grandmother died of MI in 48s.  ROS:   Please see  the history of present illness.     All other systems reviewed and are negative.  EKGs/Labs/Other Studies Reviewed:    The following studies were reviewed today:   EKG:  EKG is not ordered today.  The ekg ordered at prior clinic visit demonstrates normal sinus rhythm, rate 79, LVH, no ST changes, Q waves in V1/2  Recent Labs: 07/13/2019: BUN 50; Creatinine, Ser 3.72; Hemoglobin 12.1; Platelets 221; Potassium 4.6; Sodium 140  Recent Lipid Panel    Component Value Date/Time   CHOL 196 11/04/2010 0817   TRIG 169 (H) 11/04/2010 0817   HDL 43 11/04/2010 0817   CHOLHDL 4.6 11/04/2010 0817   VLDL 34 11/04/2010 0817   LDLCALC 119 (H) 11/04/2010 0817    Physical Exam:    VS:  There were no vitals taken for this visit.    Wt Readings from Last 3 Encounters:  08/20/19 181 lb 12.8 oz (82.5 kg)  08/08/19 181 lb (82.1 kg)  07/17/19 181 lb (82.1 kg)     GEN: Well nourished, well developed in no acute distress HEENT: Normal NECK: No JVD; No carotid bruits CARDIAC: RRR, no murmurs, rubs, gallops RESPIRATORY:  Clear to auscultation without rales, wheezing or rhonchi  ABDOMEN: Soft, non-tender, non-distended MUSCULOSKELETAL:  No edema SKIN: Warm and dry NEUROLOGIC:  Alert and oriented x 3 PSYCHIATRIC:  Normal affect   ASSESSMENT:    No diagnosis found. PLAN:    Chest pain: Atypical in description but does have significant CAD risk factors (hypertension, hyperlipidemia, age, former tobacco use).  Lexiscan Myoview on 08/08/2019 showed normal perfusion and EF 59%.  Echocardiogram on 08/08/2019 showed normal LV function, mildly reduced RV function, grade 1 diastolic dysfunction, no significant valvular disease.  No further cardiac work-up recommended.  CKD stage IV-V: Follows with nephrology.  Hypertension: On losartan 100 mg daily and diltiazem 240 mg daily.    Hyperlipidemia: On atorvastatin 40 mg daily.  LDL 119 on 04/01/2019.  Recommend calcium score for further risk stratification.   Patient states that she will think about it and get back to [place] if she would like to proceed with calcium score  RTC in 6 months   Medication Adjustments/Labs and Tests Ordered: Current medicines are reviewed at length with the patient today.  Concerns regarding medicines are outlined above.  No orders of the defined types were placed in this encounter.  No orders of the defined types were placed in this encounter.   There are no Patient Instructions on file for this visit.   Signed, Donato Heinz, MD  02/16/2020 3:03 PM    [facility]

## 2020-02-18 ENCOUNTER — Ambulatory Visit: Payer: Medicare Other | Admitting: Cardiology

## 2020-02-20 DIAGNOSIS — Z20822 Contact with and (suspected) exposure to covid-19: Secondary | ICD-10-CM | POA: Diagnosis not present

## 2020-02-21 DIAGNOSIS — E785 Hyperlipidemia, unspecified: Secondary | ICD-10-CM | POA: Diagnosis not present

## 2020-02-21 DIAGNOSIS — N183 Chronic kidney disease, stage 3 unspecified: Secondary | ICD-10-CM | POA: Diagnosis not present

## 2020-02-21 DIAGNOSIS — N042 Nephrotic syndrome with diffuse membranous glomerulonephritis: Secondary | ICD-10-CM | POA: Diagnosis not present

## 2020-02-21 DIAGNOSIS — M8580 Other specified disorders of bone density and structure, unspecified site: Secondary | ICD-10-CM | POA: Diagnosis not present

## 2020-02-21 DIAGNOSIS — E7800 Pure hypercholesterolemia, unspecified: Secondary | ICD-10-CM | POA: Diagnosis not present

## 2020-02-21 DIAGNOSIS — N184 Chronic kidney disease, stage 4 (severe): Secondary | ICD-10-CM | POA: Diagnosis not present

## 2020-02-21 DIAGNOSIS — C50919 Malignant neoplasm of unspecified site of unspecified female breast: Secondary | ICD-10-CM | POA: Diagnosis not present

## 2020-02-21 DIAGNOSIS — I10 Essential (primary) hypertension: Secondary | ICD-10-CM | POA: Diagnosis not present

## 2020-03-01 NOTE — Progress Notes (Signed)
Cardiology Office Note:    Date:  03/04/2020   ID:  Karen Myers, DOB 11/27/1945, MRN [id number]  PCP:  Seward Carol, MD  Cardiologist:  No primary care provider on file.  Electrophysiologist:  None   Referring MD: Seward Carol, MD   Chief complaint: Chest pain  History of Present Illness:    Karen Myers is a 75 y.o. female with a hx of breast cancer, CKD stage IV-V, hypertension, hyperparathyroidism, hyperlipidemia who presents for follow-up.  She was referred by Dr. Delfina Redwood for evaluation of chest pain, initially seen on 07/17/2019.  She had a recent ED visit for chest pain on 07/13/2019.  Reports that she was driving today when she had the onset of pressure in the center of her chest.  10 out of 10 in intensity.  Lasted 45 minutes.  She has not had any chest pain like this before or since.  Presented to the ED.  High-sensitivity troponin was 8 then 6.  CXR unremarkable. Creatinine was up to 3.72.    She reports that she does not exercise, most exertion she does is walking up and down stairs.  She denies any exertional chest pain or dyspnea.  Denies any lightheadedness, syncope, palpitations, or lower extremity edema.  She quit smoking in 2002.  Family history includes maternal grandmother died of MI in 34s.  Lexiscan Myoview on 08/08/2019 showed normal perfusion and EF 59%.  Echocardiogram on 08/08/2019 showed normal LV function, mildly reduced RV function, grade 1 diastolic dysfunction, no significant valvular disease.  Since last clinic visit, she reports that she has been doing well.  States that she has not had any chest pain for the last few months.  Denies any shortness of breath, lightheadedness, syncope, lower extremity edema, or palpitations.  Has not been exercising    Past Medical History:  Diagnosis Date  . Arthritis   . Breast cancer ([place])    left breast cancer   . Breast cancer ([place])   . Chronic kidney disease   . CIN III (cervical intraepithelial  neoplasia III)    prior to LEEP procedure  . GERD (gastroesophageal reflux disease)   . High cholesterol   . Hypertension     Past Surgical History:  Procedure Laterality Date  . BREAST LUMPECTOMY    . CERVICAL BIOPSY  W/ LOOP ELECTRODE EXCISION  2005  . COLONOSCOPY    . HYSTEROSCOPY WITH D & C  2005  . MASTECTOMY  2002   bilateral   . PARATHYROIDECTOMY N/A 06/01/2017   Procedure: PARATHYROIDECTOMY;  Surgeon: Armandina Gemma, MD;  Location: [facility];  Service: General;  Laterality: N/A;  . PARATHYROIDECTOMY N/A 12/07/2018   Procedure: NECK EXPLORATION, RIGHT INFERIOR PARATHYROIDECTOMY, LEFT THYROID LOBECTOMY;  Surgeon: Armandina Gemma, MD;  Location: [facility];  Service: General;  Laterality: N/A;  . POLYPECTOMY    . RENAL BIOPSY    . VULVA /PERINEUM BIOPSY     nevus on vulva     Current Medications: Current Meds  Medication Sig  . acetaminophen (TYLENOL) 500 MG tablet Take 1,000 mg by mouth 2 (two) times daily.  Marland Kitchen aspirin EC 81 MG tablet Take 81 mg by mouth daily.  Marland Kitchen atorvastatin (LIPITOR) 40 MG tablet Take 40 mg by mouth daily.  Marland Kitchen diltiazem (CARDIZEM CD) 240 MG 24 hr capsule Take 240 mg by mouth daily.  Marland Kitchen losartan (COZAAR) 100 MG tablet Take 100 mg by mouth daily.     Allergies:   Altace [ramipril]   Social  History   Socioeconomic History  . Marital status: Married    Spouse name: Not on file  . Number of children: Not on file  . Years of education: Not on file  . Highest education level: Not on file  Occupational History  . Not on file  Tobacco Use  . Smoking status: Former Research scientist (life sciences)  . Smokeless tobacco: Never Used  Vaping Use  . Vaping Use: Never used  Substance and Sexual Activity  . Alcohol use: No  . Drug use: No  . Sexual activity: Not on file  Other Topics Concern  . Not on file  Social History Narrative  . Not on file   Social Determinants of Health   Financial Resource Strain: Not on file  Food Insecurity: Not on file  Transportation Needs: Not on file   Physical Activity: Not on file  Stress: Not on file  Social Connections: Not on file     Family History: Maternal grandmother died of MI in 28s.  ROS:   Please see the history of present illness.     All other systems reviewed and are negative.  EKGs/Labs/Other Studies Reviewed:    The following studies were reviewed today:   EKG:  EKG is ordered today.  The ekg ordered at prior clinic visit demonstrates normal sinus rhythm, rate 86, LVH, no ST changes, Q waves in V1/2  Recent Labs: 07/13/2019: BUN 50; Creatinine, Ser 3.72; Hemoglobin 12.1; Platelets 221; Potassium 4.6; Sodium 140  Recent Lipid Panel    Component Value Date/Time   CHOL 196 11/04/2010 0817   TRIG 169 (H) 11/04/2010 0817   HDL 43 11/04/2010 0817   CHOLHDL 4.6 11/04/2010 0817   VLDL 34 11/04/2010 0817   LDLCALC 119 (H) 11/04/2010 0817    Physical Exam:    VS:  BP 128/72   Pulse 86   Ht 5\' 7"  (1.702 m)   Wt 180 lb 12.8 oz (82 kg)   BMI 28.32 kg/m     Wt Readings from Last 3 Encounters:  03/04/20 180 lb 12.8 oz (82 kg)  08/20/19 181 lb 12.8 oz (82.5 kg)  08/08/19 181 lb (82.1 kg)     GEN: Well nourished, well developed in no acute distress HEENT: Normal NECK: No JVD; No carotid bruits CARDIAC: RRR, no murmurs, rubs, gallops RESPIRATORY:  Clear to auscultation without rales, wheezing or rhonchi  ABDOMEN: Soft, non-tender, non-distended MUSCULOSKELETAL:  No edema SKIN: Warm and dry NEUROLOGIC:  Alert and oriented x 3 PSYCHIATRIC:  Normal affect   ASSESSMENT:    1. Medication management   2. Chest pain of uncertain etiology   3. Essential hypertension   4. Hyperlipidemia, unspecified hyperlipidemia type   5. Chronic kidney disease (CKD), stage IV (severe) (HCC)    PLAN:    Chest pain: Atypical in description but does have significant CAD risk factors (hypertension, hyperlipidemia, age, former tobacco use).  Lexiscan Myoview on 08/08/2019 showed normal perfusion and EF 59%.  Echocardiogram on  08/08/2019 showed normal LV function, mildly reduced RV function, grade 1 diastolic dysfunction, no significant valvular disease.  No further cardiac work-up recommended.  CKD stage IV-V: Follows with nephrology.  Hypertension: On losartan 100 mg daily and diltiazem 240 mg daily.  Appears controlled  Hyperlipidemia: On atorvastatin 40 mg daily.  LDL 119 on 04/01/2019.  Recommend calcium score for further risk stratification, however she declines at this time.  Will recheck lipid panel  RTC in 1 year   Medication Adjustments/Labs and Tests Ordered: Current  medicines are reviewed at length with the patient today.  Concerns regarding medicines are outlined above.  Orders Placed This Encounter  Procedures  . Lipid panel  . EKG 12-Lead   No orders of the defined types were placed in this encounter.   Patient Instructions  Medication Instructions:  Your Physician recommend you continue on your current medication as directed.    *If you need a refill on your cardiac medications before your next appointment, please call your pharmacy*   Lab Work: Your physician recommends lab work today (lipid).  If you have labs (blood work) drawn today and your tests are completely normal, you will receive your results only by: Marland Kitchen MyChart Message (if you have MyChart) OR . A paper copy in the mail If you have any lab test that is abnormal or we need to change your treatment, we will call you to review the results.   Testing/Procedures: None   Follow-Up: At [facility], you and your health needs are our priority.  As part of our continuing mission to provide you with exceptional heart care, we have created designated Provider Care Teams.  These Care Teams include your primary Cardiologist (physician) and Advanced Practice Providers (APPs -  Physician Assistants and Nurse Practitioners) who all work together to provide you with the care you need, when you need it.  We recommend signing up for the  patient portal called "MyChart".  Sign up information is provided on this After Visit Summary.  MyChart is used to connect with patients for Virtual Visits (Telemedicine).  Patients are able to view lab/test results, encounter notes, upcoming appointments, etc.  Non-urgent messages can be sent to your provider as well.   To learn more about what you can do with MyChart, go to [email or web address].    Your next appointment:   1 year(s)  The format for your next appointment:   In Person  Provider:   Oswaldo Milian, MD        Signed, Donato Heinz, MD  03/04/2020 9:25 AM    [place]

## 2020-03-04 ENCOUNTER — Ambulatory Visit (INDEPENDENT_AMBULATORY_CARE_PROVIDER_SITE_OTHER): Payer: Medicare Other | Admitting: Cardiology

## 2020-03-04 ENCOUNTER — Other Ambulatory Visit: Payer: Self-pay

## 2020-03-04 ENCOUNTER — Encounter: Payer: Self-pay | Admitting: Cardiology

## 2020-03-04 VITALS — BP 128/72 | HR 86 | Ht 67.0 in | Wt 180.8 lb

## 2020-03-04 DIAGNOSIS — N184 Chronic kidney disease, stage 4 (severe): Secondary | ICD-10-CM

## 2020-03-04 DIAGNOSIS — E785 Hyperlipidemia, unspecified: Secondary | ICD-10-CM

## 2020-03-04 DIAGNOSIS — I10 Essential (primary) hypertension: Secondary | ICD-10-CM | POA: Diagnosis not present

## 2020-03-04 DIAGNOSIS — Z79899 Other long term (current) drug therapy: Secondary | ICD-10-CM

## 2020-03-04 DIAGNOSIS — R079 Chest pain, unspecified: Secondary | ICD-10-CM

## 2020-03-04 NOTE — Patient Instructions (Signed)
Medication Instructions:  Your Physician recommend you continue on your current medication as directed.    *If you need a refill on your cardiac medications before your next appointment, please call your pharmacy*   Lab Work: Your physician recommends lab work today (lipid).  If you have labs (blood work) drawn today and your tests are completely normal, you will receive your results only by: Marland Kitchen MyChart Message (if you have MyChart) OR . A paper copy in the mail If you have any lab test that is abnormal or we need to change your treatment, we will call you to review the results.   Testing/Procedures: None   Follow-Up: At [facility], you and your health needs are our priority.  As part of our continuing mission to provide you with exceptional heart care, we have created designated Provider Care Teams.  These Care Teams include your primary Cardiologist (physician) and Advanced Practice Providers (APPs -  Physician Assistants and Nurse Practitioners) who all work together to provide you with the care you need, when you need it.  We recommend signing up for the patient portal called "MyChart".  Sign up information is provided on this After Visit Summary.  MyChart is used to connect with patients for Virtual Visits (Telemedicine).  Patients are able to view lab/test results, encounter notes, upcoming appointments, etc.  Non-urgent messages can be sent to your provider as well.   To learn more about what you can do with MyChart, go to [email or web address].    Your next appointment:   1 year(s)  The format for your next appointment:   In Person  Provider:   Oswaldo Milian, MD

## 2020-03-05 LAB — LIPID PANEL
Chol/HDL Ratio: 5.2 ratio — ABNORMAL HIGH (ref 0.0–4.4)
Cholesterol, Total: 202 mg/dL — ABNORMAL HIGH (ref 100–199)
HDL: 39 mg/dL — ABNORMAL LOW (ref >39)
LDL Chol Calc (NIH): 119 mg/dL — ABNORMAL HIGH (ref 0–99)
Triglycerides: 252 mg/dL — ABNORMAL HIGH (ref 0–149)
VLDL Cholesterol Cal: 44 mg/dL — ABNORMAL HIGH (ref 5–40)

## 2020-03-07 ENCOUNTER — Ambulatory Visit (HOSPITAL_COMMUNITY)
Admission: EM | Admit: 2020-03-07 | Discharge: 2020-03-07 | Disposition: A | Discharge status: Home / Self Care | Payer: Medicare Other | Attending: Student | Admitting: Student

## 2020-03-07 ENCOUNTER — Other Ambulatory Visit: Payer: Self-pay

## 2020-03-07 ENCOUNTER — Encounter (HOSPITAL_COMMUNITY): Payer: Self-pay

## 2020-03-07 DIAGNOSIS — Z853 Personal history of malignant neoplasm of breast: Secondary | ICD-10-CM

## 2020-03-07 DIAGNOSIS — T783XXA Angioneurotic edema, initial encounter: Secondary | ICD-10-CM

## 2020-03-07 DIAGNOSIS — N289 Disorder of kidney and ureter, unspecified: Secondary | ICD-10-CM

## 2020-03-07 MED ORDER — PREDNISONE 20 MG PO TABS
40.0000 mg | ORAL_TABLET | Freq: Every day | ORAL | 0 refills | Status: AC
Start: 2020-03-07 — End: 2020-03-12

## 2020-03-07 MED ORDER — METHYLPREDNISOLONE SODIUM SUCC 125 MG IJ SOLR
80.0000 mg | Freq: Once | INTRAMUSCULAR | Status: AC
  Administered 2020-03-07: 80 mg via INTRAMUSCULAR

## 2020-03-07 MED ORDER — METHYLPREDNISOLONE SODIUM SUCC 125 MG IJ SOLR
INTRAMUSCULAR | Status: AC
  Filled 2020-03-07: qty 2

## 2020-03-07 NOTE — ED Provider Notes (Signed)
[facility]    CSN: [id number] Arrival date & time: 03/07/20  1532      History   Chief Complaint Chief Complaint  Patient presents with  . Facial Swelling    Since this am    HPI Karen Myers is a 75 y.o. female presenting with facial swelling x8 hours. History breast cancer, CINIII, GERD, hyperlipidemia, hypertension, primary hyperparathyroidism, renal insufficiency, hypertension, renal insufficiency. Presenting today with facial swelling since this morning. The swelling began when she was sorting her and her husbands medications, and is limited to her upper lip. Pt is unaware of any substance she came into contact with that would have caused this. Denies shortness of breath, dyspnea on exertion, tongue swelling, difficulty swallowing, chest pain.   HPI  Past Medical History:  Diagnosis Date  . Arthritis   . Breast cancer ([place])    left breast cancer   . Breast cancer ([place])   . Chronic kidney disease   . CIN III (cervical intraepithelial neoplasia III)    prior to LEEP procedure  . GERD (gastroesophageal reflux disease)   . High cholesterol   . Hypertension     Patient Active Problem List   Diagnosis Date Noted  . Primary hyperparathyroidism ([place]) 12/07/2018  . Hyperparathyroidism, primary ([place]) 05/27/2017  . CIN 3 - cervical intraepithelial neoplasia grade 3 10/06/2011  . Breast CA ([place]) 12/02/2010  . Renal insufficiency 12/02/2010  . Hypertension 12/02/2010  . Hyperlipidemia 12/02/2010    Past Surgical History:  Procedure Laterality Date  . BREAST LUMPECTOMY    . CERVICAL BIOPSY  W/ LOOP ELECTRODE EXCISION  2005  . COLONOSCOPY    . HYSTEROSCOPY WITH D & C  2005  . MASTECTOMY  2002   bilateral   . PARATHYROIDECTOMY N/A 06/01/2017   Procedure: PARATHYROIDECTOMY;  Surgeon: Armandina Gemma, MD;  Location: [facility];  Service: General;  Laterality: N/A;  . PARATHYROIDECTOMY N/A 12/07/2018   Procedure: NECK EXPLORATION, RIGHT INFERIOR  PARATHYROIDECTOMY, LEFT THYROID LOBECTOMY;  Surgeon: Armandina Gemma, MD;  Location: [facility];  Service: General;  Laterality: N/A;  . POLYPECTOMY    . RENAL BIOPSY    . VULVA /PERINEUM BIOPSY     nevus on vulva     OB History    Gravida  2   Para  2   Term      Preterm      AB      Living  2     SAB      IAB      Ectopic      Multiple      Live Births               Home Medications    Prior to Admission medications   Medication Sig Start Date End Date Taking? Authorizing Provider  acetaminophen (TYLENOL) 500 MG tablet Take 1,000 mg by mouth 2 (two) times daily.   Yes [provider]  aspirin EC 81 MG tablet Take 81 mg by mouth daily.   Yes [provider]  atorvastatin (LIPITOR) 40 MG tablet Take 40 mg by mouth daily.   Yes [provider]  diltiazem (CARDIZEM CD) 240 MG 24 hr capsule Take 240 mg by mouth daily. 04/25/17  Yes [provider]  predniSONE (DELTASONE) 20 MG tablet Take 2 tablets (40 mg total) by mouth daily for 5 days. 03/07/20 03/12/20 Yes Hazel Sams, PA-C  losartan (COZAAR) 100 MG tablet Take 100 mg by mouth daily.  03/07/20  [provider]    Family History History reviewed. No pertinent family history.  Social History Social History   Tobacco Use  . Smoking status: Former Research scientist (life sciences)  . Smokeless tobacco: Never Used  Vaping Use  . Vaping Use: Never used  Substance Use Topics  . Alcohol use: No  . Drug use: No     Allergies   Altace [ramipril]   Review of Systems Review of Systems  Skin:       Upper lip swelling  All other systems reviewed and are negative.    Physical Exam Triage Vital Signs ED Triage Vitals  Enc Vitals Group     BP 03/07/20 1611 (!) 142/90     Pulse Rate 03/07/20 1611 86     Resp 03/07/20 1611 17     Temp 03/07/20 1611 98.2 F (36.8 C)     Temp Source 03/07/20 1611 Oral     SpO2 03/07/20 1611 99 %     Weight --      Height --      Head Circumference --       Peak Flow --      Pain Score 03/07/20 1613 0     Pain Loc --      Pain Edu? --      Excl. in [place]? --    No data found.  Updated Vital Signs BP (!) 142/90 (BP Location: Right Arm)   Pulse 86   Temp 98.2 F (36.8 C) (Oral)   Resp 17   SpO2 99%   Visual Acuity Right Eye Distance:   Left Eye Distance:   Bilateral Distance:    Right Eye Near:   Left Eye Near:    Bilateral Near:     Physical Exam Vitals reviewed.  Constitutional:      Appearance: Normal appearance.  HENT:     Mouth/Throat:     Mouth: Angioedema present.     Comments: Swelling of upper lip No swelling of tongue or oropharynx. Uvula midline, palate raises symmetrically.  Cardiovascular:     Rate and Rhythm: Normal rate and regular rhythm.     Heart sounds: Normal heart sounds.  Pulmonary:     Effort: Pulmonary effort is normal.     Breath sounds: Normal breath sounds.     Comments: Pt breathing comfortably on room air. Musculoskeletal:     Cervical back: Normal range of motion and neck supple.  Neurological:     General: No focal deficit present.     Mental Status: She is alert and oriented to person, place, and time.  Psychiatric:        Mood and Affect: Mood normal.        Behavior: Behavior normal.        Thought Content: Thought content normal.        Judgment: Judgment normal.      UC Treatments / Results  Labs (all labs ordered are listed, but only abnormal results are displayed) Labs Reviewed - No data to display  EKG   Radiology No results found.  Procedures Procedures (including critical care time)  Medications Ordered in UC Medications  methylPREDNISolone sodium succinate (SOLU-MEDROL) 125 mg/2 mL injection 80 mg (has no administration in time range)    Initial Impression / Assessment and Plan / UC Course  I have reviewed the triage vital signs and the nursing notes.  Pertinent labs & imaging results that were available during my care of the patient were reviewed  by  me and considered in my medical decision making (see chart for details).      afebrile nontachycardic nontachypneic, oxygenating well on room air. No tongue or pharynx swelling, pt breathing comfortably.   Solumedrol administered today, and she will start prednisone 40mg  x5 days tomorrow.   Return precautions- worsening of swelling despite treatment; shortness of breath; chest pain, tongue swelling, throat swelling, etc. Patient verbalizes understanding and agreement.   Spent over 30 minutes obtaining H&P, performing physical, discussing results, treatment plan and plan for follow-up with patient. Patient agrees with plan.    Final Clinical Impressions(s) / UC Diagnoses   Final diagnoses:  Angioedema of lips, initial encounter  Renal insufficiency  History of breast cancer     Discharge Instructions     -Starting tomorrow, take the prednisone 2 pills daily for 5 days -Head to ED if tongue/throat swelling, trouble breathing, chest pain, etc.     ED Prescriptions    Medication Sig Dispense Auth. Provider   predniSONE (DELTASONE) 20 MG tablet Take 2 tablets (40 mg total) by mouth daily for 5 days. 10 tablet Hazel Sams, PA-C     PDMP not reviewed this encounter.   Hazel Sams, PA-C 03/07/20 1643

## 2020-03-07 NOTE — Discharge Instructions (Addendum)
-  Starting tomorrow, take the prednisone 2 pills daily for 5 days -Head to ED if tongue/throat swelling, trouble breathing, chest pain, etc.

## 2020-03-07 NOTE — ED Triage Notes (Signed)
Patient complains of facial swelling since this morning. The swelling began when she was sorting her and her husbands medications. Pt is aunaware of any substance she came into contact with that would have caused this. Pt is aox4 and ambulatory.

## 2020-03-16 DIAGNOSIS — E213 Hyperparathyroidism, unspecified: Secondary | ICD-10-CM | POA: Diagnosis not present

## 2020-03-16 DIAGNOSIS — N042 Nephrotic syndrome with diffuse membranous glomerulonephritis: Secondary | ICD-10-CM | POA: Diagnosis not present

## 2020-03-16 DIAGNOSIS — D631 Anemia in chronic kidney disease: Secondary | ICD-10-CM | POA: Diagnosis not present

## 2020-03-16 DIAGNOSIS — N189 Chronic kidney disease, unspecified: Secondary | ICD-10-CM | POA: Diagnosis not present

## 2020-03-16 DIAGNOSIS — N184 Chronic kidney disease, stage 4 (severe): Secondary | ICD-10-CM | POA: Diagnosis not present

## 2020-03-16 DIAGNOSIS — I129 Hypertensive chronic kidney disease with stage 1 through stage 4 chronic kidney disease, or unspecified chronic kidney disease: Secondary | ICD-10-CM | POA: Diagnosis not present

## 2020-03-24 DIAGNOSIS — E892 Postprocedural hypoparathyroidism: Secondary | ICD-10-CM | POA: Diagnosis not present

## 2020-03-24 DIAGNOSIS — N184 Chronic kidney disease, stage 4 (severe): Secondary | ICD-10-CM | POA: Diagnosis not present

## 2020-03-24 DIAGNOSIS — E349 Endocrine disorder, unspecified: Secondary | ICD-10-CM | POA: Diagnosis not present

## 2020-03-24 DIAGNOSIS — E559 Vitamin D deficiency, unspecified: Secondary | ICD-10-CM | POA: Diagnosis not present

## 2020-03-24 DIAGNOSIS — E210 Primary hyperparathyroidism: Secondary | ICD-10-CM | POA: Diagnosis not present

## 2020-04-01 DIAGNOSIS — E892 Postprocedural hypoparathyroidism: Secondary | ICD-10-CM | POA: Diagnosis not present

## 2020-04-01 DIAGNOSIS — E349 Endocrine disorder, unspecified: Secondary | ICD-10-CM | POA: Diagnosis not present

## 2020-04-01 DIAGNOSIS — Z9009 Acquired absence of other part of head and neck: Secondary | ICD-10-CM | POA: Diagnosis not present

## 2020-04-07 DIAGNOSIS — E213 Hyperparathyroidism, unspecified: Secondary | ICD-10-CM | POA: Diagnosis not present

## 2020-04-07 DIAGNOSIS — N184 Chronic kidney disease, stage 4 (severe): Secondary | ICD-10-CM | POA: Diagnosis not present

## 2020-04-07 DIAGNOSIS — E559 Vitamin D deficiency, unspecified: Secondary | ICD-10-CM | POA: Diagnosis not present

## 2020-04-10 DIAGNOSIS — M8580 Other specified disorders of bone density and structure, unspecified site: Secondary | ICD-10-CM | POA: Diagnosis not present

## 2020-04-10 DIAGNOSIS — N184 Chronic kidney disease, stage 4 (severe): Secondary | ICD-10-CM | POA: Diagnosis not present

## 2020-04-10 DIAGNOSIS — E559 Vitamin D deficiency, unspecified: Secondary | ICD-10-CM | POA: Diagnosis not present

## 2020-04-10 DIAGNOSIS — E213 Hyperparathyroidism, unspecified: Secondary | ICD-10-CM | POA: Diagnosis not present

## 2020-04-14 DIAGNOSIS — N042 Nephrotic syndrome with diffuse membranous glomerulonephritis: Secondary | ICD-10-CM | POA: Diagnosis not present

## 2020-04-14 DIAGNOSIS — E7800 Pure hypercholesterolemia, unspecified: Secondary | ICD-10-CM | POA: Diagnosis not present

## 2020-04-14 DIAGNOSIS — N184 Chronic kidney disease, stage 4 (severe): Secondary | ICD-10-CM | POA: Diagnosis not present

## 2020-04-14 DIAGNOSIS — Z0000 Encounter for general adult medical examination without abnormal findings: Secondary | ICD-10-CM | POA: Diagnosis not present

## 2020-04-14 DIAGNOSIS — R7301 Impaired fasting glucose: Secondary | ICD-10-CM | POA: Diagnosis not present

## 2020-04-14 DIAGNOSIS — E213 Hyperparathyroidism, unspecified: Secondary | ICD-10-CM | POA: Diagnosis not present

## 2020-04-14 DIAGNOSIS — I10 Essential (primary) hypertension: Secondary | ICD-10-CM | POA: Diagnosis not present

## 2020-05-01 ENCOUNTER — Encounter (HOSPITAL_COMMUNITY): Payer: Self-pay | Admitting: Emergency Medicine

## 2020-05-01 ENCOUNTER — Other Ambulatory Visit: Payer: Self-pay

## 2020-05-01 ENCOUNTER — Ambulatory Visit (HOSPITAL_COMMUNITY)
Admission: EM | Admit: 2020-05-01 | Discharge: 2020-05-01 | Disposition: A | Discharge status: Home / Self Care | Payer: Medicare Other | Attending: Family Medicine | Admitting: Family Medicine

## 2020-05-01 ENCOUNTER — Ambulatory Visit (INDEPENDENT_AMBULATORY_CARE_PROVIDER_SITE_OTHER): Payer: Medicare Other

## 2020-05-01 DIAGNOSIS — M25552 Pain in left hip: Secondary | ICD-10-CM

## 2020-05-01 MED ORDER — DICLOFENAC SODIUM 1 % EX GEL: 2.0000 g | Freq: Four times a day (QID) | CUTANEOUS | 0 refills | Status: DC

## 2020-05-01 NOTE — Discharge Instructions (Addendum)
Make sure to warm up before exercise daily

## 2020-05-01 NOTE — ED Provider Notes (Addendum)
[place]    CSN: [id number] Arrival date & time: 05/01/20  1601      History   Chief Complaint Chief Complaint  Patient presents with  . Hip Pain    Left    HPI Karen Myers is a 75 y.o. female.   Patient in today with 4-day history of left lateral hip pain.  She states she works out at [employer] nearly every day, walking about a mile and using some machines to help her leg strength.  She states the pain is mostly only noticeable after sitting for long periods of time and trying to get back up.  She has stiffness with the pain.  Pain improves once she gets up and moving for a while.  Denies numbness, tingling, weakness, joint stability, swelling, discoloration, obvious injury, history of hip issues.  Per chart review, last image done to the hip was in 2009 showing degenerative changes at this time.  She has been taking Tylenol as needed with mild temporary relief.     Past Medical History:  Diagnosis Date  . Arthritis   . Breast cancer ([place])    left breast cancer   . Breast cancer ([place])   . Chronic kidney disease   . CIN III (cervical intraepithelial neoplasia III)    prior to LEEP procedure  . GERD (gastroesophageal reflux disease)   . High cholesterol   . Hypertension     Patient Active Problem List   Diagnosis Date Noted  . Primary hyperparathyroidism ([place]) 12/07/2018  . Hyperparathyroidism, primary ([place]) 05/27/2017  . CIN 3 - cervical intraepithelial neoplasia grade 3 10/06/2011  . Breast CA ([place]) 12/02/2010  . Renal insufficiency 12/02/2010  . Hypertension 12/02/2010  . Hyperlipidemia 12/02/2010    Past Surgical History:  Procedure Laterality Date  . BREAST LUMPECTOMY    . CERVICAL BIOPSY  W/ LOOP ELECTRODE EXCISION  2005  . COLONOSCOPY    . HYSTEROSCOPY WITH D & C  2005  . MASTECTOMY  2002   bilateral   . PARATHYROIDECTOMY N/A 06/01/2017   Procedure: PARATHYROIDECTOMY;  Surgeon: Armandina Gemma, MD;  Location: [facility];  Service:  General;  Laterality: N/A;  . PARATHYROIDECTOMY N/A 12/07/2018   Procedure: NECK EXPLORATION, RIGHT INFERIOR PARATHYROIDECTOMY, LEFT THYROID LOBECTOMY;  Surgeon: Armandina Gemma, MD;  Location: [facility];  Service: General;  Laterality: N/A;  . POLYPECTOMY    . RENAL BIOPSY    . VULVA /PERINEUM BIOPSY     nevus on vulva     OB History    Gravida  2   Para  2   Term      Preterm      AB      Living  2     SAB      IAB      Ectopic      Multiple      Live Births               Home Medications    Prior to Admission medications   Medication Sig Start Date End Date Taking? Authorizing Provider  diclofenac Sodium (VOLTAREN) 1 % GEL Apply 2 g topically 4 (four) times daily. 05/01/20  Yes Volney American, PA-C  acetaminophen (TYLENOL) 500 MG tablet Take 1,000 mg by mouth 2 (two) times daily.    [provider]  aspirin EC 81 MG tablet Take 81 mg by mouth daily.    [provider]  atorvastatin (LIPITOR) 40 MG tablet Take  40 mg by mouth daily.    [provider]  diltiazem (CARDIZEM CD) 240 MG 24 hr capsule Take 240 mg by mouth daily. 04/25/17   [provider]  losartan (COZAAR) 100 MG tablet Take 100 mg by mouth daily.  03/07/20  [provider]    Family History History reviewed. No pertinent family history.  Social History Social History   Tobacco Use  . Smoking status: Former Research scientist (life sciences)  . Smokeless tobacco: Never Used  Vaping Use  . Vaping Use: Never used  Substance Use Topics  . Alcohol use: No  . Drug use: No     Allergies   Altace [ramipril]   Review of Systems Review of Systems Per HPI Physical Exam Triage Vital Signs ED Triage Vitals [05/01/20 1013]  Enc Vitals Group     BP (!) 170/116     Pulse Rate 95     Resp 17     Temp 98.1 F (36.7 C)     Temp Source Oral     SpO2 99 %     Weight      Height      Head Circumference      Peak Flow      Pain Score 8     Pain Loc      Pain Edu?       Excl. in [place]?    No data found.  Updated Vital Signs BP (!) 180/110 (BP Location: Left Arm)   Pulse 95   Temp 98.1 F (36.7 C) (Oral)   Resp 17   SpO2 99%   Visual Acuity Right Eye Distance:   Left Eye Distance:   Bilateral Distance:    Right Eye Near:   Left Eye Near:    Bilateral Near:     Physical Exam Vitals and nursing note reviewed.  Constitutional:      Appearance: Normal appearance. She is not ill-appearing.  HENT:     Head: Atraumatic.     Mouth/Throat:     Mouth: Mucous membranes are moist.  Eyes:     Extraocular Movements: Extraocular movements intact.     Conjunctiva/sclera: Conjunctivae normal.  Cardiovascular:     Rate and Rhythm: Normal rate and regular rhythm.     Heart sounds: Normal heart sounds.  Pulmonary:     Effort: Pulmonary effort is normal.     Breath sounds: Normal breath sounds.  Musculoskeletal:        General: No swelling, tenderness, deformity or signs of injury. Normal range of motion.     Cervical back: Normal range of motion and neck supple.     Comments: Excellent strength in all directions at left hip to resistance, minimal pain rendered with these exercises  Skin:    General: Skin is warm and dry.  Neurological:     Mental Status: She is alert and oriented to person, place, and time.     Motor: No weakness.     Gait: Gait normal.     Comments: Left lower extremity neurovascularly intact  Psychiatric:        Mood and Affect: Mood normal.        Thought Content: Thought content normal.        Judgment: Judgment normal.      UC Treatments / Results  Labs (all labs ordered are listed, but only abnormal results are displayed) Labs Reviewed - No data to display  EKG   Radiology DG Hip Unilat With Pelvis 2-3 Views Left  Result Date: 05/01/2020 CLINICAL DATA:  Left-sided hip pain over the last 4 days. EXAM: DG HIP (WITH OR WITHOUT PELVIS) 2-3V LEFT COMPARISON:  None. FINDINGS: There is no evidence of hip fracture or  dislocation. There is no evidence of arthropathy or other focal bone abnormality. IMPRESSION: Normal radiographs. No degenerative disease or other left hip region finding. Electronically Signed   By: Nelson Chimes M.D.   On: 05/01/2020 11:26    Procedures Procedures (including critical care time)  Medications Ordered in UC Medications - No data to display  Initial Impression / Assessment and Plan / UC Course  I have reviewed the triage vital signs and the nursing notes.  Pertinent labs & imaging results that were available during my care of the patient were reviewed by me and considered in my medical decision making (see chart for details).     Left hip x-ray negative for bony abnormality, suspect mild inflammatory flare versus muscular overuse type injury.  She does not currently warm up before exercising daily so discussed importance of this and reviewed some stretches, foam rolling exercises that she can do to help.  Epsom salt soaks, Voltaren gel, Tylenol as needed additionally.  Final Clinical Impressions(s) / UC Diagnoses   Final diagnoses:  Left hip pain     Discharge Instructions     Make sure to warm up before exercise daily    ED Prescriptions    Medication Sig Dispense Auth. Provider   diclofenac Sodium (VOLTAREN) 1 % GEL Apply 2 g topically 4 (four) times daily. 100 g Volney American, [place]     PDMP not reviewed this encounter.   Jaiya, [place], [place] 05/01/20 [place], [place], [place] 05/02/20 1725

## 2020-05-01 NOTE — ED Triage Notes (Addendum)
Pt presents with left side hip pain xs 4 days. States hip is very stiff in the am after getting up.   Pt's BP taken twice, once on each arm with different machines, pt states forgot to take BP medication this am.

## 2020-05-05 DIAGNOSIS — E7800 Pure hypercholesterolemia, unspecified: Secondary | ICD-10-CM | POA: Diagnosis not present

## 2020-05-05 DIAGNOSIS — C50919 Malignant neoplasm of unspecified site of unspecified female breast: Secondary | ICD-10-CM | POA: Diagnosis not present

## 2020-05-05 DIAGNOSIS — N042 Nephrotic syndrome with diffuse membranous glomerulonephritis: Secondary | ICD-10-CM | POA: Diagnosis not present

## 2020-05-05 DIAGNOSIS — M8580 Other specified disorders of bone density and structure, unspecified site: Secondary | ICD-10-CM | POA: Diagnosis not present

## 2020-05-05 DIAGNOSIS — N184 Chronic kidney disease, stage 4 (severe): Secondary | ICD-10-CM | POA: Diagnosis not present

## 2020-05-05 DIAGNOSIS — I10 Essential (primary) hypertension: Secondary | ICD-10-CM | POA: Diagnosis not present

## 2020-05-05 DIAGNOSIS — M25552 Pain in left hip: Secondary | ICD-10-CM | POA: Diagnosis not present

## 2020-05-05 DIAGNOSIS — E785 Hyperlipidemia, unspecified: Secondary | ICD-10-CM | POA: Diagnosis not present

## 2020-05-18 DIAGNOSIS — M25552 Pain in left hip: Secondary | ICD-10-CM | POA: Diagnosis not present

## 2020-06-03 DIAGNOSIS — M25552 Pain in left hip: Secondary | ICD-10-CM | POA: Diagnosis not present

## 2020-06-16 DIAGNOSIS — N184 Chronic kidney disease, stage 4 (severe): Secondary | ICD-10-CM | POA: Diagnosis not present

## 2020-06-16 DIAGNOSIS — M8580 Other specified disorders of bone density and structure, unspecified site: Secondary | ICD-10-CM | POA: Diagnosis not present

## 2020-06-16 DIAGNOSIS — I10 Essential (primary) hypertension: Secondary | ICD-10-CM | POA: Diagnosis not present

## 2020-06-16 DIAGNOSIS — E7800 Pure hypercholesterolemia, unspecified: Secondary | ICD-10-CM | POA: Diagnosis not present

## 2020-06-16 DIAGNOSIS — E785 Hyperlipidemia, unspecified: Secondary | ICD-10-CM | POA: Diagnosis not present

## 2020-06-16 DIAGNOSIS — N042 Nephrotic syndrome with diffuse membranous glomerulonephritis: Secondary | ICD-10-CM | POA: Diagnosis not present

## 2020-06-22 DIAGNOSIS — M25552 Pain in left hip: Secondary | ICD-10-CM | POA: Diagnosis not present

## 2020-06-26 DIAGNOSIS — M25552 Pain in left hip: Secondary | ICD-10-CM | POA: Diagnosis not present

## 2020-06-30 DIAGNOSIS — M25552 Pain in left hip: Secondary | ICD-10-CM | POA: Diagnosis not present

## 2020-07-03 DIAGNOSIS — M25552 Pain in left hip: Secondary | ICD-10-CM | POA: Diagnosis not present

## 2020-07-14 DIAGNOSIS — E213 Hyperparathyroidism, unspecified: Secondary | ICD-10-CM | POA: Diagnosis not present

## 2020-07-14 DIAGNOSIS — E559 Vitamin D deficiency, unspecified: Secondary | ICD-10-CM | POA: Diagnosis not present

## 2020-07-15 ENCOUNTER — Other Ambulatory Visit: Payer: Self-pay | Admitting: Sports Medicine

## 2020-07-15 ENCOUNTER — Ambulatory Visit
Admission: RE | Admit: 2020-07-15 | Discharge: 2020-07-15 | Disposition: A | Discharge status: Home / Self Care | Payer: Medicare Other | Source: Ambulatory Visit | Attending: Sports Medicine | Admitting: Sports Medicine

## 2020-07-15 DIAGNOSIS — M25559 Pain in unspecified hip: Secondary | ICD-10-CM

## 2020-07-15 DIAGNOSIS — M8580 Other specified disorders of bone density and structure, unspecified site: Secondary | ICD-10-CM | POA: Diagnosis not present

## 2020-07-15 DIAGNOSIS — M25552 Pain in left hip: Secondary | ICD-10-CM | POA: Diagnosis not present

## 2020-07-15 DIAGNOSIS — E213 Hyperparathyroidism, unspecified: Secondary | ICD-10-CM | POA: Diagnosis not present

## 2020-07-15 DIAGNOSIS — N184 Chronic kidney disease, stage 4 (severe): Secondary | ICD-10-CM | POA: Diagnosis not present

## 2020-07-15 DIAGNOSIS — E559 Vitamin D deficiency, unspecified: Secondary | ICD-10-CM | POA: Diagnosis not present

## 2020-07-22 DIAGNOSIS — C50912 Malignant neoplasm of unspecified site of left female breast: Secondary | ICD-10-CM | POA: Diagnosis not present

## 2020-08-03 ENCOUNTER — Encounter: Payer: Self-pay | Admitting: Family Medicine

## 2020-08-03 ENCOUNTER — Ambulatory Visit: Payer: Self-pay

## 2020-08-03 ENCOUNTER — Other Ambulatory Visit: Payer: Self-pay

## 2020-08-03 ENCOUNTER — Ambulatory Visit (INDEPENDENT_AMBULATORY_CARE_PROVIDER_SITE_OTHER): Payer: Medicare Other | Admitting: Family Medicine

## 2020-08-03 VITALS — BP 148/98 | Ht 67.5 in | Wt 180.0 lb

## 2020-08-03 DIAGNOSIS — M25562 Pain in left knee: Secondary | ICD-10-CM | POA: Diagnosis not present

## 2020-08-03 NOTE — Patient Instructions (Signed)
You have IT band syndrome. I suspect the injection Dr. Sheppard Coil gave you helped with the pain higher up and you're just dealing now with weakness from the hip and irritation of the mid-IT band. This should improve with time over the next 2-3 months with the exercises. Avoid painful activities as much as possible. Ice over area of pain 3-4 times a day for 15 minutes at a time Hip side raise exercise 3 sets of 10 once a day - add weights if this becomes too easy. Stretches - pick 2-3 and hold for 20-30 seconds x 3 - do once or twice a day. Tylenol and/or aleve as needed for pain. Follow up with Dr Sheppard Coil.

## 2020-08-03 NOTE — Assessment & Plan Note (Addendum)
Chronic. Normal left hip x-rays x 2. Some improvement with pain with left greater trochanter injection. Now has residual lateral thigh pain distal to greater trochanter. Possible etiology is IT band. She has significant weakness in her abductor and hip stabilizing muscles on exam as well. POC Ultrasound negative for any obvious findings. Recommended home strengthening exercises and RTC in 3-4 weeks to evaluate for improvement. OTC tylenol/ibuprofen PRN.

## 2020-08-03 NOTE — Progress Notes (Signed)
Subjective:   Patient ID: Karen Myers    DOB: 01-05-1946, 75 y.o. female   MRN: [id number]  Karen Myers is a 75 y.o. female here for left hip pain x 3 months. Seen in Urgent care on 3/25 for similar pain. 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 She notes that she was doing leg extension exercises on a machine when she first developed the pain. She had left hip x-ray completed that was negative. She had repeat x-rays on 6/8 due to persistent pain that was again negative. She describes the pain as being mid lateral thigh. Hurts first thing in the morning and if she sits for a prolonged period of time. She has been treated with greater trochanter injection with improvement in symptoms for about 3 days. She has completed a few days of formal PT and home exercises but no improvement. She takes tylenol occasionally with no improvement. She has also tried topical analgesics with no improvement.  Review of Systems:  Per HPI.   Objective:   BP (!) 148/98   Ht 5' 7.5" (1.715 m)   Wt 180 lb (81.6 kg)   BMI 27.78 kg/m  Vitals and nursing note reviewed.  General: pleasant elderly woman, appears younger than stated age, sitting comfortably in exam chair, well nourished, well developed, in no acute distress with non-toxic appearance Resp: breathing comfortably on room air, speaking in full sentences Skin: warm, dry MSK:  Left Hip:  - Inspection: No gross deformity, no swelling, erythema, or ecchymosis - Palpation: TTP along proximal lateral thigh distal to greater trochanter, no pain over greater trochanter - ROM: Normal range of motion on Flexion, extension, abduction, internal and external rotation - Strength: 4/5 strength in abduction  - Neuro/vasc: NV intact distally - Special Tests: Negative FABER and FADIR. Positive Trendelenberg.  Negative Ober's, negative piriformis, neg SLR   Right Hip:  - Inspection: No gross deformity, no swelling,  erythema, or ecchymosis - Palpation: No TTP - ROM: Normal range of motion on Flexion, extension, abduction, internal and external rotation - Strength: Normal strength. - Neuro/vasc: NV intact distally - Special Tests: Negative FABER and FADIR.  Positive trendelenburg  Neuro: Alert and oriented, speech normal  Ultrasound : Left lateral thigh No soft tissue mass.  Vastus lateralis, intermedius, and rectus femoris normal.  Mild hypoechoic change of IT band in area of pain.   Impression:  IT band syndrome   Ultrasound and interpretation by Dr. Tarry Kos and Dr. Barbaraann Barthel  Assessment & Plan:   Arthralgia of left lower leg Chronic. Normal left hip x-rays x 2. Some improvement with pain with left greater trochanter injection. Now has residual lateral thigh pain distal to greater trochanter. Possible etiology is IT band. She has significant weakness in her abductor and hip stabilizing muscles on exam as well. POC Ultrasound negative for any obvious findings. Recommended home strengthening exercises and RTC in 3-4 weeks to evaluate for improvement. OTC tylenol/ibuprofen PRN.  Orders Placed This Encounter  Procedures   [place] LIMITED JOINT SPACE STRUCTURES LOW LEFT    Standing Status:   Future    Number of Occurrences:   1    Standing Expiration Date:   08/03/2021    Order Specific Question:   Reason for Exam (SYMPTOM  OR DIAGNOSIS REQUIRED)    Answer:   left leg pain    Order Specific Question:   Preferred imaging location?    Answer:   Internal   No orders  of the defined types were placed in this encounter.     Mina Marble, DO PGY-3, [facility] 08/03/2020 10:29 AM

## 2020-08-04 DIAGNOSIS — C50912 Malignant neoplasm of unspecified site of left female breast: Secondary | ICD-10-CM | POA: Diagnosis not present

## 2020-08-21 DIAGNOSIS — N390 Urinary tract infection, site not specified: Secondary | ICD-10-CM | POA: Diagnosis not present

## 2020-08-21 DIAGNOSIS — N184 Chronic kidney disease, stage 4 (severe): Secondary | ICD-10-CM | POA: Diagnosis not present

## 2020-08-21 DIAGNOSIS — N189 Chronic kidney disease, unspecified: Secondary | ICD-10-CM | POA: Diagnosis not present

## 2020-08-21 DIAGNOSIS — D631 Anemia in chronic kidney disease: Secondary | ICD-10-CM | POA: Diagnosis not present

## 2020-08-21 DIAGNOSIS — N042 Nephrotic syndrome with diffuse membranous glomerulonephritis: Secondary | ICD-10-CM | POA: Diagnosis not present

## 2020-08-21 DIAGNOSIS — I129 Hypertensive chronic kidney disease with stage 1 through stage 4 chronic kidney disease, or unspecified chronic kidney disease: Secondary | ICD-10-CM | POA: Diagnosis not present

## 2020-08-21 DIAGNOSIS — E213 Hyperparathyroidism, unspecified: Secondary | ICD-10-CM | POA: Diagnosis not present

## 2020-09-29 DIAGNOSIS — N184 Chronic kidney disease, stage 4 (severe): Secondary | ICD-10-CM | POA: Diagnosis not present

## 2020-09-29 DIAGNOSIS — C50919 Malignant neoplasm of unspecified site of unspecified female breast: Secondary | ICD-10-CM | POA: Diagnosis not present

## 2020-09-29 DIAGNOSIS — E7800 Pure hypercholesterolemia, unspecified: Secondary | ICD-10-CM | POA: Diagnosis not present

## 2020-09-29 DIAGNOSIS — M8580 Other specified disorders of bone density and structure, unspecified site: Secondary | ICD-10-CM | POA: Diagnosis not present

## 2020-09-29 DIAGNOSIS — E785 Hyperlipidemia, unspecified: Secondary | ICD-10-CM | POA: Diagnosis not present

## 2020-09-29 DIAGNOSIS — N183 Chronic kidney disease, stage 3 unspecified: Secondary | ICD-10-CM | POA: Diagnosis not present

## 2020-09-29 DIAGNOSIS — I10 Essential (primary) hypertension: Secondary | ICD-10-CM | POA: Diagnosis not present

## 2020-09-29 DIAGNOSIS — N042 Nephrotic syndrome with diffuse membranous glomerulonephritis: Secondary | ICD-10-CM | POA: Diagnosis not present

## 2020-09-30 DIAGNOSIS — N184 Chronic kidney disease, stage 4 (severe): Secondary | ICD-10-CM | POA: Diagnosis not present

## 2020-09-30 DIAGNOSIS — Z23 Encounter for immunization: Secondary | ICD-10-CM | POA: Diagnosis not present

## 2020-10-21 DIAGNOSIS — N184 Chronic kidney disease, stage 4 (severe): Secondary | ICD-10-CM | POA: Diagnosis not present

## 2020-10-21 DIAGNOSIS — I129 Hypertensive chronic kidney disease with stage 1 through stage 4 chronic kidney disease, or unspecified chronic kidney disease: Secondary | ICD-10-CM | POA: Diagnosis not present

## 2020-10-21 DIAGNOSIS — N189 Chronic kidney disease, unspecified: Secondary | ICD-10-CM | POA: Diagnosis not present

## 2020-10-21 DIAGNOSIS — N042 Nephrotic syndrome with diffuse membranous glomerulonephritis: Secondary | ICD-10-CM | POA: Diagnosis not present

## 2020-10-21 DIAGNOSIS — E213 Hyperparathyroidism, unspecified: Secondary | ICD-10-CM | POA: Diagnosis not present

## 2020-10-21 DIAGNOSIS — D631 Anemia in chronic kidney disease: Secondary | ICD-10-CM | POA: Diagnosis not present

## 2020-12-08 DIAGNOSIS — N185 Chronic kidney disease, stage 5: Secondary | ICD-10-CM | POA: Diagnosis not present

## 2020-12-08 DIAGNOSIS — N189 Chronic kidney disease, unspecified: Secondary | ICD-10-CM | POA: Diagnosis not present

## 2020-12-08 DIAGNOSIS — D631 Anemia in chronic kidney disease: Secondary | ICD-10-CM | POA: Diagnosis not present

## 2020-12-08 DIAGNOSIS — N184 Chronic kidney disease, stage 4 (severe): Secondary | ICD-10-CM | POA: Diagnosis not present

## 2020-12-08 DIAGNOSIS — E213 Hyperparathyroidism, unspecified: Secondary | ICD-10-CM | POA: Diagnosis not present

## 2020-12-08 DIAGNOSIS — N042 Nephrotic syndrome with diffuse membranous glomerulonephritis: Secondary | ICD-10-CM | POA: Diagnosis not present

## 2020-12-08 DIAGNOSIS — I120 Hypertensive chronic kidney disease with stage 5 chronic kidney disease or end stage renal disease: Secondary | ICD-10-CM | POA: Diagnosis not present

## 2020-12-30 ENCOUNTER — Encounter: Payer: Self-pay | Admitting: *Deleted

## 2021-01-26 ENCOUNTER — Other Ambulatory Visit: Payer: Self-pay

## 2021-01-26 DIAGNOSIS — N289 Disorder of kidney and ureter, unspecified: Secondary | ICD-10-CM

## 2021-01-27 DIAGNOSIS — K8070 Calculus of gallbladder and bile duct without cholecystitis without obstruction: Secondary | ICD-10-CM | POA: Diagnosis not present

## 2021-02-05 DIAGNOSIS — E213 Hyperparathyroidism, unspecified: Secondary | ICD-10-CM | POA: Diagnosis not present

## 2021-02-05 DIAGNOSIS — M8580 Other specified disorders of bone density and structure, unspecified site: Secondary | ICD-10-CM | POA: Diagnosis not present

## 2021-02-05 DIAGNOSIS — E559 Vitamin D deficiency, unspecified: Secondary | ICD-10-CM | POA: Diagnosis not present

## 2021-02-05 DIAGNOSIS — N185 Chronic kidney disease, stage 5: Secondary | ICD-10-CM | POA: Diagnosis not present

## 2021-02-05 DIAGNOSIS — R7303 Prediabetes: Secondary | ICD-10-CM | POA: Diagnosis not present

## 2021-02-11 NOTE — Progress Notes (Signed)
Office Note     CC:  Chronic Kidney Disease Requesting Provider:  Seward Carol, MD  HPI: Karen Myers is a Right handed 76 y.o. (1945/08/24) female with kidney disease who presents at the request of Seward Carol, MD for permanent HD access. The patient has had no prior access procedures. No lines, no current access. Currently CKD5 based on GFR.  She is currently undergoing work-up at [facility] for [facility]. Patient had breast cancer over 17 years ago, with chemotherapy causing kidney injury.  Over the last several months, she has progressed to chronic kidney disease stage V.  On exam, Belenda was doing well.  She had no complaints.  We discussed the role of hemodialysis as well as the steps of fistula creation and maturation.  The pt is  on a statin for cholesterol management.  The pt is  on a daily aspirin.   Other AC:  - The pt is  on medications for hypertension.   The pt is not diabetic. Tobacco hx:  -  Past Medical History:  Diagnosis Date   Arthritis    Breast cancer ([place])    left breast cancer    Breast cancer ([place])    Chronic kidney disease    CIN III (cervical intraepithelial neoplasia III)    prior to LEEP procedure   GERD (gastroesophageal reflux disease)    High cholesterol    Hypertension     Past Surgical History:  Procedure Laterality Date   BREAST LUMPECTOMY     CERVICAL BIOPSY  W/ LOOP ELECTRODE EXCISION  2005   COLONOSCOPY     HYSTEROSCOPY WITH D & C  2005   MASTECTOMY  2002   bilateral    PARATHYROIDECTOMY N/A 06/01/2017   Procedure: PARATHYROIDECTOMY;  Surgeon: Armandina Gemma, MD;  Location: [facility];  Service: General;  Laterality: N/A;   PARATHYROIDECTOMY N/A 12/07/2018   Procedure: NECK EXPLORATION, RIGHT INFERIOR PARATHYROIDECTOMY, LEFT THYROID LOBECTOMY;  Surgeon: Armandina Gemma, MD;  Location: [facility];  Service: General;  Laterality: N/A;   POLYPECTOMY     RENAL BIOPSY     VULVA Milagros Loll BIOPSY     nevus on vulva      Social History   Socioeconomic History   Marital status: Married    Spouse name: Not on file   Number of children: Not on file   Years of education: Not on file   Highest education level: Not on file  Occupational History   Not on file  Tobacco Use   Smoking status: Former   Smokeless tobacco: Never  Vaping Use   Vaping Use: Never used  Substance and Sexual Activity   Alcohol use: No   Drug use: No   Sexual activity: Yes  Other Topics Concern   Not on file  Social History Narrative   Not on file   Social Determinants of Health   Financial Resource Strain: Not on file  Food Insecurity: Not on file  Transportation Needs: Not on file  Physical Activity: Not on file  Stress: Not on file  Social Connections: Not on file  Intimate Partner Violence: Not on file   No family history on file.  Current Outpatient Medications  Medication Sig Dispense Refill   acetaminophen (TYLENOL) 500 MG tablet Take 1,000 mg by mouth 2 (two) times daily.     aspirin EC 81 MG tablet Take 81 mg by mouth daily.     atorvastatin (LIPITOR) 40 MG tablet Take 40 mg by mouth daily.  diclofenac Sodium (VOLTAREN) 1 % GEL Apply 2 g topically 4 (four) times daily. 100 g 0   diltiazem (CARDIZEM CD) 240 MG 24 hr capsule Take 240 mg by mouth daily.  3   No current facility-administered medications for this visit.    Allergies  Allergen Reactions   Altace [Ramipril] Swelling    Swelling of lips, hypotension   Losartan Other (See Comments)     REVIEW OF SYSTEMS:   [X]  denotes positive finding, [ ]  denotes negative finding Cardiac  Comments:  Chest pain or chest pressure:    Shortness of breath upon exertion:    Short of breath when lying flat:    Irregular heart rhythm:        Vascular    Pain in calf, thigh, or hip brought on by ambulation:    Pain in feet at night that wakes you up from your sleep:     Blood clot in your veins:    Leg swelling:         Pulmonary    Oxygen at  home:    Productive cough:     Wheezing:         Neurologic    Sudden weakness in arms or legs:     Sudden numbness in arms or legs:     Sudden onset of difficulty speaking or slurred speech:    Temporary loss of vision in one eye:     Problems with dizziness:         Gastrointestinal    Blood in stool:     Vomited blood:         Genitourinary    Burning when urinating:     Blood in urine:        Psychiatric    Major depression:         Hematologic    Bleeding problems:    Problems with blood clotting too easily:        Skin    Rashes or ulcers:        Constitutional    Fever or chills:      PHYSICAL EXAMINATION:  There were no vitals filed for this visit.  General:  WDWN in NAD; vital signs documented above Gait: Not observed HENT: WNL, normocephalic Pulmonary: normal non-labored breathing , without Rales, rhonchi,  wheezing Cardiac: regular HR,  Abdomen: soft, NT, no masses Skin: without rashes Vascular Exam/Pulses:  Right Left  Radial 2+ (normal) 2+ (normal)  Ulnar 2+ (normal) 2+ (normal)                   Extremities: without ischemic changes, without Gangrene , without cellulitis; without open wounds;  Musculoskeletal: no muscle wasting or atrophy  Neurologic: A&O X 3;  No focal weakness or paresthesias are detected Psychiatric:  The pt has Normal affect.   Non-Invasive Vascular Imaging:    Right Cephalic    Diameter (cm) Depth (cm) Findings   +-----------------+-------------+----------+--------+   Shoulder              0.09                            +-----------------+-------------+----------+--------+   Prox upper arm        0.08                            +-----------------+-------------+----------+--------+   Mid upper arm  0.11                            +-----------------+-------------+----------+--------+   Dist upper arm        0.11                            +-----------------+-------------+----------+--------+   Antecubital  fossa     0.36                            +-----------------+-------------+----------+--------+   Prox forearm          0.45                            +-----------------+-------------+----------+--------+   Mid forearm           0.38                            +-----------------+-------------+----------+--------+   Dist forearm          0.35                            +-----------------+-------------+----------+--------+   +-----------------+-------------+----------+--------+   Right Basilic     Diameter (cm) Depth (cm) Findings   +-----------------+-------------+----------+--------+   Prox upper arm        0.62                            +-----------------+-------------+----------+--------+   Mid upper arm         0.69                            +-----------------+-------------+----------+--------+   Dist upper arm        0.63                            +-----------------+-------------+----------+--------+   Antecubital fossa     0.51                            +-----------------+-------------+----------+--------+   Prox forearm          0.39                            +-----------------+-------------+----------+--------+   +-----------------+-------------+----------+--------+   Left Cephalic     Diameter (cm) Depth (cm) Findings   +-----------------+-------------+----------+--------+   Shoulder              0.15                            +-----------------+-------------+----------+--------+   Prox upper arm        0.22                            +-----------------+-------------+----------+--------+   Mid upper arm         0.25                            +-----------------+-------------+----------+--------+  Dist upper arm        0.21                            +-----------------+-------------+----------+--------+   Antecubital fossa     0.21                            +-----------------+-------------+----------+--------+   Prox forearm          0.33                             +-----------------+-------------+----------+--------+   Mid forearm           0.32                            +-----------------+-------------+----------+--------+   Dist forearm          0.46                            +-----------------+-------------+----------+--------+   +-----------------+-------------+----------+--------+   Left Basilic      Diameter (cm) Depth (cm) Findings   +-----------------+-------------+----------+--------+   Prox upper arm        0.64                            +-----------------+-------------+----------+--------+   Mid upper arm         0.53                            +-----------------+-------------+----------+--------+   Dist upper arm        0.57                            +-----------------+-------------+----------+--------+   Antecubital fossa     0.60                            +-----------------+-------------+----------+--------+   Prox forearm          0.54                            +-----------------+-------------+----------+--------+   ASSESSMENT/PLAN:  MALAINA MORTELLARO is a 76 y.o. female who presents with chronic kidney disease stage 5  Based on vein mapping and examination, patient has suitable vein for brachiobasilic fistula bilaterally [place]onna would be best served with staged, left-sided brachiobasilic fistula. I had an extensive discussion with this patient in regards to the nature of access surgery, including risk, benefits, and alternatives.   The patient is aware that the risks of access surgery include but are not limited to: bleeding, infection, steal syndrome, nerve damage, ischemic monomelic neuropathy, failure of access to mature, complications related to venous hypertension, and possible need for additional access procedures in the future. I discussed with the patient the nature of the staged access procedure, specifically the need for a second operation to transpose the first stage fistula.  [place] was not ready to  proceed with surgery.  She asked that it be placed on hold until after Easter, as she is going to see her birth father in [place], who she  was recently reunited with after 22 years. I advised her that the total time needed for a staged brachiobasilic fistula is roughly 3 months due to needing two operations.  I asked that she consider undergoing the first stage soon as the maturation of the fistula takes 6+ weeks. Naylin is aware that should she decide to wait, this could result in needing a tunneled dialysis catheter for dialysis prior to fistula maturation. Ellason stated she would call our office with her decision moving forward.  Broadus John, MD [facility] [phone]

## 2021-02-12 ENCOUNTER — Ambulatory Visit (INDEPENDENT_AMBULATORY_CARE_PROVIDER_SITE_OTHER)
Admission: RE | Admit: 2021-02-12 | Discharge: 2021-02-12 | Disposition: A | Discharge status: Home / Self Care | Payer: Medicare Other | Source: Ambulatory Visit | Attending: Vascular Surgery | Admitting: Vascular Surgery

## 2021-02-12 ENCOUNTER — Ambulatory Visit (HOSPITAL_COMMUNITY)
Admission: RE | Admit: 2021-02-12 | Discharge: 2021-02-12 | Disposition: A | Discharge status: Home / Self Care | Payer: Medicare Other | Source: Ambulatory Visit | Attending: Vascular Surgery | Admitting: Vascular Surgery

## 2021-02-12 ENCOUNTER — Encounter: Payer: Self-pay | Admitting: Vascular Surgery

## 2021-02-12 ENCOUNTER — Other Ambulatory Visit: Payer: Self-pay

## 2021-02-12 ENCOUNTER — Ambulatory Visit (INDEPENDENT_AMBULATORY_CARE_PROVIDER_SITE_OTHER): Payer: Medicare Other | Admitting: Vascular Surgery

## 2021-02-12 VITALS — BP 141/84 | HR 74 | Temp 98.1°F | Resp 20 | Ht 67.5 in | Wt 180.0 lb

## 2021-02-12 DIAGNOSIS — N185 Chronic kidney disease, stage 5: Secondary | ICD-10-CM | POA: Diagnosis not present

## 2021-02-12 DIAGNOSIS — N289 Disorder of kidney and ureter, unspecified: Secondary | ICD-10-CM

## 2021-02-15 DIAGNOSIS — Z8719 Personal history of other diseases of the digestive system: Secondary | ICD-10-CM | POA: Diagnosis not present

## 2021-02-15 DIAGNOSIS — R933 Abnormal findings on diagnostic imaging of other parts of digestive tract: Secondary | ICD-10-CM | POA: Diagnosis not present

## 2021-02-15 DIAGNOSIS — K2950 Unspecified chronic gastritis without bleeding: Secondary | ICD-10-CM | POA: Diagnosis not present

## 2021-02-15 DIAGNOSIS — K8051 Calculus of bile duct without cholangitis or cholecystitis with obstruction: Secondary | ICD-10-CM | POA: Diagnosis not present

## 2021-02-15 DIAGNOSIS — K8050 Calculus of bile duct without cholangitis or cholecystitis without obstruction: Secondary | ICD-10-CM | POA: Diagnosis not present

## 2021-02-15 DIAGNOSIS — K259 Gastric ulcer, unspecified as acute or chronic, without hemorrhage or perforation: Secondary | ICD-10-CM | POA: Diagnosis not present

## 2021-02-17 DIAGNOSIS — N185 Chronic kidney disease, stage 5: Secondary | ICD-10-CM | POA: Diagnosis not present

## 2021-02-17 DIAGNOSIS — E213 Hyperparathyroidism, unspecified: Secondary | ICD-10-CM | POA: Diagnosis not present

## 2021-02-17 DIAGNOSIS — D631 Anemia in chronic kidney disease: Secondary | ICD-10-CM | POA: Diagnosis not present

## 2021-02-17 DIAGNOSIS — I120 Hypertensive chronic kidney disease with stage 5 chronic kidney disease or end stage renal disease: Secondary | ICD-10-CM | POA: Diagnosis not present

## 2021-02-17 DIAGNOSIS — N042 Nephrotic syndrome with diffuse membranous glomerulonephritis: Secondary | ICD-10-CM | POA: Diagnosis not present

## 2021-02-24 DIAGNOSIS — Z8719 Personal history of other diseases of the digestive system: Secondary | ICD-10-CM | POA: Diagnosis not present

## 2021-02-24 DIAGNOSIS — N189 Chronic kidney disease, unspecified: Secondary | ICD-10-CM | POA: Diagnosis not present

## 2021-02-24 DIAGNOSIS — K8050 Calculus of bile duct without cholangitis or cholecystitis without obstruction: Secondary | ICD-10-CM | POA: Diagnosis not present

## 2021-03-17 DIAGNOSIS — I4510 Unspecified right bundle-branch block: Secondary | ICD-10-CM | POA: Diagnosis not present

## 2021-03-17 DIAGNOSIS — I4519 Other right bundle-branch block: Secondary | ICD-10-CM | POA: Diagnosis not present

## 2021-03-17 DIAGNOSIS — K8050 Calculus of bile duct without cholangitis or cholecystitis without obstruction: Secondary | ICD-10-CM | POA: Diagnosis not present

## 2021-03-17 DIAGNOSIS — Z01810 Encounter for preprocedural cardiovascular examination: Secondary | ICD-10-CM | POA: Diagnosis not present

## 2021-03-17 DIAGNOSIS — D649 Anemia, unspecified: Secondary | ICD-10-CM | POA: Diagnosis not present

## 2021-03-17 DIAGNOSIS — Z01812 Encounter for preprocedural laboratory examination: Secondary | ICD-10-CM | POA: Diagnosis not present

## 2021-03-18 ENCOUNTER — Other Ambulatory Visit: Payer: Self-pay

## 2021-03-18 ENCOUNTER — Emergency Department (HOSPITAL_COMMUNITY): Payer: Medicare Other | Admitting: General Practice

## 2021-03-18 ENCOUNTER — Encounter (HOSPITAL_COMMUNITY)
Admission: EM | Disposition: A | Discharge status: Home / Self Care | Payer: Self-pay | Source: Home / Self Care | Attending: Emergency Medicine

## 2021-03-18 ENCOUNTER — Encounter (HOSPITAL_COMMUNITY): Payer: Self-pay | Admitting: General Practice

## 2021-03-18 ENCOUNTER — Emergency Department (HOSPITAL_COMMUNITY): Payer: Medicare Other

## 2021-03-18 ENCOUNTER — Ambulatory Visit (HOSPITAL_COMMUNITY)
Admission: EM | Admit: 2021-03-18 | Discharge: 2021-03-18 | Disposition: A | Discharge status: Home / Self Care | Payer: Medicare Other | Attending: Emergency Medicine | Admitting: Emergency Medicine

## 2021-03-18 ENCOUNTER — Emergency Department (EMERGENCY_DEPARTMENT_HOSPITAL): Payer: Medicare Other | Admitting: General Practice

## 2021-03-18 DIAGNOSIS — R079 Chest pain, unspecified: Secondary | ICD-10-CM

## 2021-03-18 DIAGNOSIS — K82A1 Gangrene of gallbladder in cholecystitis: Secondary | ICD-10-CM | POA: Diagnosis not present

## 2021-03-18 DIAGNOSIS — Z87891 Personal history of nicotine dependence: Secondary | ICD-10-CM | POA: Insufficient documentation

## 2021-03-18 DIAGNOSIS — Z20822 Contact with and (suspected) exposure to covid-19: Secondary | ICD-10-CM | POA: Diagnosis not present

## 2021-03-18 DIAGNOSIS — K812 Acute cholecystitis with chronic cholecystitis: Secondary | ICD-10-CM

## 2021-03-18 DIAGNOSIS — K219 Gastro-esophageal reflux disease without esophagitis: Secondary | ICD-10-CM

## 2021-03-18 DIAGNOSIS — N189 Chronic kidney disease, unspecified: Secondary | ICD-10-CM | POA: Insufficient documentation

## 2021-03-18 DIAGNOSIS — I129 Hypertensive chronic kidney disease with stage 1 through stage 4 chronic kidney disease, or unspecified chronic kidney disease: Secondary | ICD-10-CM | POA: Diagnosis not present

## 2021-03-18 DIAGNOSIS — I10 Essential (primary) hypertension: Secondary | ICD-10-CM | POA: Diagnosis not present

## 2021-03-18 DIAGNOSIS — K819 Cholecystitis, unspecified: Secondary | ICD-10-CM

## 2021-03-18 DIAGNOSIS — R109 Unspecified abdominal pain: Secondary | ICD-10-CM

## 2021-03-18 DIAGNOSIS — K821 Hydrops of gallbladder: Secondary | ICD-10-CM

## 2021-03-18 DIAGNOSIS — E213 Hyperparathyroidism, unspecified: Secondary | ICD-10-CM | POA: Insufficient documentation

## 2021-03-18 DIAGNOSIS — K810 Acute cholecystitis: Secondary | ICD-10-CM | POA: Insufficient documentation

## 2021-03-18 DIAGNOSIS — K8020 Calculus of gallbladder without cholecystitis without obstruction: Secondary | ICD-10-CM

## 2021-03-18 DIAGNOSIS — Z7682 Awaiting organ transplant status: Secondary | ICD-10-CM | POA: Insufficient documentation

## 2021-03-18 DIAGNOSIS — K8000 Calculus of gallbladder with acute cholecystitis without obstruction: Secondary | ICD-10-CM | POA: Diagnosis not present

## 2021-03-18 DIAGNOSIS — R0789 Other chest pain: Secondary | ICD-10-CM | POA: Diagnosis not present

## 2021-03-18 DIAGNOSIS — R1011 Right upper quadrant pain: Secondary | ICD-10-CM | POA: Diagnosis not present

## 2021-03-18 DIAGNOSIS — R000 Tachycardia, unspecified: Secondary | ICD-10-CM | POA: Diagnosis not present

## 2021-03-18 DIAGNOSIS — K3580 Unspecified acute appendicitis: Secondary | ICD-10-CM | POA: Diagnosis not present

## 2021-03-18 DIAGNOSIS — M1990 Unspecified osteoarthritis, unspecified site: Secondary | ICD-10-CM | POA: Diagnosis not present

## 2021-03-18 HISTORY — PX: CHOLECYSTECTOMY: SHX55

## 2021-03-18 LAB — COMPREHENSIVE METABOLIC PANEL
ALT: 11 U/L (ref 0–44)
AST: 15 U/L (ref 15–41)
Albumin: 3.8 g/dL (ref 3.5–5.0)
Alkaline Phosphatase: 154 U/L — ABNORMAL HIGH (ref 38–126)
Anion gap: 12 (ref 5–15)
BUN: 42 mg/dL — ABNORMAL HIGH (ref 8–23)
CO2: 17 mmol/L — ABNORMAL LOW (ref 22–32)
Calcium: 10.4 mg/dL — ABNORMAL HIGH (ref 8.9–10.3)
Chloride: 105 mmol/L (ref 98–111)
Creatinine, Ser: 3.5 mg/dL — ABNORMAL HIGH (ref 0.44–1.00)
GFR, Estimated: 13 mL/min — ABNORMAL LOW (ref >60)
Glucose, Bld: 132 mg/dL — ABNORMAL HIGH (ref 70–99)
Potassium: 4.4 mmol/L (ref 3.5–5.1)
Sodium: 134 mmol/L — ABNORMAL LOW (ref 135–145)
Total Bilirubin: 0.8 mg/dL (ref 0.3–1.2)
Total Protein: 7 g/dL (ref 6.5–8.1)

## 2021-03-18 LAB — LIPID PANEL
Cholesterol: 177 mg/dL (ref 0–200)
HDL: 45 mg/dL (ref >40)
LDL Cholesterol: 100 mg/dL — ABNORMAL HIGH (ref 0–99)
Total CHOL/HDL Ratio: 3.9 RATIO
Triglycerides: 161 mg/dL — ABNORMAL HIGH (ref <150)
VLDL: 32 mg/dL (ref 0–40)

## 2021-03-18 LAB — CBC WITH DIFFERENTIAL/PLATELET
Abs Immature Granulocytes: 0.06 10*3/uL (ref 0.00–0.07)
Basophils Absolute: 0 10*3/uL (ref 0.0–0.1)
Basophils Relative: 0 %
Eosinophils Absolute: 0 10*3/uL (ref 0.0–0.5)
Eosinophils Relative: 0 %
HCT: 38 % (ref 36.0–46.0)
Hemoglobin: 12 g/dL (ref 12.0–15.0)
Immature Granulocytes: 0 %
Lymphocytes Relative: 8 %
Lymphs Abs: 1.1 10*3/uL (ref 0.7–4.0)
MCH: 29.2 pg (ref 26.0–34.0)
MCHC: 31.6 g/dL (ref 30.0–36.0)
MCV: 92.5 fL (ref 80.0–100.0)
Monocytes Absolute: 1.4 10*3/uL — ABNORMAL HIGH (ref 0.1–1.0)
Monocytes Relative: 11 %
Neutro Abs: 10.8 10*3/uL — ABNORMAL HIGH (ref 1.7–7.7)
Neutrophils Relative %: 81 %
Platelets: 283 10*3/uL (ref 150–400)
RBC: 4.11 MIL/uL (ref 3.87–5.11)
RDW: 14.2 % (ref 11.5–15.5)
WBC: 13.5 10*3/uL — ABNORMAL HIGH (ref 4.0–10.5)
nRBC: 0 % (ref 0.0–0.2)

## 2021-03-18 LAB — RESP PANEL BY RT-PCR (FLU A&B, COVID) ARPGX2
Influenza A by PCR: NEGATIVE
Influenza B by PCR: NEGATIVE
SARS Coronavirus 2 by RT PCR: NEGATIVE

## 2021-03-18 LAB — TROPONIN I (HIGH SENSITIVITY): Troponin I (High Sensitivity): 17 ng/L (ref <18)

## 2021-03-18 LAB — LIPASE, BLOOD: Lipase: 30 U/L (ref 11–51)

## 2021-03-18 SURGERY — LAPAROSCOPIC CHOLECYSTECTOMY WITH INTRAOPERATIVE CHOLANGIOGRAM
Anesthesia: General | Site: Abdomen

## 2021-03-18 MED ORDER — PHENYLEPHRINE 40 MCG/ML (10ML) SYRINGE FOR IV PUSH (FOR BLOOD PRESSURE SUPPORT)
PREFILLED_SYRINGE | INTRAVENOUS | Status: DC | PRN
  Administered 2021-03-18 (×2): 80 ug via INTRAVENOUS

## 2021-03-18 MED ORDER — DEXAMETHASONE SODIUM PHOSPHATE 10 MG/ML IJ SOLN
INTRAMUSCULAR | Status: AC
  Filled 2021-03-18: qty 3

## 2021-03-18 MED ORDER — DEXMEDETOMIDINE (PRECEDEX) IN NS 20 MCG/5ML (4 MCG/ML) IV SYRINGE
PREFILLED_SYRINGE | INTRAVENOUS | Status: AC
  Filled 2021-03-18: qty 5

## 2021-03-18 MED ORDER — SODIUM CHLORIDE 0.9 % IV SOLN
2.0000 g | INTRAVENOUS | Status: AC
  Administered 2021-03-18: 2 g via INTRAVENOUS
  Filled 2021-03-18: qty 20

## 2021-03-18 MED ORDER — BUPIVACAINE-EPINEPHRINE (PF) 0.25% -1:200000 IJ SOLN
INTRAMUSCULAR | Status: AC
  Filled 2021-03-18: qty 30

## 2021-03-18 MED ORDER — ONDANSETRON HCL 4 MG/2ML IJ SOLN
INTRAMUSCULAR | Status: AC
  Filled 2021-03-18: qty 2

## 2021-03-18 MED ORDER — CHLORHEXIDINE GLUCONATE 0.12 % MT SOLN
15.0000 mL | Freq: Once | OROMUCOSAL | Status: AC
  Administered 2021-03-18: 15 mL via OROMUCOSAL
  Filled 2021-03-18: qty 15

## 2021-03-18 MED ORDER — AMISULPRIDE (ANTIEMETIC) 5 MG/2ML IV SOLN: 10.0000 mg | Freq: Once | INTRAVENOUS | Status: DC | PRN

## 2021-03-18 MED ORDER — PHENYLEPHRINE 40 MCG/ML (10ML) SYRINGE FOR IV PUSH (FOR BLOOD PRESSURE SUPPORT)
PREFILLED_SYRINGE | INTRAVENOUS | Status: AC
  Filled 2021-03-18: qty 20

## 2021-03-18 MED ORDER — PROPOFOL 10 MG/ML IV BOLUS
INTRAVENOUS | Status: AC
  Filled 2021-03-18: qty 20

## 2021-03-18 MED ORDER — HYDROMORPHONE HCL 1 MG/ML IJ SOLN: 0.2500 mg | INTRAMUSCULAR | Status: DC | PRN

## 2021-03-18 MED ORDER — PHENYLEPHRINE HCL-NACL 20-0.9 MG/250ML-% IV SOLN
INTRAVENOUS | Status: DC | PRN
Start: 2021-03-18 — End: 2021-03-18
  Administered 2021-03-18: 25 ug/min via INTRAVENOUS

## 2021-03-18 MED ORDER — DOCUSATE SODIUM 100 MG PO CAPS: 100.0000 mg | ORAL_CAPSULE | Freq: Every day | ORAL | 2 refills | Status: AC | PRN

## 2021-03-18 MED ORDER — ROCURONIUM BROMIDE 10 MG/ML (PF) SYRINGE
PREFILLED_SYRINGE | INTRAVENOUS | Status: DC | PRN
Start: 2021-03-18 — End: 2021-03-18
  Administered 2021-03-18: 80 mg via INTRAVENOUS

## 2021-03-18 MED ORDER — OXYCODONE HCL 5 MG PO TABS
5.0000 mg | ORAL_TABLET | Freq: Four times a day (QID) | ORAL | 0 refills | Status: AC | PRN
Start: 2021-03-18 — End: 2021-03-23

## 2021-03-18 MED ORDER — ORAL CARE MOUTH RINSE: 15.0000 mL | Freq: Once | OROMUCOSAL | Status: AC

## 2021-03-18 MED ORDER — PROPOFOL 10 MG/ML IV BOLUS
INTRAVENOUS | Status: DC | PRN
Start: 2021-03-18 — End: 2021-03-18
  Administered 2021-03-18: 130 mg via INTRAVENOUS

## 2021-03-18 MED ORDER — OXYCODONE HCL 5 MG/5ML PO SOLN: 5.0000 mg | Freq: Once | ORAL | Status: DC | PRN

## 2021-03-18 MED ORDER — ROCURONIUM BROMIDE 10 MG/ML (PF) SYRINGE
PREFILLED_SYRINGE | INTRAVENOUS | Status: AC
  Filled 2021-03-18: qty 10

## 2021-03-18 MED ORDER — DEXAMETHASONE SODIUM PHOSPHATE 10 MG/ML IJ SOLN
INTRAMUSCULAR | Status: AC
  Filled 2021-03-18: qty 1

## 2021-03-18 MED ORDER — DEXAMETHASONE SODIUM PHOSPHATE 10 MG/ML IJ SOLN
INTRAMUSCULAR | Status: DC | PRN
  Administered 2021-03-18: 10 mg via INTRAVENOUS

## 2021-03-18 MED ORDER — OXYCODONE HCL 5 MG PO TABS: 5.0000 mg | ORAL_TABLET | Freq: Once | ORAL | Status: DC | PRN

## 2021-03-18 MED ORDER — 0.9 % SODIUM CHLORIDE (POUR BTL) OPTIME
TOPICAL | Status: DC | PRN
  Administered 2021-03-18: 1000 mL

## 2021-03-18 MED ORDER — PANTOPRAZOLE SODIUM 40 MG IV SOLR
40.0000 mg | Freq: Once | INTRAVENOUS | Status: AC
  Administered 2021-03-18: 40 mg via INTRAVENOUS
  Filled 2021-03-18: qty 10

## 2021-03-18 MED ORDER — PROMETHAZINE HCL 25 MG/ML IJ SOLN: 6.2500 mg | INTRAMUSCULAR | Status: DC | PRN

## 2021-03-18 MED ORDER — BUPIVACAINE-EPINEPHRINE 0.25% -1:200000 IJ SOLN
INTRAMUSCULAR | Status: DC | PRN
  Administered 2021-03-18: 18 mL

## 2021-03-18 MED ORDER — SODIUM CHLORIDE 0.9 % IV SOLN: INTRAVENOUS | Status: DC

## 2021-03-18 MED ORDER — LABETALOL HCL 5 MG/ML IV SOLN
INTRAVENOUS | Status: AC
  Filled 2021-03-18: qty 4

## 2021-03-18 MED ORDER — HEMOSTATIC AGENTS (NO CHARGE) OPTIME
TOPICAL | Status: DC | PRN
  Administered 2021-03-18: 1 via TOPICAL

## 2021-03-18 MED ORDER — FENTANYL CITRATE PF 50 MCG/ML IJ SOSY
50.0000 ug | PREFILLED_SYRINGE | Freq: Once | INTRAMUSCULAR | Status: AC
  Administered 2021-03-18: 50 ug via INTRAVENOUS
  Filled 2021-03-18: qty 1

## 2021-03-18 MED ORDER — SODIUM CHLORIDE 0.9 % IR SOLN
Status: DC | PRN
  Administered 2021-03-18: 1000 mL

## 2021-03-18 MED ORDER — LIDOCAINE 2% (20 MG/ML) 5 ML SYRINGE
INTRAMUSCULAR | Status: DC | PRN
Start: 2021-03-18 — End: 2021-03-18
  Administered 2021-03-18: 80 mg via INTRAVENOUS

## 2021-03-18 MED ORDER — FENTANYL CITRATE (PF) 250 MCG/5ML IJ SOLN
INTRAMUSCULAR | Status: AC
  Filled 2021-03-18: qty 5

## 2021-03-18 MED ORDER — FENTANYL CITRATE (PF) 250 MCG/5ML IJ SOLN
INTRAMUSCULAR | Status: DC | PRN
  Administered 2021-03-18: 50 ug via INTRAVENOUS
  Administered 2021-03-18: 100 ug via INTRAVENOUS

## 2021-03-18 MED ORDER — ONDANSETRON HCL 4 MG/2ML IJ SOLN
INTRAMUSCULAR | Status: AC
  Filled 2021-03-18: qty 4

## 2021-03-18 MED ORDER — SUCCINYLCHOLINE CHLORIDE 200 MG/10ML IV SOSY
PREFILLED_SYRINGE | INTRAVENOUS | Status: AC
  Filled 2021-03-18: qty 10

## 2021-03-18 MED ORDER — INDOCYANINE GREEN 25 MG IV SOLR
INTRAVENOUS | Status: DC | PRN
  Administered 2021-03-18: 2.5 mg via INTRAVENOUS

## 2021-03-18 MED ORDER — ONDANSETRON HCL 4 MG/2ML IJ SOLN
INTRAMUSCULAR | Status: DC | PRN
  Administered 2021-03-18: 4 mg via INTRAVENOUS

## 2021-03-18 MED ORDER — SUGAMMADEX SODIUM 200 MG/2ML IV SOLN
INTRAVENOUS | Status: DC | PRN
  Administered 2021-03-18: 200 mg via INTRAVENOUS

## 2021-03-18 MED ORDER — LIDOCAINE 2% (20 MG/ML) 5 ML SYRINGE
INTRAMUSCULAR | Status: AC
  Filled 2021-03-18: qty 5

## 2021-03-18 MED ORDER — LABETALOL HCL 5 MG/ML IV SOLN
10.0000 mg | Freq: Once | INTRAVENOUS | Status: AC
  Administered 2021-03-18: 10 mg via INTRAVENOUS

## 2021-03-18 SURGICAL SUPPLY — 42 items
ADH SKN CLS APL DERMABOND .7 (GAUZE/BANDAGES/DRESSINGS) ×1
APL PRP STRL LF DISP 70% ISPRP (MISCELLANEOUS) ×1
APPLIER CLIP 5 13 M/L LIGAMAX5 (MISCELLANEOUS) ×2
APR CLP MED LRG 5 ANG JAW (MISCELLANEOUS) ×1
BAG COUNTER SPONGE SURGICOUNT (BAG) ×3 IMPLANT
BAG SPEC RTRVL LRG 6X4 10 (ENDOMECHANICALS) ×1
BAG SPNG CNTER NS LX DISP (BAG) ×1
BLADE CLIPPER SURG (BLADE) IMPLANT
CANISTER SUCT 3000ML PPV (MISCELLANEOUS) ×3 IMPLANT
CHLORAPREP W/TINT 26 (MISCELLANEOUS) ×3 IMPLANT
CLIP APPLIE 5 13 M/L LIGAMAX5 (MISCELLANEOUS) ×2 IMPLANT
COVER MAYO STAND STRL (DRAPES) ×2 IMPLANT
COVER SURGICAL LIGHT HANDLE (MISCELLANEOUS) ×3 IMPLANT
DERMABOND ADVANCED (GAUZE/BANDAGES/DRESSINGS) ×1
DERMABOND ADVANCED .7 DNX12 (GAUZE/BANDAGES/DRESSINGS) ×2 IMPLANT
DISSECTOR BLUNT TIP ENDO 5MM (MISCELLANEOUS) IMPLANT
DRAPE C-ARM 42X120 X-RAY (DRAPES) ×2 IMPLANT
ELECT REM PT RETURN 9FT ADLT (ELECTROSURGICAL) ×2
ELECTRODE REM PT RTRN 9FT ADLT (ELECTROSURGICAL) ×2 IMPLANT
GLOVE SURG ENC MOIS LTX SZ7.5 (GLOVE) ×3 IMPLANT
GLOVE SURG UNDER LTX SZ8 (GLOVE) ×3 IMPLANT
GOWN STRL REUS W/ TWL LRG LVL3 (GOWN DISPOSABLE) ×4 IMPLANT
GOWN STRL REUS W/ TWL XL LVL3 (GOWN DISPOSABLE) ×2 IMPLANT
GOWN STRL REUS W/TWL LRG LVL3 (GOWN DISPOSABLE) ×4
GOWN STRL REUS W/TWL XL LVL3 (GOWN DISPOSABLE) ×2
KIT BASIN OR (CUSTOM PROCEDURE TRAY) ×3 IMPLANT
KIT TURNOVER KIT B (KITS) ×3 IMPLANT
NS IRRIG 1000ML POUR BTL (IV SOLUTION) ×3 IMPLANT
PAD ARMBOARD 7.5X6 YLW CONV (MISCELLANEOUS) ×3 IMPLANT
PENCIL BUTTON HOLSTER BLD 10FT (ELECTRODE) ×3 IMPLANT
POUCH SPECIMEN RETRIEVAL 10MM (ENDOMECHANICALS) ×1 IMPLANT
SCISSORS LAP 5X35 DISP (ENDOMECHANICALS) ×3 IMPLANT
SET CHOLANGIOGRAPH 5 50 .035 (SET/KITS/TRAYS/PACK) ×3 IMPLANT
SET IRRIG TUBING LAPAROSCOPIC (IRRIGATION / IRRIGATOR) ×3 IMPLANT
SET TUBE SMOKE EVAC HIGH FLOW (TUBING) ×2 IMPLANT
SUT MNCRL AB 4-0 PS2 18 (SUTURE) ×3 IMPLANT
TOWEL GREEN STERILE (TOWEL DISPOSABLE) ×3 IMPLANT
TOWEL GREEN STERILE FF (TOWEL DISPOSABLE) ×3 IMPLANT
TRAY LAPAROSCOPIC MC (CUSTOM PROCEDURE TRAY) ×3 IMPLANT
TROCAR ADV FIXATION 5X100MM (TROCAR) ×9 IMPLANT
TROCAR XCEL BLUNT TIP 100MML (ENDOMECHANICALS) ×3 IMPLANT
WATER STERILE IRR 1000ML POUR (IV SOLUTION) ×2 IMPLANT

## 2021-03-18 NOTE — H&P (Signed)
History and Physical  Karen Myers April 29, 1945  [id number].    Requesting MD: Dr. Tamera Punt Chief Complaint/Reason for Consult: acute cholecystitis  HPI:  76 yo female with medical history of CKD, GERD, HTN, residual hyperparathyroidism s/p parathyroidectomy and thyroid lobectomy who presented to [facility] ED with lower chest/epigastric abdominal pain. She described the pain as similar to when she had ERCP and stone removal at [facility] recently. She was scheduled for cholecystectomy at [facility] next week with Dr. Florene Glen at [facility]. Pain began 2 days ago after eating a sandwich and has been constant since. She has had limited PO intake for last two days. Associated nausea, no emesis. Denies fever. No associated sob. Symptoms are not worsened with ambulation/activity. She is unsure if they are worsened by PO intake. ROS otherwise as below  Work up in ED significant for [place] with distended gallbladder and stone at neck with findings consistent with acute cholecystitis. WBC 13.5. Alk phos 154 but LFT's otherwise wnl. Lipase wnl. Creatinine 3.5 (baseline appears above 3.00). Tn wnl.   She is on renal transplant list for CKD Blood thinners: None Past Abdominal Surgeries: None   ROS: Review of Systems  Constitutional:  Negative for chills and fever.  Respiratory:  Negative for cough and shortness of breath.   Cardiovascular:  Negative for palpitations and leg swelling.  Gastrointestinal:  Positive for abdominal pain and nausea. Negative for vomiting.  Genitourinary: Negative.   All other systems reviewed and are negative.  No family history on file.  Past Medical History:  Diagnosis Date   Arthritis    Breast cancer ([place])    left breast cancer    Breast cancer ([place])    Chronic kidney disease    CIN III (cervical intraepithelial neoplasia III)    prior to LEEP procedure   GERD (gastroesophageal reflux disease)    High cholesterol    Hypertension     Past Surgical History:  Procedure  Laterality Date   BREAST LUMPECTOMY     CERVICAL BIOPSY  W/ LOOP ELECTRODE EXCISION  2005   COLONOSCOPY     HYSTEROSCOPY WITH D & C  2005   MASTECTOMY  2002   bilateral    PARATHYROIDECTOMY N/A 06/01/2017   Procedure: PARATHYROIDECTOMY;  Surgeon: Armandina Gemma, MD;  Location: [facility];  Service: General;  Laterality: N/A;   PARATHYROIDECTOMY N/A 12/07/2018   Procedure: NECK EXPLORATION, RIGHT INFERIOR PARATHYROIDECTOMY, LEFT THYROID LOBECTOMY;  Surgeon: Armandina Gemma, MD;  Location: [facility];  Service: General;  Laterality: N/A;   POLYPECTOMY     RENAL BIOPSY     VULVA Milagros Loll BIOPSY     nevus on vulva     Social History:  reports that she has quit smoking. She has never used smokeless tobacco. She reports that she does not drink alcohol and does not use drugs.  Allergies:  Allergies  Allergen Reactions   Altace [Ramipril] Swelling    Swelling of lips, hypotension   Lisinopril     Other reaction(s): angioedema   Losartan Other (See Comments)    (Not in a hospital admission)   Blood pressure (!) 150/107, pulse (!) 103, temperature 98.1 F (36.7 C), temperature source Oral, resp. rate 16, SpO2 100 %. Physical Exam: General: pleasant, WD, female who is laying in bed in NAD HEENT: head is normocephalic, atraumatic.  Sclera are noninjected.  Pupils equal and round. EOMs intact.  Ears and nose without any masses or lesions.  Mouth is pink and moist Heart:  regular, rate, and rhythm.  Normal s1,s2. No obvious murmurs, gallops, or rubs noted.  Palpable radial and pedal pulses bilaterally Lungs: CTAB, no wheezes, rhonchi, or rales noted.  Respiratory effort nonlabored Abd: soft, ND, +BS, no masses, hernias, or organomegaly. Some ttp to the epigastrium and RUQ without rebound or guarding. Otherwise NT MSK: all 4 extremities are symmetrical with no cyanosis, clubbing, or edema. Skin: warm and dry with no masses, lesions, or rashes Neuro: Cranial nerves 2-12 grossly intact, sensation is  normal throughout, MAE's.  Psych: A&Ox3 with an appropriate affect.    Results for orders placed or performed during the hospital encounter of 03/18/21 (from the past 48 hour(s))  CBC with Differential/Platelet     Status: Abnormal   Collection Time: 03/18/21  4:36 AM  Result Value Ref Range   WBC 13.5 (H) 4.0 - 10.5 K/uL   RBC 4.11 3.87 - 5.11 MIL/uL   Hemoglobin 12.0 12.0 - 15.0 g/dL   HCT 38.0 36.0 - 46.0 %   MCV 92.5 80.0 - 100.0 fL   MCH 29.2 26.0 - 34.0 pg   MCHC 31.6 30.0 - 36.0 g/dL   RDW 14.2 11.5 - 15.5 %   Platelets 283 150 - 400 K/uL   nRBC 0.0 0.0 - 0.2 %   Neutrophils Relative % 81 %   Neutro Abs 10.8 (H) 1.7 - 7.7 K/uL   Lymphocytes Relative 8 %   Lymphs Abs 1.1 0.7 - 4.0 K/uL   Monocytes Relative 11 %   Monocytes Absolute 1.4 (H) 0.1 - 1.0 K/uL   Eosinophils Relative 0 %   Eosinophils Absolute 0.0 0.0 - 0.5 K/uL   Basophils Relative 0 %   Basophils Absolute 0.0 0.0 - 0.1 K/uL   Immature Granulocytes 0 %   Abs Immature Granulocytes 0.06 0.00 - 0.07 K/uL    Comment: Performed at [facility], [address], [place], [place] [address]  Comprehensive metabolic panel     Status: Abnormal   Collection Time: 03/18/21  4:36 AM  Result Value Ref Range   Sodium 134 (L) 135 - 145 mmol/L   Potassium 4.4 3.5 - 5.1 mmol/L   Chloride 105 98 - 111 mmol/L   CO2 17 (L) 22 - 32 mmol/L   Glucose, Bld 132 (H) 70 - 99 mg/dL    Comment: Glucose reference range applies only to samples taken after fasting for at least 8 hours.   BUN 42 (H) 8 - 23 mg/dL   Creatinine, Ser 3.50 (H) 0.44 - 1.00 mg/dL   Calcium 10.4 (H) 8.9 - 10.3 mg/dL   Total Protein 7.0 6.5 - 8.1 g/dL   Albumin 3.8 3.5 - 5.0 g/dL   AST 15 15 - 41 U/L   ALT 11 0 - 44 U/L   Alkaline Phosphatase 154 (H) 38 - 126 U/L   Total Bilirubin 0.8 0.3 - 1.2 mg/dL   GFR, Estimated 13 (L) >60 mL/min    Comment: (NOTE) Calculated using the CKD-EPI Creatinine Equation (2021)    Anion gap 12 5 - 15    Comment:  Performed at [facility], [place] [address], [place], [place] [address]  Lipid panel     Status: Abnormal   Collection Time: 03/18/21  4:36 AM  Result Value Ref Range   Cholesterol 177 0 - 200 mg/dL   Triglycerides 161 (H) <150 mg/dL   HDL 45 >40 mg/dL   Total CHOL/HDL Ratio 3.9 RATIO   VLDL 32 0 -  40 mg/dL   LDL Cholesterol 100 (H) 0 - 99 mg/dL    Comment:        Total Cholesterol/HDL:CHD Risk Coronary Heart Disease Risk Table                     Men   Women  1/2 Average Risk   3.4   3.3  Average Risk       5.0   4.4  2 X Average Risk   9.6   7.1  3 X Average Risk  23.4   11.0        Use the calculated Patient Ratio above and the CHD Risk Table to determine the patient's CHD Risk.        ATP III CLASSIFICATION (LDL):  <100     mg/dL   Optimal  100-129  mg/dL   Near or Above                    Optimal  130-159  mg/dL   Borderline  160-189  mg/dL   High  >190     mg/dL   Very High Performed at [facility] [address] [place], [place] [address]   Troponin I (High Sensitivity)     Status: None   Collection Time: 03/18/21  4:36 AM  Result Value Ref Range   Troponin I (High Sensitivity) 17 <18 ng/L    Comment: (NOTE) Elevated high sensitivity troponin I (hsTnI) values and significant  changes across serial measurements may suggest ACS but many other  chronic and acute conditions are known to elevate hsTnI results.  Refer to the "Links" section for chest pain algorithms and additional  guidance. Performed at [facility], [place] [address], [place], [place] [address]   Lipase, blood     Status: None   Collection Time: 03/18/21 10:58 AM  Result Value Ref Range   Lipase 30 11 - 51 U/L    Comment: Performed at [facility] [address], [place], [place] [address]   DG Chest 2 View  Result Date: 03/18/2021 CLINICAL DATA:  Chest pain.  Downtime dictation EXAM: CHEST - 2 VIEW COMPARISON:  07/13/2019 FINDINGS: Normal heart size and stable mediastinal  contours. Postoperative left thoracic inlet. There is no edema, consolidation, effusion, or pneumothorax. IMPRESSION: Stable exam.  No evidence of acute disease. Electronically Signed   By: Jorje Guild M.D.   On: 03/18/2021 07:20   US Abdomen Limited RUQ (LIVER/GB)  Result Date: 03/18/2021 CLINICAL DATA:  Right upper quadrant pain EXAM: ULTRASOUND ABDOMEN LIMITED RIGHT UPPER QUADRANT COMPARISON:  Abdominal CT 06/20/2005 FINDINGS: Gallbladder: Distended gallbladder measuring up to 4 cm in diameter with low-density wall thickening measuring 4 mm. A stone is seen fixed at the neck with shadowing and adjacent sludge. No focal tenderness at time of sonographic exam, but need correlation with pain medication history. Common bile duct: Diameter: 9 mm. No available lab data in the setting of downtime. No biliary dilatation on comparison CT. Liver: Cyst in the upper liver measuring 16 mm. Within normal limits in parenchymal echogenicity. Portal vein is patent on color Doppler imaging with normal direction of blood flow towards the liver. Other: Study performed during downtime with no available worksheet. Prelim discussed with technologist and ordering physician. IMPRESSION: 1. Except for Murphy sign, sonographic findings of acute calculus cholecystitis. Please correlate with pain medication history. 2. 9 mm common bile duct diameter without visible choledocholithiasis. Electronically Signed  By: Jorje Guild M.D.   On: 03/18/2021 07:08    Anti-infectives (From admission, onward)    Start     Dose/Rate Route Frequency Ordered Stop   03/18/21 1315  cefTRIAXone (ROCEPHIN) 2 g in sodium chloride 0.9 % 100 mL IVPB        2 g 200 mL/hr over 30 Minutes Intravenous On call to O.R. 03/18/21 1302 03/19/21 0559        Assessment/Plan Acute calculous cholecystitis - This is a 76 y.o. female with epigastric abdominal pain, recent ERCP for choledocholithiasis, and imaging concerning for acute calculous  cholecystitis. WBC 13.5. Alk phos 154 but LFT's otherwise wnl. Recommend Laparoscopic Cholecystectomy. Seen with my attending who discussed procedure, risks, and aftercare of cholecystectomy. She seems to understand and agrees to proceed.  Possible discharge from PACU. If requires admission post op, would recommend admission to [facility].   FEN: NPO for OR ID: ancef periop  CKD on transplant list, not currently on HD GERD HTN Residual hyperparathyroidism s/p parathyroidectomy and thyroid lobectomy   This care required moderate level of medical decision making.   Alferd Apa, [facility] 03/18/2021, 12:04 PM Please see Amion for pager number during day hours 7:00am-4:30pm

## 2021-03-18 NOTE — Op Note (Signed)
03/18/2021 3:20 PM  PATIENT: Karen Myers  76 y.o. female  Patient Care Team: Seward Carol, MD as PCP - General (Internal Medicine)  PRE-OPERATIVE DIAGNOSIS: Acute cholecystitis  POST-OPERATIVE DIAGNOSIS: Acute on chronic cholecystitis with hydrops gallbladder  PROCEDURE: Laparoscopic cholecystectomy with ICG cholangiography  SURGEON: Sharon Mt. Judia Arnott, MD  ASSISTANT: Alferd Apa PA-C  ANESTHESIA: General endotracheal  EBL: Total I/O In: 450 [I.V.:350; IV Piggyback:100] Out: 19 [Blood:50]  DRAINS: None  SPECIMEN: Gallbladder  COUNTS: Sponge, needle and instrument counts were reported correct x2 at the conclusion of the operation  DISPOSITION: PACU in satisfactory condition  COMPLICATIONS: None  FINDINGS: Acute cholecystitis with fibrosis, hydrops gallbladder, somewhat necrotic gallbladder wall  DESCRIPTION:   The patient was identified & brought into the operating room. She was then positioned supine on the OR table. SCDs were in place and active during the entire case. She then underwent general endotracheal anesthesia. Pressure points were padded. Hair on the abdomen was clipped by the OR team. The abdomen was prepped and draped in the standard sterile fashion. Antibiotics were administered. A surgical timeout was performed and confirmed our plan.   A periumbilical incision was made. The umbilical stalk was grasped and retracted outwardly. The infraumbilical fascia was identified and incised. The peritoneal cavity was gently entered bluntly. A purse-string 0 Vicryl suture was placed. The Hasson cannula was inserted into the peritoneal cavity and insufflation with CO2 commenced to 38mmHg. A laparoscope was inserted into the peritoneal cavity and inspection confirmed no evidence of trocar site complications. The patient was then positioned in reverse Trendelenburg with slight left side down. 3 additional 50mm trocars were placed along the right subcostal line - one  57mm port in mid subcostal region, another 18mm port in the right flank near the anterior axillary line, and a third 42mm port in the left subxiphoid region obliquely near the falciform ligament.  The liver and gallbladder were inspected. The gallbladder is tense and edematous, unable to be grasped. This was therefore decompressed with the [place] and clear turbid fluid freely flowed into the tubing consistent with hydrops. The gallbladder fundus was grasped and elevated cephalad. An additional grasper was then placed on the infundibulum of the gallbladder and the infundibulum was retracted laterally. Staying high on the gallbladder, the peritoneum on both sides of the gallbladder was opened with hook cautery. Gentle blunt dissection was then employed with a IT consultant working down into [employer]. The cystic duct was identified and carefully circumferentially dissected. The cystic artery was also identified and carefully circumferentially dissected. The space between the cystic artery and hepatocystic plate was developed such that a good view of the liver could be seen through a window medial to the cystic artery. The triangle of Calot had been cleared of all fibrofatty tissue. At this point, a critical view of safety was achieved and the only structures visualized was the skeletonized cystic duct laterally, the skeletonized cystic artery and the liver through the window medial to the artery. A small diminutive posterior cystic artery was noted  Near infrared fluorescence cholangiography was performed.  There is avid uptake of the tracer within the liver and biliary system.  Tracer is seen within the bile duct and within the duodenum, consistent with patency of the biliary system.  There is no contrast refluxing into the cystic duct or gallbladder.    The cystic duct and artery were clipped with 2 clips on the patient side and 1 clip on the specimen side. The cystic duct  and artery were then  divided.  The posterior cystic branch was controlled with 2 clips on the patient's side.  This was then divided with electrocautery.  The gallbladder was then freed from its remaining attachments to the liver using electrocautery and placed into an endocatch bag.  There was some raw surface oozing from the liver which was able to be adequately controlled using electrocautery.  The RUQ was gently irrigated with sterile saline. Hemostasis was then verified. The clips were in good position; the gallbladder fossa was dry. Surgicel SNoW was placed in the gallbladder fossa. The rest of the abdomen was inspected no injury nor bleeding elsewhere was identified.  The endocatch bag containing the gallbladder was then removed from the umbilical port site and passed off as specimen. The RUQ ports were removed under direct visualization and noted to be hemostatic. The umbilical fascia was then closed using the 0 Vicryl purse-string suture. The fascia was palpated and noted to be completely closed. The skin of all incision sites was approximated with 4-0 monocryl subcuticular suture and dermabond applied. She was then awakened from anesthesia, extubated, and transferred to a stretcher for transport to PACU in satisfactory condition.

## 2021-03-18 NOTE — Discharge Instructions (Addendum)
[facility] ______[facility] LAPAROSCOPIC SURGERY: POST OP INSTRUCTIONS Always review your discharge instruction sheet given to you by the facility where your surgery was performed. IF YOU HAVE DISABILITY OR FAMILY LEAVE FORMS, YOU MUST BRING THEM TO THE OFFICE FOR PROCESSING.   DO NOT GIVE THEM TO YOUR DOCTOR.  A prescription for pain medication may be given to you upon discharge.  Take your pain medication as prescribed, if needed.  If narcotic pain medicine is not needed, then you may take acetaminophen (Tylenol) or ibuprofen (Advil) as needed. Take your usually prescribed medications unless otherwise directed. If you need a refill on your pain medication, please contact your pharmacy.  They will contact our office to request authorization. Prescriptions will not be filled after 5pm or on week-ends. You should follow a light diet the first few days after arrival home, such as soup and crackers, etc.  Be sure to include lots of fluids daily. Most patients will experience some swelling and bruising in the area of the incisions.  Ice packs will help.  Swelling and bruising can take several days to resolve.  It is common to experience some constipation if taking pain medication after surgery.  Increasing fluid intake and taking a stool softener (such as Colace) will usually help or prevent this problem from occurring.  A mild laxative (Milk of Magnesia or Miralax) should be taken according to package instructions if there are no bowel movements after 48 hours. Unless discharge instructions indicate otherwise, you may remove your bandages 24-48 hours after surgery, and you may shower at that time.  You may have steri-strips (small skin tapes) in place directly over the incision.  These strips should be left on the skin for 7-10 days.  If your surgeon used skin glue on the incision, you may shower in 24 hours.  The glue will flake off over the next 2-3 weeks.  Any sutures or staples will be  removed at the office during your follow-up visit. ACTIVITIES:  You may resume regular (light) daily activities beginning the next day--such as daily self-care, walking, climbing stairs--gradually increasing activities as tolerated.  You may have sexual intercourse when it is comfortable.  Refrain from any heavy lifting or straining until approved by your doctor - nothing greater than 15 pounds for 6 weeks You may drive when you are no longer taking prescription pain medication, you can comfortably wear a seatbelt, and you can safely maneuver your car and apply brakes. RETURN TO WORK:  __________________________________________________________ You should see your doctor in the office for a follow-up appointment approximately 2-3 weeks after your surgery.  Make sure that you call for this appointment within a day or two after you arrive home to insure a convenient appointment time. OTHER INSTRUCTIONS: __________________________________________________________________________________________________________________________ __________________________________________________________________________________________________________________________ WHEN TO CALL YOUR DOCTOR: Fever over 101.0 Inability to urinate Continued bleeding from incision. Increased pain, redness, or drainage from the incision. Increasing abdominal pain  The clinic staff is available to answer your questions during regular business hours.  Please don't hesitate to call and ask to speak to one of the nurses for clinical concerns.  If you have a medical emergency, go to the nearest emergency room or call 911.  A surgeon from [facility] is always on call at the hospital. [address], [address], [place], [place]  [address] ? [address] [address], [place], [place]   [address] [phone] ? [phone] ? FAX [phone] Web site: [email or web address]  

## 2021-03-18 NOTE — ED Provider Triage Note (Signed)
Emergency Medicine Provider Triage Evaluation Note  JAMESIA LINNEN , a 76 y.o. female  was evaluated in triage.  Pt complains of right-sided upper abdominal pain.  Reports associated nausea.  Pain has been gradually worsening throughout the night.  Reports that she is scheduled to have cholecystectomy in approximately 1 week..  Review of Systems  Positive: Nausea, abdominal pain Negative: Fever, chills  Physical Exam  There were no vitals taken for this visit. Gen:   Awake, no distress   Resp:  Normal effort  MSK:   Moves extremities without difficulty  Other:  Right upper abdominal tenderness  Medical Decision Making  Medically screening exam initiated at 0410 AM.  Appropriate orders placed.  [name] was informed that the remainder of the evaluation will be completed by another provider, this initial triage assessment does not replace that evaluation, and the importance of remaining in the ED until their evaluation is complete.  Right upper quadrant abdominal pain  Ultrasound positive for cholecystitis.  Notified charge nurse.  Working on expediting patient back to her room.   Montine Circle, PA-C 03/18/21 [phone]

## 2021-03-18 NOTE — ED Triage Notes (Signed)
Please see downtime charting from patient arrival until 0630, including triage, pain medication administration and orders.

## 2021-03-18 NOTE — Anesthesia Postprocedure Evaluation (Signed)
Anesthesia Post Note  Patient: Karen Myers  Procedure(s) Performed: LAPAROSCOPIC CHOLECYSTECTOMY WITH ICG (Abdomen)     Patient location during evaluation: PACU Anesthesia Type: General Level of consciousness: sedated Pain management: pain level controlled Vital Signs Assessment: post-procedure vital signs reviewed and stable Respiratory status: spontaneous breathing and respiratory function stable Cardiovascular status: stable Postop Assessment: no apparent nausea or vomiting Anesthetic complications: no   No notable events documented.  Last Vitals:  Vitals:   03/18/21 1555 03/18/21 1610  BP: (!) 147/76 (!) 149/81  Pulse: 92 92  Resp: 18 19  Temp:  37.2 C  SpO2: 93% 93%    Last Pain:  Vitals:   03/18/21 1610  TempSrc:   PainSc: 0-No pain                 Suezette Lafave DANIEL

## 2021-03-18 NOTE — Anesthesia Preprocedure Evaluation (Signed)
Anesthesia Evaluation  Patient identified by MRN, date of birth, ID band Patient awake    Reviewed: Allergy & Precautions, H&P , NPO status , Patient's Chart, lab work & pertinent test results  Airway Mallampati: II   Neck ROM: full    Dental   Pulmonary former smoker,    breath sounds clear to auscultation       Cardiovascular hypertension,  Rhythm:regular Rate:Normal     Neuro/Psych    GI/Hepatic GERD  ,  Endo/Other    Renal/GU Renal InsufficiencyRenal disease     Musculoskeletal  (+) Arthritis , Osteoarthritis,    Abdominal   Peds  Hematology   Anesthesia Other Findings   Reproductive/Obstetrics Breast CA                             Anesthesia Physical  Anesthesia Plan  ASA: III  Anesthesia Plan: General   Post-op Pain Management:    Induction: Intravenous, Rapid sequence and Cricoid pressure planned  PONV Risk Score and Plan: 3 and Ondansetron, Dexamethasone, Treatment may vary due to age or medical condition and Midazolam  Airway Management Planned: Oral ETT  Additional Equipment:   Intra-op Plan:   Post-operative Plan: Extubation in OR  Informed Consent: I have reviewed the patients History and Physical, chart, labs and discussed the procedure including the risks, benefits and alternatives for the proposed anesthesia with the patient or authorized representative who has indicated his/her understanding and acceptance.       Plan Discussed with: CRNA, Anesthesiologist and Surgeon  Anesthesia Plan Comments:         Anesthesia Quick Evaluation

## 2021-03-18 NOTE — Anesthesia Procedure Notes (Signed)
Procedure Name: Intubation Date/Time: 03/18/2021 2:01 PM Performed by: Dorann Lodge, CRNA Pre-anesthesia Checklist: Patient identified, Emergency Drugs available, Suction available and Patient being monitored Patient Re-evaluated:Patient Re-evaluated prior to induction Oxygen Delivery Method: Circle System Utilized Preoxygenation: Pre-oxygenation with 100% oxygen Induction Type: IV induction Ventilation: Mask ventilation without difficulty Laryngoscope Size: Mac and 3 Grade View: Grade II Tube type: Oral Tube size: 7.0 mm Number of attempts: 1 Airway Equipment and Method: Stylet Placement Confirmation: ETT inserted through vocal cords under direct vision, positive ETCO2 and breath sounds checked- equal and bilateral Secured at: 22 cm Tube secured with: Tape Dental Injury: Teeth and Oropharynx as per pre-operative assessment

## 2021-03-18 NOTE — ED Provider Notes (Signed)
[facility] EMERGENCY DEPARTMENT Provider Note   CSN: [id number] Arrival date & time: 03/18/21  0407     History  Chief Complaint  Patient presents with   Abdominal Pain    Karen Myers is a 76 y.o. female.  Patient is a 76 year old female who presents with pain in her lower chest area.  She says its sharp discomfort.  Nonradiating.  She had similar pain recently when she had some gallstones removed from her common bile duct.  She is on the kidney transplant list per chart review.  On her general work-up for this, it was found that she had gallstones with stone in the common bile duct.  These were removed last month.  She is scheduled to have her gallbladder removed on February 16 at [facility].  Today she presents with a discomfort in her lower chest that started 2 days ago after eating a sandwich.  She says she has not eaten anything since that time.  Has been constant.  She has no exertional symptoms.  No pain on breathing.  No shortness of breath.  No nausea or vomiting.      Home Medications Prior to Admission medications   Medication Sig Start Date End Date Taking? Authorizing Provider  acetaminophen (TYLENOL) 500 MG tablet Take 1,000 mg by mouth 2 (two) times daily.    [provider]  aspirin EC 81 MG tablet Take 81 mg by mouth daily.    [provider]  atorvastatin (LIPITOR) 40 MG tablet Take 40 mg by mouth daily.    [provider]  DILT-XR 240 MG 24 hr capsule Take 240 mg by mouth daily. 12/09/20   [provider]  losartan (COZAAR) 100 MG tablet Take 100 mg by mouth daily.  03/07/20  [provider]      Allergies    Altace [ramipril], Lisinopril, and Losartan    Review of Systems   Review of Systems  Constitutional:  Negative for chills, diaphoresis, fatigue and fever.  HENT:  Negative for congestion, rhinorrhea and sneezing.   Eyes: Negative.   Respiratory:  Negative for cough,  chest tightness and shortness of breath.   Cardiovascular:  Positive for chest pain. Negative for leg swelling.  Gastrointestinal:  Negative for abdominal pain, blood in stool, diarrhea, nausea and vomiting.  Genitourinary:  Negative for difficulty urinating, flank pain, frequency and hematuria.  Musculoskeletal:  Negative for arthralgias and back pain.  Skin:  Negative for rash.  Neurological:  Negative for dizziness, speech difficulty, weakness, numbness and headaches.   Physical Exam Updated Vital Signs BP (!) 150/107 (BP Location: Left Arm)    Pulse (!) 103    Temp 98.1 F (36.7 C) (Oral)    Resp 16    SpO2 100%  Physical Exam Constitutional:      Appearance: She is well-developed.  HENT:     Head: Normocephalic and atraumatic.  Eyes:     Pupils: Pupils are equal, round, and reactive to light.  Cardiovascular:     Rate and Rhythm: Normal rate and regular rhythm.     Heart sounds: Normal heart sounds.  Pulmonary:     Effort: Pulmonary effort is normal. No respiratory distress.     Breath sounds: Normal breath sounds. No wheezing or rales.  Chest:     Chest wall: No tenderness.  Abdominal:     General: Bowel sounds are normal.     Palpations: Abdomen is soft.  Tenderness: There is no abdominal tenderness. There is no guarding or rebound.  Musculoskeletal:        General: Normal range of motion.     Cervical back: Normal range of motion and neck supple.  Lymphadenopathy:     Cervical: No cervical adenopathy.  Skin:    General: Skin is warm and dry.     Findings: No rash.  Neurological:     Mental Status: She is alert and oriented to person, place, and time.    ED Results / Procedures / Treatments   Labs (all labs ordered are listed, but only abnormal results are displayed) Labs Reviewed  CBC WITH DIFFERENTIAL/PLATELET - Abnormal; Notable for the following components:      Result Value   WBC 13.5 (*)    Neutro Abs 10.8 (*)    Monocytes Absolute 1.4 (*)    All  other components within normal limits  COMPREHENSIVE METABOLIC PANEL - Abnormal; Notable for the following components:   Sodium 134 (*)    CO2 17 (*)    Glucose, Bld 132 (*)    BUN 42 (*)    Creatinine, Ser 3.50 (*)    Calcium 10.4 (*)    Alkaline Phosphatase 154 (*)    GFR, Estimated 13 (*)    All other components within normal limits  LIPID PANEL - Abnormal; Notable for the following components:   Triglycerides 161 (*)    LDL Cholesterol 100 (*)    All other components within normal limits  RESP PANEL BY RT-PCR (FLU A&B, COVID) ARPGX2  LIPASE, BLOOD  TROPONIN I (HIGH SENSITIVITY)    EKG EKG Interpretation  Date/Time:  Thursday March 18 2021 04:16:49 EST Ventricular Rate:  104 PR Interval:  134 QRS Duration: 102 QT Interval:  352 QTC Calculation: 462 R Axis:   -16 Text Interpretation: Sinus tachycardia Right atrial enlargement Incomplete right bundle branch block Left ventricular hypertrophy with repolarization abnormality ( R in aVL , Cornell product , Romhilt-Estes ) Cannot rule out Septal infarct , age undetermined Abnormal ECG When compared with ECG of 13-Jul-2019 16:32, PREVIOUS ECG IS PRESENT SINCE LAST TRACING HEART RATE HAS INCREASED Confirmed by Malvin Johns [phone]) on 03/18/2021 11:46:46 AM  Radiology DG Chest 2 View  Result Date: 03/18/2021 CLINICAL DATA:  Chest pain.  Downtime dictation EXAM: CHEST - 2 VIEW COMPARISON:  07/13/2019 FINDINGS: Normal heart size and stable mediastinal contours. Postoperative left thoracic inlet. There is no edema, consolidation, effusion, or pneumothorax. IMPRESSION: Stable exam.  No evidence of acute disease. Electronically Signed   By: Jorje Guild M.D.   On: 03/18/2021 07:20   US Abdomen Limited RUQ (LIVER/GB)  Result Date: 03/18/2021 CLINICAL DATA:  Right upper quadrant pain EXAM: ULTRASOUND ABDOMEN LIMITED RIGHT UPPER QUADRANT COMPARISON:  Abdominal CT 06/20/2005 FINDINGS: Gallbladder: Distended gallbladder measuring up to 4  cm in diameter with low-density wall thickening measuring 4 mm. A stone is seen fixed at the neck with shadowing and adjacent sludge. No focal tenderness at time of sonographic exam, but need correlation with pain medication history. Common bile duct: Diameter: 9 mm. No available lab data in the setting of downtime. No biliary dilatation on comparison CT. Liver: Cyst in the upper liver measuring 16 mm. Within normal limits in parenchymal echogenicity. Portal vein is patent on color Doppler imaging with normal direction of blood flow towards the liver. Other: Study performed during downtime with no available worksheet. Prelim discussed with technologist and ordering physician. IMPRESSION: 1. Except for [employer],  sonographic findings of acute calculus cholecystitis. Please correlate with pain medication history. 2. 9 mm common bile duct diameter without visible choledocholithiasis. Electronically Signed   By: Jorje Guild M.D.   On: 03/18/2021 07:08    Procedures Procedures    Medications Ordered in ED Medications  fentaNYL (SUBLIMAZE) injection 50 mcg (50 mcg Intravenous Given 03/18/21 1235)  pantoprazole (PROTONIX) injection 40 mg (40 mg Intravenous Given 03/18/21 1231)    ED Course/ Medical Decision Making/ A&P                           Medical Decision Making Problems Addressed: Cholecystitis: acute illness or injury that poses a threat to life or bodily functions  Amount and/or Complexity of Data Reviewed External Data Reviewed: labs and notes. Labs: ordered. Decision-making details documented in ED Course. Radiology: ordered and independent interpretation performed. Decision-making details documented in ED Course. ECG/medicine tests: ordered and independent interpretation performed. Decision-making details documented in ED Course.  Risk Prescription drug management. Parenteral controlled substances. Decision regarding hospitalization.   Patient is a 76 year old who presents with  lower chest pain.  She states she had a similar pain after she had the stent removed from her common bile duct.  She does not have any ischemic changes on her EKG.  She has had a negative troponin.  She does not have other symptoms that sound more concerning for ACS.  Gallbladder ultrasound shows evidence of cholecystitis.  There is some dilatation of the common bile duct but no evident stones.  Her LFTs are normal other than her alkaline phosphatase.  Her lipase is normal without suggestions of pancreatitis.  Her creatinine is elevated but per chart review, it is similar to prior values.  Patient had her [facility].  She says that she is fine staying here to have her gallbladder removed.  Given the high census of hospitals, it proves difficult to transfer patients to other facilities.  I spoke with the general surgery team here who will see the patient and admit for cholecystectomy.  Patient was given pain medication and Protonix.  She did have a chest x-ray which was reviewed by me and shows no acute abnormalities.  Final Clinical Impression(s) / ED Diagnoses Final diagnoses:  Cholecystitis    Rx / DC Orders ED Discharge Orders     None         Malvin Johns, MD 03/18/21 1257

## 2021-03-18 NOTE — Transfer of Care (Addendum)
Immediate Anesthesia Transfer of Care Note  Patient: Karen Myers  Procedure(s) Performed: LAPAROSCOPIC CHOLECYSTECTOMY WITH ICG (Abdomen)  Patient Location: PACU  Anesthesia Type:General  Level of Consciousness: awake and drowsy  Airway & Oxygen Therapy: Patient Spontanous Breathing  Post-op Assessment: Report given to RN and Post -op Vital signs reviewed and stable  Post vital signs: Reviewed and stable  Last Vitals:  Vitals Value Taken Time  BP 164/87 03/18/21 1539  Temp    Pulse 89 03/18/21 1543  Resp 20 03/18/21 1543  SpO2 94 % 03/18/21 1543  Vitals shown include unvalidated device data.  Last Pain:  Vitals:   03/18/21 1340  TempSrc:   PainSc: 10-Worst pain ever      Patients Stated Pain Goal: 4 (58/94/83 4758)  Complications: No notable events documented.

## 2021-03-19 ENCOUNTER — Encounter (HOSPITAL_COMMUNITY): Payer: Self-pay | Admitting: Surgery

## 2021-03-22 LAB — SURGICAL PATHOLOGY

## 2021-04-30 DIAGNOSIS — N042 Nephrotic syndrome with diffuse membranous glomerulonephritis: Secondary | ICD-10-CM | POA: Diagnosis not present

## 2021-04-30 DIAGNOSIS — D631 Anemia in chronic kidney disease: Secondary | ICD-10-CM | POA: Diagnosis not present

## 2021-04-30 DIAGNOSIS — N185 Chronic kidney disease, stage 5: Secondary | ICD-10-CM | POA: Diagnosis not present

## 2021-04-30 DIAGNOSIS — E213 Hyperparathyroidism, unspecified: Secondary | ICD-10-CM | POA: Diagnosis not present

## 2021-04-30 DIAGNOSIS — N390 Urinary tract infection, site not specified: Secondary | ICD-10-CM | POA: Diagnosis not present

## 2021-04-30 DIAGNOSIS — N189 Chronic kidney disease, unspecified: Secondary | ICD-10-CM | POA: Diagnosis not present

## 2021-04-30 DIAGNOSIS — I120 Hypertensive chronic kidney disease with stage 5 chronic kidney disease or end stage renal disease: Secondary | ICD-10-CM | POA: Diagnosis not present

## 2021-05-12 DIAGNOSIS — N042 Nephrotic syndrome with diffuse membranous glomerulonephritis: Secondary | ICD-10-CM | POA: Diagnosis not present

## 2021-05-12 DIAGNOSIS — E785 Hyperlipidemia, unspecified: Secondary | ICD-10-CM | POA: Diagnosis not present

## 2021-05-12 DIAGNOSIS — E559 Vitamin D deficiency, unspecified: Secondary | ICD-10-CM | POA: Diagnosis not present

## 2021-05-12 DIAGNOSIS — Z0000 Encounter for general adult medical examination without abnormal findings: Secondary | ICD-10-CM | POA: Diagnosis not present

## 2021-05-12 DIAGNOSIS — N184 Chronic kidney disease, stage 4 (severe): Secondary | ICD-10-CM | POA: Diagnosis not present

## 2021-05-12 DIAGNOSIS — I10 Essential (primary) hypertension: Secondary | ICD-10-CM | POA: Diagnosis not present

## 2021-06-25 DIAGNOSIS — I120 Hypertensive chronic kidney disease with stage 5 chronic kidney disease or end stage renal disease: Secondary | ICD-10-CM | POA: Diagnosis not present

## 2021-06-25 DIAGNOSIS — D631 Anemia in chronic kidney disease: Secondary | ICD-10-CM | POA: Diagnosis not present

## 2021-06-25 DIAGNOSIS — N042 Nephrotic syndrome with diffuse membranous glomerulonephritis: Secondary | ICD-10-CM | POA: Diagnosis not present

## 2021-06-25 DIAGNOSIS — N185 Chronic kidney disease, stage 5: Secondary | ICD-10-CM | POA: Diagnosis not present

## 2021-06-25 DIAGNOSIS — N390 Urinary tract infection, site not specified: Secondary | ICD-10-CM | POA: Diagnosis not present

## 2021-06-25 DIAGNOSIS — N189 Chronic kidney disease, unspecified: Secondary | ICD-10-CM | POA: Diagnosis not present

## 2021-06-25 DIAGNOSIS — E213 Hyperparathyroidism, unspecified: Secondary | ICD-10-CM | POA: Diagnosis not present

## 2021-08-01 DIAGNOSIS — R918 Other nonspecific abnormal finding of lung field: Secondary | ICD-10-CM | POA: Diagnosis not present

## 2021-08-01 DIAGNOSIS — Z9013 Acquired absence of bilateral breasts and nipples: Secondary | ICD-10-CM | POA: Diagnosis not present

## 2021-08-01 DIAGNOSIS — M8580 Other specified disorders of bone density and structure, unspecified site: Secondary | ICD-10-CM | POA: Diagnosis not present

## 2021-08-01 DIAGNOSIS — N022 Recurrent and persistent hematuria with diffuse membranous glomerulonephritis: Secondary | ICD-10-CM | POA: Diagnosis not present

## 2021-08-01 DIAGNOSIS — N185 Chronic kidney disease, stage 5: Secondary | ICD-10-CM | POA: Diagnosis not present

## 2021-08-01 DIAGNOSIS — Z01810 Encounter for preprocedural cardiovascular examination: Secondary | ICD-10-CM | POA: Diagnosis not present

## 2021-08-01 DIAGNOSIS — Z7982 Long term (current) use of aspirin: Secondary | ICD-10-CM | POA: Diagnosis not present

## 2021-08-01 DIAGNOSIS — Z4822 Encounter for aftercare following kidney transplant: Secondary | ICD-10-CM | POA: Diagnosis not present

## 2021-08-01 DIAGNOSIS — N261 Atrophy of kidney (terminal): Secondary | ICD-10-CM | POA: Diagnosis not present

## 2021-08-01 DIAGNOSIS — I493 Ventricular premature depolarization: Secondary | ICD-10-CM | POA: Diagnosis not present

## 2021-08-01 DIAGNOSIS — N186 End stage renal disease: Secondary | ICD-10-CM | POA: Diagnosis not present

## 2021-08-01 DIAGNOSIS — Z801 Family history of malignant neoplasm of trachea, bronchus and lung: Secondary | ICD-10-CM | POA: Diagnosis not present

## 2021-08-01 DIAGNOSIS — Z5181 Encounter for therapeutic drug level monitoring: Secondary | ICD-10-CM | POA: Diagnosis not present

## 2021-08-01 DIAGNOSIS — D849 Immunodeficiency, unspecified: Secondary | ICD-10-CM | POA: Diagnosis not present

## 2021-08-01 DIAGNOSIS — Z79899 Other long term (current) drug therapy: Secondary | ICD-10-CM | POA: Diagnosis not present

## 2021-08-01 DIAGNOSIS — Z992 Dependence on renal dialysis: Secondary | ICD-10-CM | POA: Diagnosis not present

## 2021-08-01 DIAGNOSIS — I10 Essential (primary) hypertension: Secondary | ICD-10-CM | POA: Diagnosis not present

## 2021-08-01 DIAGNOSIS — E213 Hyperparathyroidism, unspecified: Secondary | ICD-10-CM | POA: Diagnosis not present

## 2021-08-01 DIAGNOSIS — N179 Acute kidney failure, unspecified: Secondary | ICD-10-CM | POA: Diagnosis not present

## 2021-08-01 DIAGNOSIS — Z9221 Personal history of antineoplastic chemotherapy: Secondary | ICD-10-CM | POA: Diagnosis not present

## 2021-08-01 DIAGNOSIS — Z940 Kidney transplant status: Secondary | ICD-10-CM | POA: Diagnosis not present

## 2021-08-01 DIAGNOSIS — Z20822 Contact with and (suspected) exposure to covid-19: Secondary | ICD-10-CM | POA: Diagnosis not present

## 2021-08-01 DIAGNOSIS — N269 Renal sclerosis, unspecified: Secondary | ICD-10-CM | POA: Diagnosis not present

## 2021-08-01 DIAGNOSIS — Z853 Personal history of malignant neoplasm of breast: Secondary | ICD-10-CM | POA: Diagnosis not present

## 2021-08-01 DIAGNOSIS — E785 Hyperlipidemia, unspecified: Secondary | ICD-10-CM | POA: Diagnosis not present

## 2021-08-01 DIAGNOSIS — I701 Atherosclerosis of renal artery: Secondary | ICD-10-CM | POA: Diagnosis not present

## 2021-08-03 DIAGNOSIS — N022 Recurrent and persistent hematuria with diffuse membranous glomerulonephritis: Secondary | ICD-10-CM | POA: Diagnosis not present

## 2021-08-03 DIAGNOSIS — N185 Chronic kidney disease, stage 5: Secondary | ICD-10-CM | POA: Diagnosis not present

## 2021-08-03 DIAGNOSIS — Z940 Kidney transplant status: Secondary | ICD-10-CM | POA: Diagnosis not present

## 2021-08-06 DIAGNOSIS — Z79621 Long term (current) use of calcineurin inhibitor: Secondary | ICD-10-CM | POA: Diagnosis not present

## 2021-08-06 DIAGNOSIS — Z7952 Long term (current) use of systemic steroids: Secondary | ICD-10-CM | POA: Diagnosis not present

## 2021-08-06 DIAGNOSIS — Z888 Allergy status to other drugs, medicaments and biological substances status: Secondary | ICD-10-CM | POA: Diagnosis not present

## 2021-08-06 DIAGNOSIS — Z79899 Other long term (current) drug therapy: Secondary | ICD-10-CM | POA: Diagnosis not present

## 2021-08-06 DIAGNOSIS — Z792 Long term (current) use of antibiotics: Secondary | ICD-10-CM | POA: Diagnosis not present

## 2021-08-06 DIAGNOSIS — Z4822 Encounter for aftercare following kidney transplant: Secondary | ICD-10-CM | POA: Diagnosis not present

## 2021-08-06 DIAGNOSIS — Z940 Kidney transplant status: Secondary | ICD-10-CM | POA: Diagnosis not present

## 2021-08-06 DIAGNOSIS — E785 Hyperlipidemia, unspecified: Secondary | ICD-10-CM | POA: Diagnosis not present

## 2021-08-06 DIAGNOSIS — D649 Anemia, unspecified: Secondary | ICD-10-CM | POA: Diagnosis not present

## 2021-08-06 DIAGNOSIS — Z7969 Long term (current) use of other immunomodulators and immunosuppressants: Secondary | ICD-10-CM | POA: Diagnosis not present

## 2021-08-06 DIAGNOSIS — I10 Essential (primary) hypertension: Secondary | ICD-10-CM | POA: Diagnosis not present

## 2021-08-06 DIAGNOSIS — D8989 Other specified disorders involving the immune mechanism, not elsewhere classified: Secondary | ICD-10-CM | POA: Diagnosis not present

## 2021-08-06 DIAGNOSIS — E119 Type 2 diabetes mellitus without complications: Secondary | ICD-10-CM | POA: Diagnosis not present

## 2021-08-09 DIAGNOSIS — Z4822 Encounter for aftercare following kidney transplant: Secondary | ICD-10-CM | POA: Diagnosis not present

## 2021-08-09 DIAGNOSIS — Z79624 Long term (current) use of inhibitors of nucleotide synthesis: Secondary | ICD-10-CM | POA: Diagnosis not present

## 2021-08-09 DIAGNOSIS — Z792 Long term (current) use of antibiotics: Secondary | ICD-10-CM | POA: Diagnosis not present

## 2021-08-09 DIAGNOSIS — E119 Type 2 diabetes mellitus without complications: Secondary | ICD-10-CM | POA: Diagnosis not present

## 2021-08-09 DIAGNOSIS — Z79899 Other long term (current) drug therapy: Secondary | ICD-10-CM | POA: Diagnosis not present

## 2021-08-09 DIAGNOSIS — D649 Anemia, unspecified: Secondary | ICD-10-CM | POA: Diagnosis not present

## 2021-08-09 DIAGNOSIS — E785 Hyperlipidemia, unspecified: Secondary | ICD-10-CM | POA: Diagnosis not present

## 2021-08-09 DIAGNOSIS — R791 Abnormal coagulation profile: Secondary | ICD-10-CM | POA: Diagnosis not present

## 2021-08-09 DIAGNOSIS — Z5181 Encounter for therapeutic drug level monitoring: Secondary | ICD-10-CM | POA: Diagnosis not present

## 2021-08-09 DIAGNOSIS — Z79621 Long term (current) use of calcineurin inhibitor: Secondary | ICD-10-CM | POA: Diagnosis not present

## 2021-08-09 DIAGNOSIS — D849 Immunodeficiency, unspecified: Secondary | ICD-10-CM | POA: Diagnosis not present

## 2021-08-09 DIAGNOSIS — Z1159 Encounter for screening for other viral diseases: Secondary | ICD-10-CM | POA: Diagnosis not present

## 2021-08-09 DIAGNOSIS — Z940 Kidney transplant status: Secondary | ICD-10-CM | POA: Diagnosis not present

## 2021-08-09 DIAGNOSIS — I10 Essential (primary) hypertension: Secondary | ICD-10-CM | POA: Diagnosis not present

## 2021-08-09 DIAGNOSIS — Z7952 Long term (current) use of systemic steroids: Secondary | ICD-10-CM | POA: Diagnosis not present

## 2021-08-09 DIAGNOSIS — E7849 Other hyperlipidemia: Secondary | ICD-10-CM | POA: Diagnosis not present

## 2021-08-12 DIAGNOSIS — Z4822 Encounter for aftercare following kidney transplant: Secondary | ICD-10-CM | POA: Diagnosis not present

## 2021-08-12 DIAGNOSIS — D649 Anemia, unspecified: Secondary | ICD-10-CM | POA: Diagnosis not present

## 2021-08-12 DIAGNOSIS — E213 Hyperparathyroidism, unspecified: Secondary | ICD-10-CM | POA: Diagnosis not present

## 2021-08-12 DIAGNOSIS — Z7952 Long term (current) use of systemic steroids: Secondary | ICD-10-CM | POA: Diagnosis not present

## 2021-08-12 DIAGNOSIS — E559 Vitamin D deficiency, unspecified: Secondary | ICD-10-CM | POA: Diagnosis not present

## 2021-08-12 DIAGNOSIS — E119 Type 2 diabetes mellitus without complications: Secondary | ICD-10-CM | POA: Diagnosis not present

## 2021-08-12 DIAGNOSIS — E782 Mixed hyperlipidemia: Secondary | ICD-10-CM | POA: Diagnosis not present

## 2021-08-12 DIAGNOSIS — M8580 Other specified disorders of bone density and structure, unspecified site: Secondary | ICD-10-CM | POA: Diagnosis not present

## 2021-08-12 DIAGNOSIS — Z792 Long term (current) use of antibiotics: Secondary | ICD-10-CM | POA: Diagnosis not present

## 2021-08-12 DIAGNOSIS — Z5181 Encounter for therapeutic drug level monitoring: Secondary | ICD-10-CM | POA: Diagnosis not present

## 2021-08-12 DIAGNOSIS — Z79899 Other long term (current) drug therapy: Secondary | ICD-10-CM | POA: Diagnosis not present

## 2021-08-12 DIAGNOSIS — Z79621 Long term (current) use of calcineurin inhibitor: Secondary | ICD-10-CM | POA: Diagnosis not present

## 2021-08-12 DIAGNOSIS — R7303 Prediabetes: Secondary | ICD-10-CM | POA: Diagnosis not present

## 2021-08-12 DIAGNOSIS — I10 Essential (primary) hypertension: Secondary | ICD-10-CM | POA: Diagnosis not present

## 2021-08-16 DIAGNOSIS — Z79621 Long term (current) use of calcineurin inhibitor: Secondary | ICD-10-CM | POA: Diagnosis not present

## 2021-08-16 DIAGNOSIS — E871 Hypo-osmolality and hyponatremia: Secondary | ICD-10-CM | POA: Diagnosis not present

## 2021-08-16 DIAGNOSIS — D84821 Immunodeficiency due to drugs: Secondary | ICD-10-CM | POA: Diagnosis not present

## 2021-08-16 DIAGNOSIS — D849 Immunodeficiency, unspecified: Secondary | ICD-10-CM | POA: Diagnosis not present

## 2021-08-16 DIAGNOSIS — I10 Essential (primary) hypertension: Secondary | ICD-10-CM | POA: Diagnosis not present

## 2021-08-16 DIAGNOSIS — E785 Hyperlipidemia, unspecified: Secondary | ICD-10-CM | POA: Diagnosis not present

## 2021-08-16 DIAGNOSIS — Z853 Personal history of malignant neoplasm of breast: Secondary | ICD-10-CM | POA: Diagnosis not present

## 2021-08-16 DIAGNOSIS — Z940 Kidney transplant status: Secondary | ICD-10-CM | POA: Diagnosis not present

## 2021-08-16 DIAGNOSIS — Z79899 Other long term (current) drug therapy: Secondary | ICD-10-CM | POA: Diagnosis not present

## 2021-08-16 DIAGNOSIS — Z7982 Long term (current) use of aspirin: Secondary | ICD-10-CM | POA: Diagnosis not present

## 2021-08-16 DIAGNOSIS — Z794 Long term (current) use of insulin: Secondary | ICD-10-CM | POA: Diagnosis not present

## 2021-08-16 DIAGNOSIS — Z7952 Long term (current) use of systemic steroids: Secondary | ICD-10-CM | POA: Diagnosis not present

## 2021-08-16 DIAGNOSIS — D631 Anemia in chronic kidney disease: Secondary | ICD-10-CM | POA: Diagnosis not present

## 2021-08-16 DIAGNOSIS — T8619 Other complication of kidney transplant: Secondary | ICD-10-CM | POA: Diagnosis not present

## 2021-08-16 DIAGNOSIS — Z792 Long term (current) use of antibiotics: Secondary | ICD-10-CM | POA: Diagnosis not present

## 2021-08-16 DIAGNOSIS — Z79624 Long term (current) use of inhibitors of nucleotide synthesis: Secondary | ICD-10-CM | POA: Diagnosis not present

## 2021-08-16 DIAGNOSIS — Z8249 Family history of ischemic heart disease and other diseases of the circulatory system: Secondary | ICD-10-CM | POA: Diagnosis not present

## 2021-08-16 DIAGNOSIS — Z9221 Personal history of antineoplastic chemotherapy: Secondary | ICD-10-CM | POA: Diagnosis not present

## 2021-08-16 DIAGNOSIS — E119 Type 2 diabetes mellitus without complications: Secondary | ICD-10-CM | POA: Diagnosis not present

## 2021-08-16 DIAGNOSIS — Z801 Family history of malignant neoplasm of trachea, bronchus and lung: Secondary | ICD-10-CM | POA: Diagnosis not present

## 2021-08-16 DIAGNOSIS — Z9013 Acquired absence of bilateral breasts and nipples: Secondary | ICD-10-CM | POA: Diagnosis not present

## 2021-08-16 DIAGNOSIS — N179 Acute kidney failure, unspecified: Secondary | ICD-10-CM | POA: Diagnosis not present

## 2021-08-19 DIAGNOSIS — Z940 Kidney transplant status: Secondary | ICD-10-CM | POA: Diagnosis not present

## 2021-08-19 DIAGNOSIS — N179 Acute kidney failure, unspecified: Secondary | ICD-10-CM | POA: Diagnosis not present

## 2021-08-19 DIAGNOSIS — D84821 Immunodeficiency due to drugs: Secondary | ICD-10-CM | POA: Diagnosis not present

## 2021-08-19 DIAGNOSIS — E871 Hypo-osmolality and hyponatremia: Secondary | ICD-10-CM | POA: Diagnosis not present

## 2021-08-19 DIAGNOSIS — T8619 Other complication of kidney transplant: Secondary | ICD-10-CM | POA: Diagnosis not present

## 2021-08-19 DIAGNOSIS — I10 Essential (primary) hypertension: Secondary | ICD-10-CM | POA: Diagnosis not present

## 2021-08-24 DIAGNOSIS — E119 Type 2 diabetes mellitus without complications: Secondary | ICD-10-CM | POA: Diagnosis not present

## 2021-08-24 DIAGNOSIS — Z79624 Long term (current) use of inhibitors of nucleotide synthesis: Secondary | ICD-10-CM | POA: Diagnosis not present

## 2021-08-24 DIAGNOSIS — Z466 Encounter for fitting and adjustment of urinary device: Secondary | ICD-10-CM | POA: Diagnosis not present

## 2021-08-24 DIAGNOSIS — Z940 Kidney transplant status: Secondary | ICD-10-CM | POA: Diagnosis not present

## 2021-08-24 DIAGNOSIS — Z792 Long term (current) use of antibiotics: Secondary | ICD-10-CM | POA: Diagnosis not present

## 2021-08-24 DIAGNOSIS — Z79899 Other long term (current) drug therapy: Secondary | ICD-10-CM | POA: Diagnosis not present

## 2021-08-24 DIAGNOSIS — Z4822 Encounter for aftercare following kidney transplant: Secondary | ICD-10-CM | POA: Diagnosis not present

## 2021-08-24 DIAGNOSIS — I10 Essential (primary) hypertension: Secondary | ICD-10-CM | POA: Diagnosis not present

## 2021-08-24 DIAGNOSIS — Z79621 Long term (current) use of calcineurin inhibitor: Secondary | ICD-10-CM | POA: Diagnosis not present

## 2021-08-24 DIAGNOSIS — D649 Anemia, unspecified: Secondary | ICD-10-CM | POA: Diagnosis not present

## 2021-08-24 DIAGNOSIS — E785 Hyperlipidemia, unspecified: Secondary | ICD-10-CM | POA: Diagnosis not present

## 2021-08-24 DIAGNOSIS — Z7952 Long term (current) use of systemic steroids: Secondary | ICD-10-CM | POA: Diagnosis not present

## 2021-08-24 DIAGNOSIS — E871 Hypo-osmolality and hyponatremia: Secondary | ICD-10-CM | POA: Diagnosis not present

## 2021-08-24 DIAGNOSIS — Z9889 Other specified postprocedural states: Secondary | ICD-10-CM | POA: Diagnosis not present

## 2021-08-26 DIAGNOSIS — E785 Hyperlipidemia, unspecified: Secondary | ICD-10-CM | POA: Diagnosis not present

## 2021-08-26 DIAGNOSIS — Z79620 Long term (current) use of immunosuppressive biologic: Secondary | ICD-10-CM | POA: Diagnosis not present

## 2021-08-26 DIAGNOSIS — Z79899 Other long term (current) drug therapy: Secondary | ICD-10-CM | POA: Diagnosis not present

## 2021-08-26 DIAGNOSIS — I10 Essential (primary) hypertension: Secondary | ICD-10-CM | POA: Diagnosis not present

## 2021-08-26 DIAGNOSIS — E119 Type 2 diabetes mellitus without complications: Secondary | ICD-10-CM | POA: Diagnosis not present

## 2021-08-26 DIAGNOSIS — R7989 Other specified abnormal findings of blood chemistry: Secondary | ICD-10-CM | POA: Diagnosis not present

## 2021-08-26 DIAGNOSIS — D649 Anemia, unspecified: Secondary | ICD-10-CM | POA: Diagnosis not present

## 2021-08-26 DIAGNOSIS — Z4822 Encounter for aftercare following kidney transplant: Secondary | ICD-10-CM | POA: Diagnosis not present

## 2021-08-30 DIAGNOSIS — Z888 Allergy status to other drugs, medicaments and biological substances status: Secondary | ICD-10-CM | POA: Diagnosis not present

## 2021-08-30 DIAGNOSIS — Z79621 Long term (current) use of calcineurin inhibitor: Secondary | ICD-10-CM | POA: Diagnosis not present

## 2021-08-30 DIAGNOSIS — I151 Hypertension secondary to other renal disorders: Secondary | ICD-10-CM | POA: Diagnosis not present

## 2021-08-30 DIAGNOSIS — Z4822 Encounter for aftercare following kidney transplant: Secondary | ICD-10-CM | POA: Diagnosis not present

## 2021-08-30 DIAGNOSIS — R7989 Other specified abnormal findings of blood chemistry: Secondary | ICD-10-CM | POA: Diagnosis not present

## 2021-08-30 DIAGNOSIS — E119 Type 2 diabetes mellitus without complications: Secondary | ICD-10-CM | POA: Diagnosis not present

## 2021-08-30 DIAGNOSIS — D849 Immunodeficiency, unspecified: Secondary | ICD-10-CM | POA: Diagnosis not present

## 2021-08-30 DIAGNOSIS — I10 Essential (primary) hypertension: Secondary | ICD-10-CM | POA: Diagnosis not present

## 2021-08-30 DIAGNOSIS — Z792 Long term (current) use of antibiotics: Secondary | ICD-10-CM | POA: Diagnosis not present

## 2021-08-30 DIAGNOSIS — E785 Hyperlipidemia, unspecified: Secondary | ICD-10-CM | POA: Diagnosis not present

## 2021-08-30 DIAGNOSIS — E871 Hypo-osmolality and hyponatremia: Secondary | ICD-10-CM | POA: Diagnosis not present

## 2021-08-30 DIAGNOSIS — Z7969 Long term (current) use of other immunomodulators and immunosuppressants: Secondary | ICD-10-CM | POA: Diagnosis not present

## 2021-08-30 DIAGNOSIS — D649 Anemia, unspecified: Secondary | ICD-10-CM | POA: Diagnosis not present

## 2021-08-30 DIAGNOSIS — Z79899 Other long term (current) drug therapy: Secondary | ICD-10-CM | POA: Diagnosis not present

## 2021-08-30 DIAGNOSIS — Z940 Kidney transplant status: Secondary | ICD-10-CM | POA: Diagnosis not present

## 2021-09-01 ENCOUNTER — Encounter (HOSPITAL_COMMUNITY): Payer: Self-pay | Admitting: Emergency Medicine

## 2021-09-01 ENCOUNTER — Ambulatory Visit (HOSPITAL_COMMUNITY)
Admission: EM | Admit: 2021-09-01 | Discharge: 2021-09-01 | Disposition: A | Discharge status: Home / Self Care | Payer: Medicare Other

## 2021-09-01 DIAGNOSIS — H1031 Unspecified acute conjunctivitis, right eye: Secondary | ICD-10-CM | POA: Diagnosis not present

## 2021-09-01 MED ORDER — POLYMYXIN B-TRIMETHOPRIM 10000-0.1 UNIT/ML-% OP SOLN: 1.0000 [drp] | Freq: Four times a day (QID) | OPHTHALMIC | 0 refills | Status: DC

## 2021-09-01 NOTE — ED Triage Notes (Signed)
Today right eye pinkish-red, sensitive to light that started today. Denies any drainage.

## 2021-09-01 NOTE — ED Provider Notes (Signed)
[facility]    CSN: [id number] Arrival date & time: 09/01/21  1423      History   Chief Complaint Chief Complaint  Patient presents with   Conjunctivitis    HPI Karen Myers is a 76 y.o. female.   76 year old female presents with right eye redness.  Patient indicates this morning her right eye started becoming red and irritated.  She relates she has had mild photophobia with that and irritation from sunlight.  Patient indicates she did not injure or get anything in the eye.  Patient indicates she has not had drainage but she does have a little bit of intermittent blurriness from the vision.  Patient indicates minimal pain is associated.  Patient does not recall being exposed to anyone week with pinkeye but she has been to the hospital at [facility] for periodic evaluation since she recently had a kidney transplant.   Conjunctivitis    Past Medical History:  Diagnosis Date   Arthritis    Breast cancer ([place])    left breast cancer    Breast cancer ([place])    Chronic kidney disease    CIN III (cervical intraepithelial neoplasia III)    prior to LEEP procedure   GERD (gastroesophageal reflux disease)    High cholesterol    Hypertension     Patient Active Problem List   Diagnosis Date Noted   Arthralgia of left lower leg 08/03/2020   Primary hyperparathyroidism ([place]) 12/07/2018   Hyperparathyroidism, primary ([place]) 05/27/2017   CIN 3 - cervical intraepithelial neoplasia grade 3 10/06/2011   Breast CA ([place]) 12/02/2010   Renal insufficiency 12/02/2010   Hypertension 12/02/2010   Hyperlipidemia 12/02/2010    Past Surgical History:  Procedure Laterality Date   BREAST LUMPECTOMY     CERVICAL BIOPSY  W/ LOOP ELECTRODE EXCISION  2005   CHOLECYSTECTOMY N/A 03/18/2021   Procedure: LAPAROSCOPIC CHOLECYSTECTOMY WITH ICG;  Surgeon: Ileana Roup, MD;  Location: [place];  Service: General;  Laterality: N/A;   COLONOSCOPY     HYSTEROSCOPY WITH D & C   2005   MASTECTOMY  2002   bilateral    PARATHYROIDECTOMY N/A 06/01/2017   Procedure: PARATHYROIDECTOMY;  Surgeon: Armandina Gemma, MD;  Location: [facility];  Service: General;  Laterality: N/A;   PARATHYROIDECTOMY N/A 12/07/2018   Procedure: NECK EXPLORATION, RIGHT INFERIOR PARATHYROIDECTOMY, LEFT THYROID LOBECTOMY;  Surgeon: Armandina Gemma, MD;  Location: [facility];  Service: General;  Laterality: N/A;   POLYPECTOMY     RENAL BIOPSY     VULVA Milagros Loll BIOPSY     nevus on vulva     OB History     Gravida  2   Para  2   Term      Preterm      AB      Living  2      SAB      IAB      Ectopic      Multiple      Live Births               Home Medications    Prior to Admission medications   Medication Sig Start Date End Date Taking? Authorizing Provider  mycophenolate (MYFORTIC) 180 MG EC tablet Take 180 mg by mouth in the morning and at bedtime. 08/13/21  Yes [provider]  polyethylene glycol (MIRALAX / GLYCOLAX) 17 g packet Take 17 g by mouth as needed. 08/04/21  Yes [provider]  senna-docusate (  SENOKOT-S) 8.6-50 MG tablet Take 1 tablet by mouth as needed. 08/04/21  Yes [provider]  sulfamethoxazole-trimethoprim (BACTRIM) 400-80 MG tablet Take 1 tablet by mouth daily. 08/04/21  Yes [provider]  trimethoprim-polymyxin b (POLYTRIM) ophthalmic solution Place 1 drop into both eyes every 6 (six) hours. 09/01/21  Yes Nyoka Lint, PA-C  valGANciclovir (VALCYTE) 450 MG tablet Take 450 mg by mouth daily. 08/04/21  Yes [provider]  acetaminophen (TYLENOL) 500 MG tablet Take 1,000 mg by mouth 2 (two) times daily.    [provider]  amLODipine (NORVASC) 5 MG tablet Take 5 mg by mouth daily as needed. 08/30/21   [provider]  aspirin EC 81 MG tablet Take 81 mg by mouth daily.    [provider]  atorvastatin (LIPITOR) 40 MG tablet Take 40 mg by mouth daily.    [provider]  DILT-XR 240  MG 24 hr capsule Take 240 mg by mouth daily. 12/09/20   [provider]  docusate sodium (COLACE) 100 MG capsule Take 1 capsule (100 mg total) by mouth daily as needed for mild constipation or moderate constipation. 03/18/21 03/18/22  Winferd Humphrey, PA-C  FA-B6-B12-D-Omega 3-Phytoster (ANIMI-3/VITAMIN D PO) Take 1 tablet by mouth daily.    [provider]  NIFEdipine (PROCARDIA-XL/NIFEDICAL-XL) 30 MG 24 hr tablet Take 30 mg by mouth daily. 08/04/21   [provider]  pantoprazole (PROTONIX) 40 MG tablet Take 40 mg by mouth daily. 08/31/21   [provider]  predniSONE (DELTASONE) 5 MG tablet Take 10 mg by mouth 2 (two) times daily. 08/31/21   [provider]  tacrolimus (PROGRAF) 1 MG capsule Take 1 mg by mouth in the morning and at bedtime. 08/23/21   [provider]  losartan (COZAAR) 100 MG tablet Take 100 mg by mouth daily.  03/07/20  [provider]    Family History No family history on file.  Social History Social History   Tobacco Use   Smoking status: Former   Smokeless tobacco: Never  Scientific laboratory technician Use: Never used  Substance Use Topics   Alcohol use: No   Drug use: No     Allergies   Altace [ramipril], Lisinopril, and Losartan   Review of Systems Review of Systems  Eyes:  Positive for photophobia, pain, redness and itching.     Physical Exam Triage Vital Signs ED Triage Vitals  Enc Vitals Group     BP 09/01/21 1457 125/77     Pulse Rate 09/01/21 1457 93     Resp 09/01/21 1457 18     Temp 09/01/21 1457 98.6 F (37 C)     Temp Source 09/01/21 1457 Oral     SpO2 09/01/21 1457 95 %     Weight --      Height --      Head Circumference --      Peak Flow --      Pain Score 09/01/21 1445 0     Pain Loc --      Pain Edu? --      Excl. in [place]? --    No data found.  Updated Vital Signs BP 125/77 (BP Location: Right Arm)   Pulse 93   Temp 98.6 F (37 C) (Oral)   Resp 18   SpO2 95%   Visual  Acuity Right Eye Distance:   Left Eye Distance:   Bilateral Distance:    Right Eye Near:   Left Eye Near:  Bilateral Near:     Physical Exam Constitutional:      Appearance: Normal appearance.  HENT:     Right Ear: Tympanic membrane and ear canal normal.     Left Ear: Tympanic membrane and ear canal normal.     Mouth/Throat:     Mouth: Mucous membranes are moist.     Pharynx: Oropharynx is clear. No pharyngeal swelling or posterior oropharyngeal erythema.  Eyes:     General: Lids are normal.     Extraocular Movements: Extraocular movements intact.     Comments: Eyes: Right conjunctiva is injected, no drainage.  Left conjunctiva is normal.  PERRLA  Neurological:     Mental Status: She is alert.      UC Treatments / Results  Labs (all labs ordered are listed, but only abnormal results are displayed) Labs Reviewed - No data to display  EKG   Radiology No results found.  Procedures Procedures (including critical care time)  Medications Ordered in UC Medications - No data to display  Initial Impression / Assessment and Plan / UC Course  I have reviewed the triage vital signs and the nursing notes.  Pertinent labs & imaging results that were available during my care of the patient were reviewed by me and considered in my medical decision making (see chart for details).    Plan: 1.  Advised to use the Polytrim eyedrops 1 drop in the eye 4 times a day until the condition clears. 2.  Advised to wear sunglasses to reduce the eye irritation from sunlight. 3.  Advised to follow-up PCP or return to urgent care if symptoms fail to improve. Final Clinical Impressions(s) / UC Diagnoses   Final diagnoses:  Acute bacterial conjunctivitis of right eye     Discharge Instructions      Advised to wear sunglasses to keep the eyes from being irritated from sunlight. Advised use Polytrim eyedrops 1 drop in the eye 4 times a day for the next several days until the condition  resolves. Advised to follow-up PCP or return to urgent care if symptoms fail to improve.    ED Prescriptions     Medication Sig Dispense Auth. Provider   trimethoprim-polymyxin b (POLYTRIM) ophthalmic solution Place 1 drop into both eyes every 6 (six) hours. 10 mL Nyoka Lint, PA-C      PDMP not reviewed this encounter.   Nyoka Lint, PA-C 09/01/21 1514

## 2021-09-01 NOTE — Discharge Instructions (Addendum)
Advised to wear sunglasses to keep the eyes from being irritated from sunlight. Advised use Polytrim eyedrops 1 drop in the eye 4 times a day for the next several days until the condition resolves. Advised to follow-up PCP or return to urgent care if symptoms fail to improve.

## 2021-09-02 DIAGNOSIS — Z4822 Encounter for aftercare following kidney transplant: Secondary | ICD-10-CM | POA: Diagnosis not present

## 2021-09-02 DIAGNOSIS — T8619 Other complication of kidney transplant: Secondary | ICD-10-CM | POA: Diagnosis not present

## 2021-09-02 DIAGNOSIS — Z940 Kidney transplant status: Secondary | ICD-10-CM | POA: Diagnosis not present

## 2021-09-02 DIAGNOSIS — N261 Atrophy of kidney (terminal): Secondary | ICD-10-CM | POA: Diagnosis not present

## 2021-09-02 DIAGNOSIS — N269 Renal sclerosis, unspecified: Secondary | ICD-10-CM | POA: Diagnosis not present

## 2021-09-02 DIAGNOSIS — I701 Atherosclerosis of renal artery: Secondary | ICD-10-CM | POA: Diagnosis not present

## 2021-09-06 DIAGNOSIS — Z7952 Long term (current) use of systemic steroids: Secondary | ICD-10-CM | POA: Diagnosis not present

## 2021-09-06 DIAGNOSIS — Z9221 Personal history of antineoplastic chemotherapy: Secondary | ICD-10-CM | POA: Diagnosis not present

## 2021-09-06 DIAGNOSIS — Z9013 Acquired absence of bilateral breasts and nipples: Secondary | ICD-10-CM | POA: Diagnosis not present

## 2021-09-06 DIAGNOSIS — Z79899 Other long term (current) drug therapy: Secondary | ICD-10-CM | POA: Diagnosis not present

## 2021-09-06 DIAGNOSIS — E213 Hyperparathyroidism, unspecified: Secondary | ICD-10-CM | POA: Diagnosis not present

## 2021-09-06 DIAGNOSIS — Z940 Kidney transplant status: Secondary | ICD-10-CM | POA: Diagnosis not present

## 2021-09-06 DIAGNOSIS — Z79624 Long term (current) use of inhibitors of nucleotide synthesis: Secondary | ICD-10-CM | POA: Diagnosis not present

## 2021-09-06 DIAGNOSIS — Z792 Long term (current) use of antibiotics: Secondary | ICD-10-CM | POA: Diagnosis not present

## 2021-09-06 DIAGNOSIS — E119 Type 2 diabetes mellitus without complications: Secondary | ICD-10-CM | POA: Diagnosis not present

## 2021-09-06 DIAGNOSIS — N022 Recurrent and persistent hematuria with diffuse membranous glomerulonephritis: Secondary | ICD-10-CM | POA: Diagnosis not present

## 2021-09-06 DIAGNOSIS — I10 Essential (primary) hypertension: Secondary | ICD-10-CM | POA: Diagnosis not present

## 2021-09-06 DIAGNOSIS — I151 Hypertension secondary to other renal disorders: Secondary | ICD-10-CM | POA: Diagnosis not present

## 2021-09-06 DIAGNOSIS — D649 Anemia, unspecified: Secondary | ICD-10-CM | POA: Diagnosis not present

## 2021-09-06 DIAGNOSIS — E785 Hyperlipidemia, unspecified: Secondary | ICD-10-CM | POA: Diagnosis not present

## 2021-09-06 DIAGNOSIS — Z79621 Long term (current) use of calcineurin inhibitor: Secondary | ICD-10-CM | POA: Diagnosis not present

## 2021-09-06 DIAGNOSIS — Z853 Personal history of malignant neoplasm of breast: Secondary | ICD-10-CM | POA: Diagnosis not present

## 2021-09-06 DIAGNOSIS — E892 Postprocedural hypoparathyroidism: Secondary | ICD-10-CM | POA: Diagnosis not present

## 2021-09-06 DIAGNOSIS — Z4822 Encounter for aftercare following kidney transplant: Secondary | ICD-10-CM | POA: Diagnosis not present

## 2021-09-13 DIAGNOSIS — I151 Hypertension secondary to other renal disorders: Secondary | ICD-10-CM | POA: Diagnosis not present

## 2021-09-13 DIAGNOSIS — Z79899 Other long term (current) drug therapy: Secondary | ICD-10-CM | POA: Diagnosis not present

## 2021-09-13 DIAGNOSIS — Z4822 Encounter for aftercare following kidney transplant: Secondary | ICD-10-CM | POA: Diagnosis not present

## 2021-09-13 DIAGNOSIS — Z7952 Long term (current) use of systemic steroids: Secondary | ICD-10-CM | POA: Diagnosis not present

## 2021-09-13 DIAGNOSIS — D849 Immunodeficiency, unspecified: Secondary | ICD-10-CM | POA: Diagnosis not present

## 2021-09-13 DIAGNOSIS — E785 Hyperlipidemia, unspecified: Secondary | ICD-10-CM | POA: Diagnosis not present

## 2021-09-13 DIAGNOSIS — Z940 Kidney transplant status: Secondary | ICD-10-CM | POA: Diagnosis not present

## 2021-09-13 DIAGNOSIS — E119 Type 2 diabetes mellitus without complications: Secondary | ICD-10-CM | POA: Diagnosis not present

## 2021-09-13 DIAGNOSIS — N2581 Secondary hyperparathyroidism of renal origin: Secondary | ICD-10-CM | POA: Diagnosis not present

## 2021-09-13 DIAGNOSIS — C50919 Malignant neoplasm of unspecified site of unspecified female breast: Secondary | ICD-10-CM | POA: Diagnosis not present

## 2021-09-13 DIAGNOSIS — I10 Essential (primary) hypertension: Secondary | ICD-10-CM | POA: Diagnosis not present

## 2021-09-13 DIAGNOSIS — D649 Anemia, unspecified: Secondary | ICD-10-CM | POA: Diagnosis not present

## 2021-09-13 DIAGNOSIS — E871 Hypo-osmolality and hyponatremia: Secondary | ICD-10-CM | POA: Diagnosis not present

## 2021-09-13 DIAGNOSIS — Z853 Personal history of malignant neoplasm of breast: Secondary | ICD-10-CM | POA: Diagnosis not present

## 2021-09-20 DIAGNOSIS — D849 Immunodeficiency, unspecified: Secondary | ICD-10-CM | POA: Diagnosis not present

## 2021-09-20 DIAGNOSIS — E559 Vitamin D deficiency, unspecified: Secondary | ICD-10-CM | POA: Diagnosis not present

## 2021-09-20 DIAGNOSIS — N2581 Secondary hyperparathyroidism of renal origin: Secondary | ICD-10-CM | POA: Diagnosis not present

## 2021-09-20 DIAGNOSIS — Z79899 Other long term (current) drug therapy: Secondary | ICD-10-CM | POA: Diagnosis not present

## 2021-09-20 DIAGNOSIS — Z79621 Long term (current) use of calcineurin inhibitor: Secondary | ICD-10-CM | POA: Diagnosis not present

## 2021-09-20 DIAGNOSIS — Z940 Kidney transplant status: Secondary | ICD-10-CM | POA: Diagnosis not present

## 2021-09-20 DIAGNOSIS — Z7952 Long term (current) use of systemic steroids: Secondary | ICD-10-CM | POA: Diagnosis not present

## 2021-09-20 DIAGNOSIS — I10 Essential (primary) hypertension: Secondary | ICD-10-CM | POA: Diagnosis not present

## 2021-09-20 DIAGNOSIS — Z792 Long term (current) use of antibiotics: Secondary | ICD-10-CM | POA: Diagnosis not present

## 2021-09-20 DIAGNOSIS — E210 Primary hyperparathyroidism: Secondary | ICD-10-CM | POA: Diagnosis not present

## 2021-09-20 DIAGNOSIS — E119 Type 2 diabetes mellitus without complications: Secondary | ICD-10-CM | POA: Diagnosis not present

## 2021-09-20 DIAGNOSIS — I151 Hypertension secondary to other renal disorders: Secondary | ICD-10-CM | POA: Diagnosis not present

## 2021-09-20 DIAGNOSIS — E785 Hyperlipidemia, unspecified: Secondary | ICD-10-CM | POA: Diagnosis not present

## 2021-09-20 DIAGNOSIS — Z4822 Encounter for aftercare following kidney transplant: Secondary | ICD-10-CM | POA: Diagnosis not present

## 2021-09-20 DIAGNOSIS — D649 Anemia, unspecified: Secondary | ICD-10-CM | POA: Diagnosis not present

## 2021-09-21 DIAGNOSIS — H40013 Open angle with borderline findings, low risk, bilateral: Secondary | ICD-10-CM | POA: Diagnosis not present

## 2021-09-21 DIAGNOSIS — H5213 Myopia, bilateral: Secondary | ICD-10-CM | POA: Diagnosis not present

## 2021-09-21 DIAGNOSIS — H524 Presbyopia: Secondary | ICD-10-CM | POA: Diagnosis not present

## 2021-09-21 DIAGNOSIS — H43813 Vitreous degeneration, bilateral: Secondary | ICD-10-CM | POA: Diagnosis not present

## 2021-09-21 DIAGNOSIS — H52223 Regular astigmatism, bilateral: Secondary | ICD-10-CM | POA: Diagnosis not present

## 2021-09-21 DIAGNOSIS — H35033 Hypertensive retinopathy, bilateral: Secondary | ICD-10-CM | POA: Diagnosis not present

## 2021-09-21 DIAGNOSIS — H25813 Combined forms of age-related cataract, bilateral: Secondary | ICD-10-CM | POA: Diagnosis not present

## 2021-09-27 DIAGNOSIS — E785 Hyperlipidemia, unspecified: Secondary | ICD-10-CM | POA: Diagnosis not present

## 2021-09-27 DIAGNOSIS — Z79899 Other long term (current) drug therapy: Secondary | ICD-10-CM | POA: Diagnosis not present

## 2021-09-27 DIAGNOSIS — E119 Type 2 diabetes mellitus without complications: Secondary | ICD-10-CM | POA: Diagnosis not present

## 2021-09-27 DIAGNOSIS — E210 Primary hyperparathyroidism: Secondary | ICD-10-CM | POA: Diagnosis not present

## 2021-09-27 DIAGNOSIS — Z4822 Encounter for aftercare following kidney transplant: Secondary | ICD-10-CM | POA: Diagnosis not present

## 2021-09-27 DIAGNOSIS — Z9089 Acquired absence of other organs: Secondary | ICD-10-CM | POA: Diagnosis not present

## 2021-09-27 DIAGNOSIS — E559 Vitamin D deficiency, unspecified: Secondary | ICD-10-CM | POA: Diagnosis not present

## 2021-09-27 DIAGNOSIS — Z7952 Long term (current) use of systemic steroids: Secondary | ICD-10-CM | POA: Diagnosis not present

## 2021-09-27 DIAGNOSIS — R1031 Right lower quadrant pain: Secondary | ICD-10-CM | POA: Diagnosis not present

## 2021-09-27 DIAGNOSIS — Z79624 Long term (current) use of inhibitors of nucleotide synthesis: Secondary | ICD-10-CM | POA: Diagnosis not present

## 2021-09-27 DIAGNOSIS — Z5181 Encounter for therapeutic drug level monitoring: Secondary | ICD-10-CM | POA: Diagnosis not present

## 2021-09-27 DIAGNOSIS — N2581 Secondary hyperparathyroidism of renal origin: Secondary | ICD-10-CM | POA: Diagnosis not present

## 2021-09-27 DIAGNOSIS — Z940 Kidney transplant status: Secondary | ICD-10-CM | POA: Diagnosis not present

## 2021-09-27 DIAGNOSIS — I10 Essential (primary) hypertension: Secondary | ICD-10-CM | POA: Diagnosis not present

## 2021-09-27 DIAGNOSIS — Z79621 Long term (current) use of calcineurin inhibitor: Secondary | ICD-10-CM | POA: Diagnosis not present

## 2021-09-27 DIAGNOSIS — Z792 Long term (current) use of antibiotics: Secondary | ICD-10-CM | POA: Diagnosis not present

## 2021-09-27 DIAGNOSIS — D649 Anemia, unspecified: Secondary | ICD-10-CM | POA: Diagnosis not present

## 2021-09-27 DIAGNOSIS — I151 Hypertension secondary to other renal disorders: Secondary | ICD-10-CM | POA: Diagnosis not present

## 2021-10-04 DIAGNOSIS — Z9089 Acquired absence of other organs: Secondary | ICD-10-CM | POA: Diagnosis not present

## 2021-10-04 DIAGNOSIS — D649 Anemia, unspecified: Secondary | ICD-10-CM | POA: Diagnosis not present

## 2021-10-04 DIAGNOSIS — Z792 Long term (current) use of antibiotics: Secondary | ICD-10-CM | POA: Diagnosis not present

## 2021-10-04 DIAGNOSIS — E785 Hyperlipidemia, unspecified: Secondary | ICD-10-CM | POA: Diagnosis not present

## 2021-10-04 DIAGNOSIS — E119 Type 2 diabetes mellitus without complications: Secondary | ICD-10-CM | POA: Diagnosis not present

## 2021-10-04 DIAGNOSIS — Z7952 Long term (current) use of systemic steroids: Secondary | ICD-10-CM | POA: Diagnosis not present

## 2021-10-04 DIAGNOSIS — N2889 Other specified disorders of kidney and ureter: Secondary | ICD-10-CM | POA: Diagnosis not present

## 2021-10-04 DIAGNOSIS — Z79621 Long term (current) use of calcineurin inhibitor: Secondary | ICD-10-CM | POA: Diagnosis not present

## 2021-10-04 DIAGNOSIS — Z79899 Other long term (current) drug therapy: Secondary | ICD-10-CM | POA: Diagnosis not present

## 2021-10-04 DIAGNOSIS — Z4822 Encounter for aftercare following kidney transplant: Secondary | ICD-10-CM | POA: Diagnosis not present

## 2021-10-04 DIAGNOSIS — N2581 Secondary hyperparathyroidism of renal origin: Secondary | ICD-10-CM | POA: Diagnosis not present

## 2021-10-04 DIAGNOSIS — I151 Hypertension secondary to other renal disorders: Secondary | ICD-10-CM | POA: Diagnosis not present

## 2021-10-04 DIAGNOSIS — Z940 Kidney transplant status: Secondary | ICD-10-CM | POA: Diagnosis not present

## 2021-10-04 DIAGNOSIS — D849 Immunodeficiency, unspecified: Secondary | ICD-10-CM | POA: Diagnosis not present

## 2021-10-04 DIAGNOSIS — I10 Essential (primary) hypertension: Secondary | ICD-10-CM | POA: Diagnosis not present

## 2021-10-13 DIAGNOSIS — D849 Immunodeficiency, unspecified: Secondary | ICD-10-CM | POA: Diagnosis not present

## 2021-10-13 DIAGNOSIS — Z940 Kidney transplant status: Secondary | ICD-10-CM | POA: Diagnosis not present

## 2021-10-20 DIAGNOSIS — Z940 Kidney transplant status: Secondary | ICD-10-CM | POA: Diagnosis not present

## 2021-10-20 DIAGNOSIS — Z792 Long term (current) use of antibiotics: Secondary | ICD-10-CM | POA: Diagnosis not present

## 2021-10-20 DIAGNOSIS — D849 Immunodeficiency, unspecified: Secondary | ICD-10-CM | POA: Diagnosis not present

## 2021-10-20 DIAGNOSIS — E785 Hyperlipidemia, unspecified: Secondary | ICD-10-CM | POA: Diagnosis not present

## 2021-10-20 DIAGNOSIS — E892 Postprocedural hypoparathyroidism: Secondary | ICD-10-CM | POA: Diagnosis not present

## 2021-10-20 DIAGNOSIS — I10 Essential (primary) hypertension: Secondary | ICD-10-CM | POA: Diagnosis not present

## 2021-10-20 DIAGNOSIS — I151 Hypertension secondary to other renal disorders: Secondary | ICD-10-CM | POA: Diagnosis not present

## 2021-10-20 DIAGNOSIS — D649 Anemia, unspecified: Secondary | ICD-10-CM | POA: Diagnosis not present

## 2021-10-20 DIAGNOSIS — Z79621 Long term (current) use of calcineurin inhibitor: Secondary | ICD-10-CM | POA: Diagnosis not present

## 2021-10-20 DIAGNOSIS — Z79899 Other long term (current) drug therapy: Secondary | ICD-10-CM | POA: Diagnosis not present

## 2021-10-20 DIAGNOSIS — E119 Type 2 diabetes mellitus without complications: Secondary | ICD-10-CM | POA: Diagnosis not present

## 2021-10-20 DIAGNOSIS — Z79624 Long term (current) use of inhibitors of nucleotide synthesis: Secondary | ICD-10-CM | POA: Diagnosis not present

## 2021-10-20 DIAGNOSIS — E782 Mixed hyperlipidemia: Secondary | ICD-10-CM | POA: Diagnosis not present

## 2021-10-20 DIAGNOSIS — R809 Proteinuria, unspecified: Secondary | ICD-10-CM | POA: Diagnosis not present

## 2021-10-20 DIAGNOSIS — Z7952 Long term (current) use of systemic steroids: Secondary | ICD-10-CM | POA: Diagnosis not present

## 2021-10-20 DIAGNOSIS — Z5181 Encounter for therapeutic drug level monitoring: Secondary | ICD-10-CM | POA: Diagnosis not present

## 2021-10-20 DIAGNOSIS — Z4822 Encounter for aftercare following kidney transplant: Secondary | ICD-10-CM | POA: Diagnosis not present

## 2021-10-20 DIAGNOSIS — E559 Vitamin D deficiency, unspecified: Secondary | ICD-10-CM | POA: Diagnosis not present

## 2021-11-03 DIAGNOSIS — E892 Postprocedural hypoparathyroidism: Secondary | ICD-10-CM | POA: Diagnosis not present

## 2021-11-03 DIAGNOSIS — Z888 Allergy status to other drugs, medicaments and biological substances status: Secondary | ICD-10-CM | POA: Diagnosis not present

## 2021-11-03 DIAGNOSIS — Z940 Kidney transplant status: Secondary | ICD-10-CM | POA: Diagnosis not present

## 2021-11-03 DIAGNOSIS — Z7952 Long term (current) use of systemic steroids: Secondary | ICD-10-CM | POA: Diagnosis not present

## 2021-11-03 DIAGNOSIS — Z79621 Long term (current) use of calcineurin inhibitor: Secondary | ICD-10-CM | POA: Diagnosis not present

## 2021-11-03 DIAGNOSIS — Z792 Long term (current) use of antibiotics: Secondary | ICD-10-CM | POA: Diagnosis not present

## 2021-11-03 DIAGNOSIS — Z853 Personal history of malignant neoplasm of breast: Secondary | ICD-10-CM | POA: Diagnosis not present

## 2021-11-03 DIAGNOSIS — Z79624 Long term (current) use of inhibitors of nucleotide synthesis: Secondary | ICD-10-CM | POA: Diagnosis not present

## 2021-11-03 DIAGNOSIS — I10 Essential (primary) hypertension: Secondary | ICD-10-CM | POA: Diagnosis not present

## 2021-11-03 DIAGNOSIS — Z4822 Encounter for aftercare following kidney transplant: Secondary | ICD-10-CM | POA: Diagnosis not present

## 2021-11-03 DIAGNOSIS — Z9089 Acquired absence of other organs: Secondary | ICD-10-CM | POA: Diagnosis not present

## 2021-11-03 DIAGNOSIS — E785 Hyperlipidemia, unspecified: Secondary | ICD-10-CM | POA: Diagnosis not present

## 2021-11-03 DIAGNOSIS — Z79899 Other long term (current) drug therapy: Secondary | ICD-10-CM | POA: Diagnosis not present

## 2021-11-03 DIAGNOSIS — E119 Type 2 diabetes mellitus without complications: Secondary | ICD-10-CM | POA: Diagnosis not present

## 2021-11-03 DIAGNOSIS — D649 Anemia, unspecified: Secondary | ICD-10-CM | POA: Diagnosis not present

## 2021-11-03 DIAGNOSIS — Z8639 Personal history of other endocrine, nutritional and metabolic disease: Secondary | ICD-10-CM | POA: Diagnosis not present

## 2021-11-03 DIAGNOSIS — D8989 Other specified disorders involving the immune mechanism, not elsewhere classified: Secondary | ICD-10-CM | POA: Diagnosis not present

## 2021-11-17 DIAGNOSIS — Z5181 Encounter for therapeutic drug level monitoring: Secondary | ICD-10-CM | POA: Diagnosis not present

## 2021-11-17 DIAGNOSIS — Z79621 Long term (current) use of calcineurin inhibitor: Secondary | ICD-10-CM | POA: Diagnosis not present

## 2021-11-17 DIAGNOSIS — Z4822 Encounter for aftercare following kidney transplant: Secondary | ICD-10-CM | POA: Diagnosis not present

## 2021-11-22 DIAGNOSIS — Z4822 Encounter for aftercare following kidney transplant: Secondary | ICD-10-CM | POA: Diagnosis not present

## 2021-11-22 DIAGNOSIS — R8279 Other abnormal findings on microbiological examination of urine: Secondary | ICD-10-CM | POA: Diagnosis not present

## 2021-11-25 DIAGNOSIS — M8580 Other specified disorders of bone density and structure, unspecified site: Secondary | ICD-10-CM | POA: Diagnosis not present

## 2021-11-25 DIAGNOSIS — Z9013 Acquired absence of bilateral breasts and nipples: Secondary | ICD-10-CM | POA: Diagnosis not present

## 2021-11-25 DIAGNOSIS — Z780 Asymptomatic menopausal state: Secondary | ICD-10-CM | POA: Diagnosis not present

## 2021-11-30 ENCOUNTER — Other Ambulatory Visit: Payer: Self-pay | Admitting: Obstetrics and Gynecology

## 2021-11-30 DIAGNOSIS — M8580 Other specified disorders of bone density and structure, unspecified site: Secondary | ICD-10-CM

## 2021-12-01 DIAGNOSIS — Z7952 Long term (current) use of systemic steroids: Secondary | ICD-10-CM | POA: Diagnosis not present

## 2021-12-01 DIAGNOSIS — E119 Type 2 diabetes mellitus without complications: Secondary | ICD-10-CM | POA: Diagnosis not present

## 2021-12-01 DIAGNOSIS — Z4822 Encounter for aftercare following kidney transplant: Secondary | ICD-10-CM | POA: Diagnosis not present

## 2021-12-01 DIAGNOSIS — D649 Anemia, unspecified: Secondary | ICD-10-CM | POA: Diagnosis not present

## 2021-12-01 DIAGNOSIS — E211 Secondary hyperparathyroidism, not elsewhere classified: Secondary | ICD-10-CM | POA: Diagnosis not present

## 2021-12-01 DIAGNOSIS — E871 Hypo-osmolality and hyponatremia: Secondary | ICD-10-CM | POA: Diagnosis not present

## 2021-12-01 DIAGNOSIS — Z7969 Long term (current) use of other immunomodulators and immunosuppressants: Secondary | ICD-10-CM | POA: Diagnosis not present

## 2021-12-01 DIAGNOSIS — Z79899 Other long term (current) drug therapy: Secondary | ICD-10-CM | POA: Diagnosis not present

## 2021-12-01 DIAGNOSIS — I10 Essential (primary) hypertension: Secondary | ICD-10-CM | POA: Diagnosis not present

## 2021-12-01 DIAGNOSIS — E785 Hyperlipidemia, unspecified: Secondary | ICD-10-CM | POA: Diagnosis not present

## 2021-12-01 DIAGNOSIS — Z792 Long term (current) use of antibiotics: Secondary | ICD-10-CM | POA: Diagnosis not present

## 2021-12-01 DIAGNOSIS — E213 Hyperparathyroidism, unspecified: Secondary | ICD-10-CM | POA: Diagnosis not present

## 2021-12-15 DIAGNOSIS — Z940 Kidney transplant status: Secondary | ICD-10-CM | POA: Diagnosis not present

## 2021-12-15 DIAGNOSIS — Z79899 Other long term (current) drug therapy: Secondary | ICD-10-CM | POA: Diagnosis not present

## 2021-12-29 DIAGNOSIS — Z79624 Long term (current) use of inhibitors of nucleotide synthesis: Secondary | ICD-10-CM | POA: Diagnosis not present

## 2021-12-29 DIAGNOSIS — M8580 Other specified disorders of bone density and structure, unspecified site: Secondary | ICD-10-CM | POA: Diagnosis not present

## 2021-12-29 DIAGNOSIS — Z792 Long term (current) use of antibiotics: Secondary | ICD-10-CM | POA: Diagnosis not present

## 2021-12-29 DIAGNOSIS — Z7952 Long term (current) use of systemic steroids: Secondary | ICD-10-CM | POA: Diagnosis not present

## 2021-12-29 DIAGNOSIS — Z9013 Acquired absence of bilateral breasts and nipples: Secondary | ICD-10-CM | POA: Diagnosis not present

## 2021-12-29 DIAGNOSIS — Z4822 Encounter for aftercare following kidney transplant: Secondary | ICD-10-CM | POA: Diagnosis not present

## 2021-12-29 DIAGNOSIS — Z9221 Personal history of antineoplastic chemotherapy: Secondary | ICD-10-CM | POA: Diagnosis not present

## 2021-12-29 DIAGNOSIS — E892 Postprocedural hypoparathyroidism: Secondary | ICD-10-CM | POA: Diagnosis not present

## 2021-12-29 DIAGNOSIS — Z79621 Long term (current) use of calcineurin inhibitor: Secondary | ICD-10-CM | POA: Diagnosis not present

## 2021-12-29 DIAGNOSIS — Z8639 Personal history of other endocrine, nutritional and metabolic disease: Secondary | ICD-10-CM | POA: Diagnosis not present

## 2021-12-29 DIAGNOSIS — Z940 Kidney transplant status: Secondary | ICD-10-CM | POA: Diagnosis not present

## 2021-12-29 DIAGNOSIS — Z853 Personal history of malignant neoplasm of breast: Secondary | ICD-10-CM | POA: Diagnosis not present

## 2021-12-29 DIAGNOSIS — E119 Type 2 diabetes mellitus without complications: Secondary | ICD-10-CM | POA: Diagnosis not present

## 2021-12-29 DIAGNOSIS — E785 Hyperlipidemia, unspecified: Secondary | ICD-10-CM | POA: Diagnosis not present

## 2021-12-29 DIAGNOSIS — D649 Anemia, unspecified: Secondary | ICD-10-CM | POA: Diagnosis not present

## 2021-12-29 DIAGNOSIS — I10 Essential (primary) hypertension: Secondary | ICD-10-CM | POA: Diagnosis not present

## 2021-12-29 DIAGNOSIS — Z79899 Other long term (current) drug therapy: Secondary | ICD-10-CM | POA: Diagnosis not present

## 2022-01-12 DIAGNOSIS — Z4822 Encounter for aftercare following kidney transplant: Secondary | ICD-10-CM | POA: Diagnosis not present

## 2022-01-26 DIAGNOSIS — Z940 Kidney transplant status: Secondary | ICD-10-CM | POA: Diagnosis not present

## 2022-01-26 DIAGNOSIS — I10 Essential (primary) hypertension: Secondary | ICD-10-CM | POA: Diagnosis not present

## 2022-01-26 DIAGNOSIS — Z79624 Long term (current) use of inhibitors of nucleotide synthesis: Secondary | ICD-10-CM | POA: Diagnosis not present

## 2022-01-26 DIAGNOSIS — D849 Immunodeficiency, unspecified: Secondary | ICD-10-CM | POA: Diagnosis not present

## 2022-01-26 DIAGNOSIS — Z79621 Long term (current) use of calcineurin inhibitor: Secondary | ICD-10-CM | POA: Diagnosis not present

## 2022-01-26 DIAGNOSIS — D72819 Decreased white blood cell count, unspecified: Secondary | ICD-10-CM | POA: Diagnosis not present

## 2022-01-26 DIAGNOSIS — E2089 Other specified hypoparathyroidism: Secondary | ICD-10-CM | POA: Diagnosis not present

## 2022-01-26 DIAGNOSIS — Z7952 Long term (current) use of systemic steroids: Secondary | ICD-10-CM | POA: Diagnosis not present

## 2022-01-26 DIAGNOSIS — Z4822 Encounter for aftercare following kidney transplant: Secondary | ICD-10-CM | POA: Diagnosis not present

## 2022-01-26 DIAGNOSIS — N022 Recurrent and persistent hematuria with diffuse membranous glomerulonephritis: Secondary | ICD-10-CM | POA: Diagnosis not present

## 2022-01-26 DIAGNOSIS — Z792 Long term (current) use of antibiotics: Secondary | ICD-10-CM | POA: Diagnosis not present

## 2022-01-26 DIAGNOSIS — R809 Proteinuria, unspecified: Secondary | ICD-10-CM | POA: Diagnosis not present

## 2022-01-26 DIAGNOSIS — B259 Cytomegaloviral disease, unspecified: Secondary | ICD-10-CM | POA: Diagnosis not present

## 2022-01-26 DIAGNOSIS — R7989 Other specified abnormal findings of blood chemistry: Secondary | ICD-10-CM | POA: Diagnosis not present

## 2022-01-26 DIAGNOSIS — E785 Hyperlipidemia, unspecified: Secondary | ICD-10-CM | POA: Diagnosis not present

## 2022-01-26 DIAGNOSIS — Z853 Personal history of malignant neoplasm of breast: Secondary | ICD-10-CM | POA: Diagnosis not present

## 2022-01-26 DIAGNOSIS — D649 Anemia, unspecified: Secondary | ICD-10-CM | POA: Diagnosis not present

## 2022-01-26 DIAGNOSIS — N179 Acute kidney failure, unspecified: Secondary | ICD-10-CM | POA: Diagnosis not present

## 2022-01-26 DIAGNOSIS — Z79899 Other long term (current) drug therapy: Secondary | ICD-10-CM | POA: Diagnosis not present

## 2022-01-26 DIAGNOSIS — E119 Type 2 diabetes mellitus without complications: Secondary | ICD-10-CM | POA: Diagnosis not present

## 2022-01-26 IMAGING — CR DG HIP (WITH OR WITHOUT PELVIS) 2-3V*L*
2 series · 2 of 2 positions shown · non-contrast
Comparison: 05/01/2020

CLINICAL DATA: Left hip pain

EXAM:
DG HIP (WITH OR WITHOUT PELVIS) 2-3V LEFT

[t hip ap left]
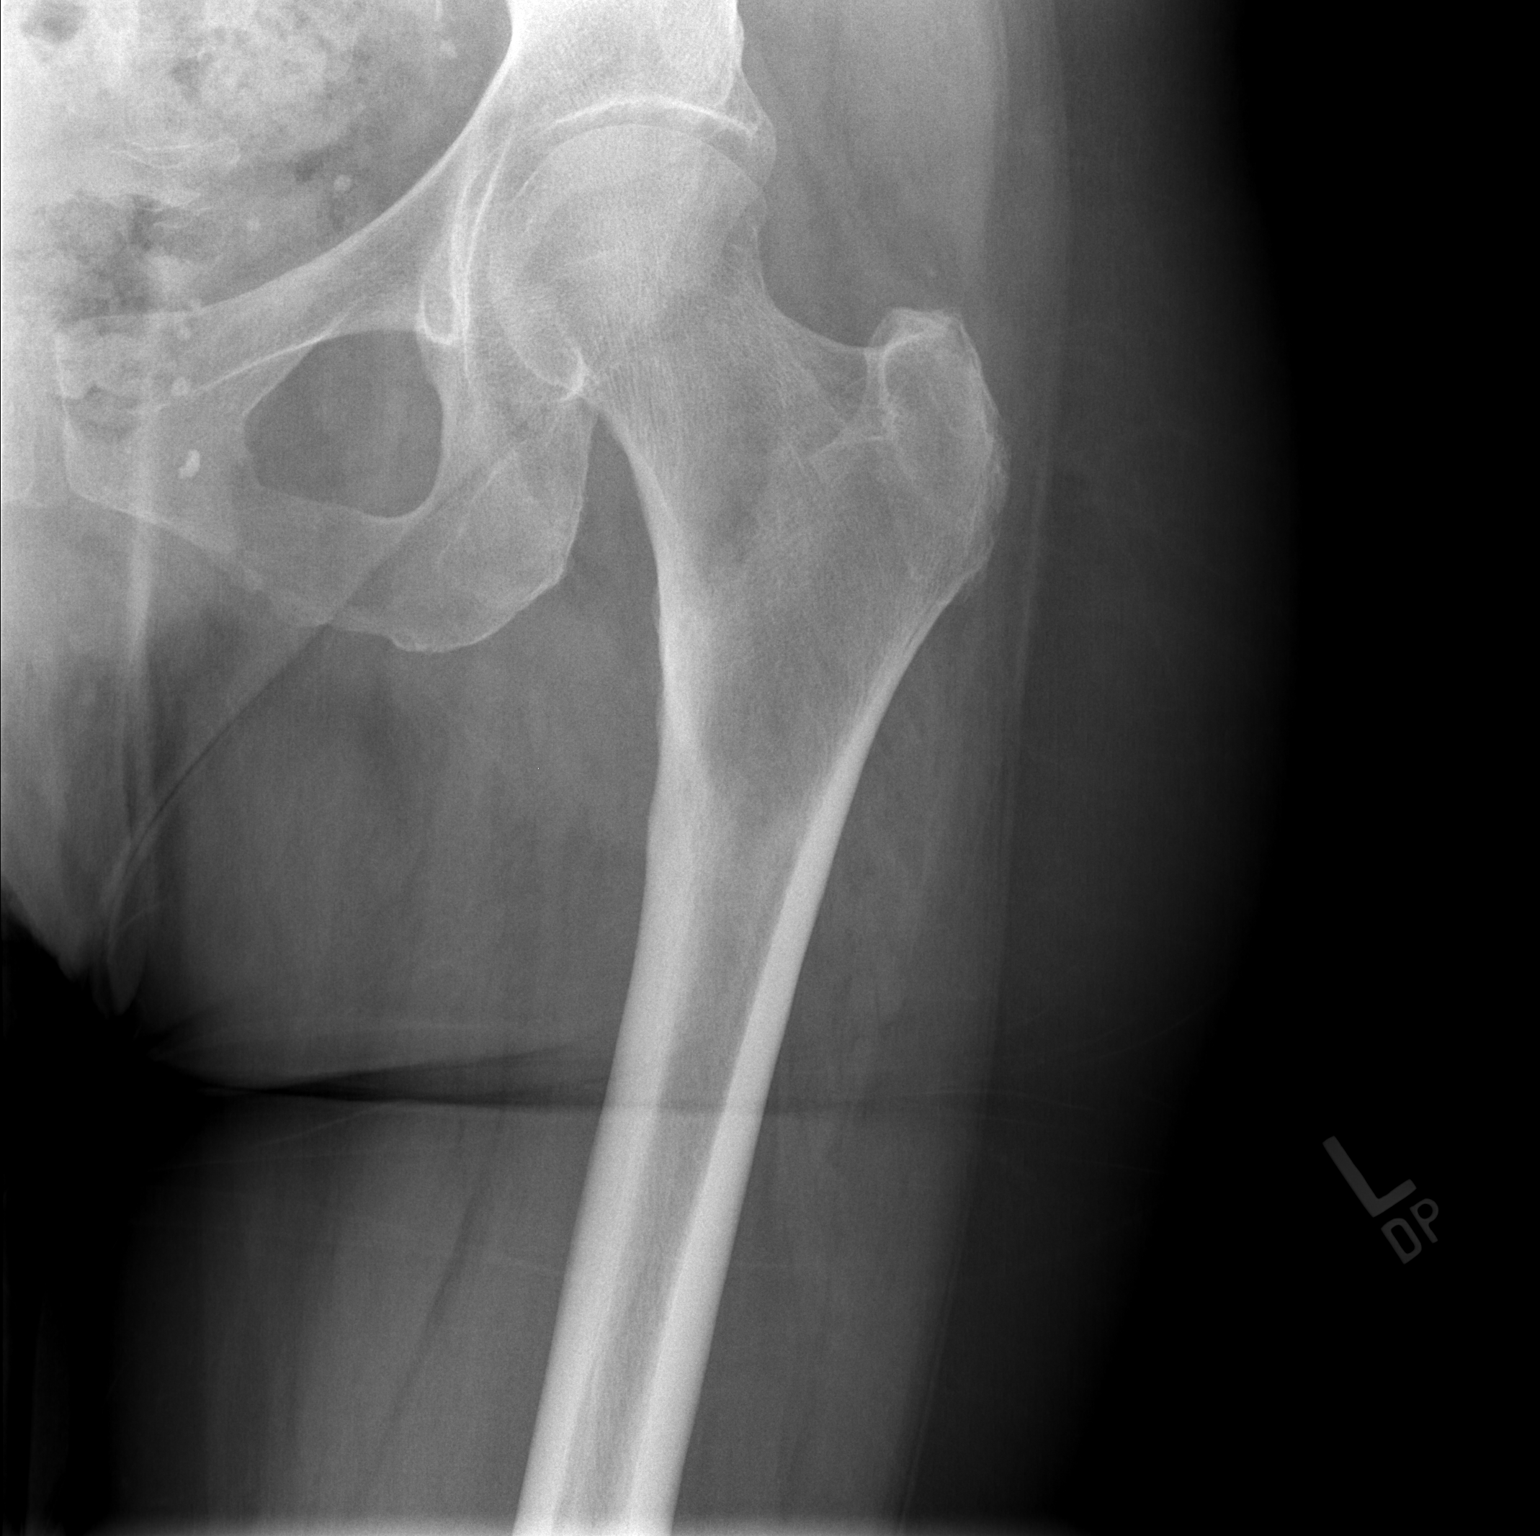

[t hip frog leg left]
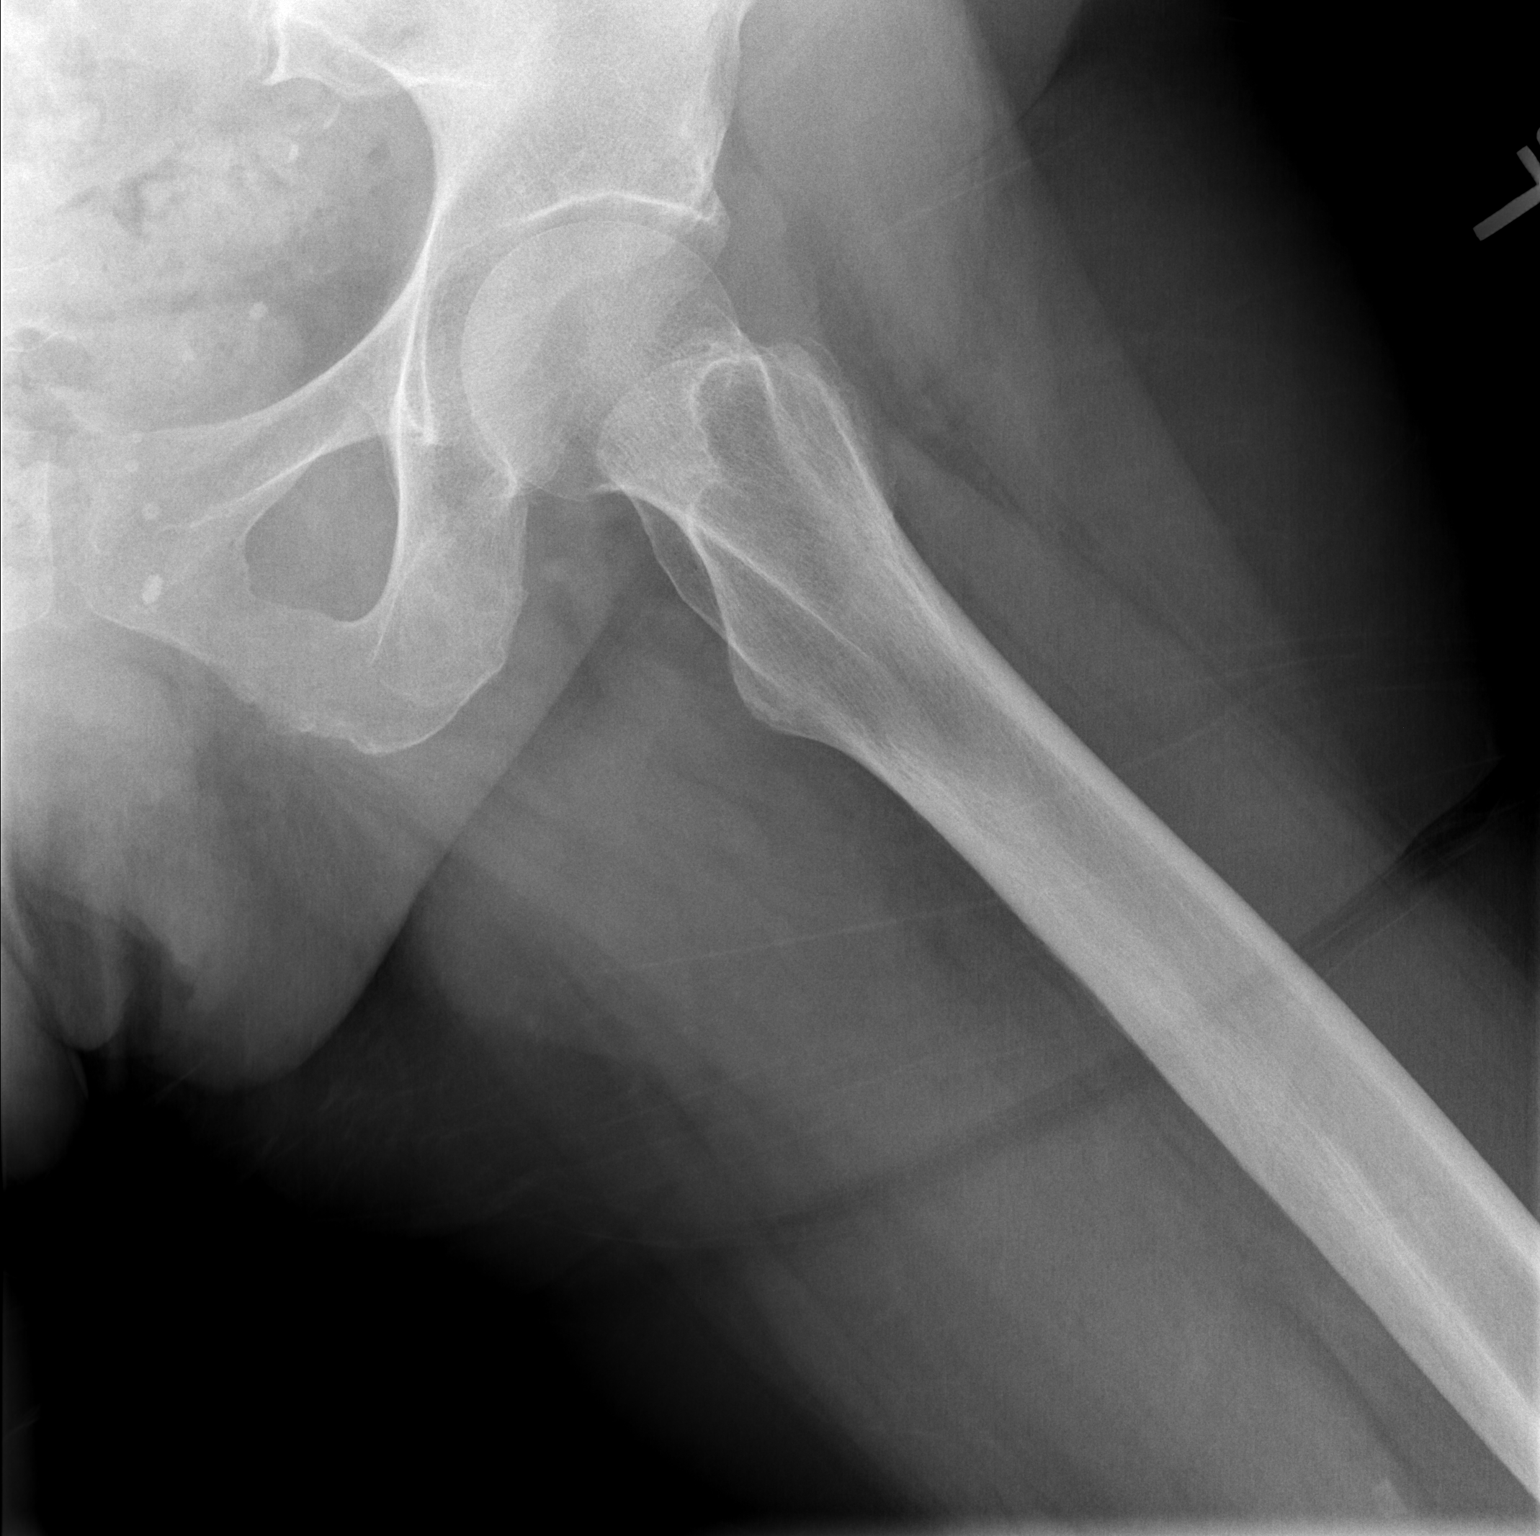

[2 of 2 positions shown; findings below may reference images not displayed]

FINDINGS: There is no evidence of hip fracture or dislocation. There is no
evidence of arthropathy or other focal bone abnormality.
IMPRESSION: Negative.

## 2022-01-28 DIAGNOSIS — Z4822 Encounter for aftercare following kidney transplant: Secondary | ICD-10-CM | POA: Diagnosis not present

## 2022-01-28 DIAGNOSIS — Z940 Kidney transplant status: Secondary | ICD-10-CM | POA: Diagnosis not present

## 2022-01-28 DIAGNOSIS — R7989 Other specified abnormal findings of blood chemistry: Secondary | ICD-10-CM | POA: Diagnosis not present

## 2022-02-03 DIAGNOSIS — Z79899 Other long term (current) drug therapy: Secondary | ICD-10-CM | POA: Diagnosis not present

## 2022-02-03 DIAGNOSIS — B259 Cytomegaloviral disease, unspecified: Secondary | ICD-10-CM | POA: Diagnosis not present

## 2022-02-03 DIAGNOSIS — E785 Hyperlipidemia, unspecified: Secondary | ICD-10-CM | POA: Diagnosis not present

## 2022-02-03 DIAGNOSIS — Z4822 Encounter for aftercare following kidney transplant: Secondary | ICD-10-CM | POA: Diagnosis not present

## 2022-02-03 DIAGNOSIS — Z940 Kidney transplant status: Secondary | ICD-10-CM | POA: Diagnosis not present

## 2022-02-03 DIAGNOSIS — D649 Anemia, unspecified: Secondary | ICD-10-CM | POA: Diagnosis not present

## 2022-02-03 DIAGNOSIS — D849 Immunodeficiency, unspecified: Secondary | ICD-10-CM | POA: Diagnosis not present

## 2022-02-03 DIAGNOSIS — Z792 Long term (current) use of antibiotics: Secondary | ICD-10-CM | POA: Diagnosis not present

## 2022-02-03 DIAGNOSIS — Z79621 Long term (current) use of calcineurin inhibitor: Secondary | ICD-10-CM | POA: Diagnosis not present

## 2022-02-03 DIAGNOSIS — E119 Type 2 diabetes mellitus without complications: Secondary | ICD-10-CM | POA: Diagnosis not present

## 2022-02-03 DIAGNOSIS — I10 Essential (primary) hypertension: Secondary | ICD-10-CM | POA: Diagnosis not present

## 2022-02-03 DIAGNOSIS — N022 Recurrent and persistent hematuria with diffuse membranous glomerulonephritis: Secondary | ICD-10-CM | POA: Diagnosis not present

## 2022-02-03 DIAGNOSIS — Z7952 Long term (current) use of systemic steroids: Secondary | ICD-10-CM | POA: Diagnosis not present

## 2022-02-03 DIAGNOSIS — D72819 Decreased white blood cell count, unspecified: Secondary | ICD-10-CM | POA: Diagnosis not present

## 2022-02-03 DIAGNOSIS — R809 Proteinuria, unspecified: Secondary | ICD-10-CM | POA: Diagnosis not present

## 2022-02-03 DIAGNOSIS — Z79624 Long term (current) use of inhibitors of nucleotide synthesis: Secondary | ICD-10-CM | POA: Diagnosis not present

## 2022-02-09 DIAGNOSIS — Z940 Kidney transplant status: Secondary | ICD-10-CM | POA: Diagnosis not present

## 2022-02-09 DIAGNOSIS — R809 Proteinuria, unspecified: Secondary | ICD-10-CM | POA: Diagnosis not present

## 2022-02-09 DIAGNOSIS — N022 Recurrent and persistent hematuria with diffuse membranous glomerulonephritis: Secondary | ICD-10-CM | POA: Diagnosis not present

## 2022-02-16 DIAGNOSIS — E210 Primary hyperparathyroidism: Secondary | ICD-10-CM | POA: Diagnosis not present

## 2022-02-16 DIAGNOSIS — E871 Hypo-osmolality and hyponatremia: Secondary | ICD-10-CM | POA: Diagnosis not present

## 2022-02-16 DIAGNOSIS — Z9889 Other specified postprocedural states: Secondary | ICD-10-CM | POA: Diagnosis not present

## 2022-02-16 DIAGNOSIS — D72819 Decreased white blood cell count, unspecified: Secondary | ICD-10-CM | POA: Diagnosis not present

## 2022-02-16 DIAGNOSIS — I10 Essential (primary) hypertension: Secondary | ICD-10-CM | POA: Diagnosis not present

## 2022-02-16 DIAGNOSIS — Z940 Kidney transplant status: Secondary | ICD-10-CM | POA: Diagnosis not present

## 2022-02-16 DIAGNOSIS — N022 Recurrent and persistent hematuria with diffuse membranous glomerulonephritis: Secondary | ICD-10-CM | POA: Diagnosis not present

## 2022-02-16 DIAGNOSIS — D649 Anemia, unspecified: Secondary | ICD-10-CM | POA: Diagnosis not present

## 2022-02-16 DIAGNOSIS — B259 Cytomegaloviral disease, unspecified: Secondary | ICD-10-CM | POA: Diagnosis not present

## 2022-02-16 DIAGNOSIS — Z9089 Acquired absence of other organs: Secondary | ICD-10-CM | POA: Diagnosis not present

## 2022-02-16 DIAGNOSIS — E119 Type 2 diabetes mellitus without complications: Secondary | ICD-10-CM | POA: Diagnosis not present

## 2022-02-16 DIAGNOSIS — Z4822 Encounter for aftercare following kidney transplant: Secondary | ICD-10-CM | POA: Diagnosis not present

## 2022-02-16 DIAGNOSIS — Z79621 Long term (current) use of calcineurin inhibitor: Secondary | ICD-10-CM | POA: Diagnosis not present

## 2022-02-16 DIAGNOSIS — R809 Proteinuria, unspecified: Secondary | ICD-10-CM | POA: Diagnosis not present

## 2022-02-16 DIAGNOSIS — C50919 Malignant neoplasm of unspecified site of unspecified female breast: Secondary | ICD-10-CM | POA: Diagnosis not present

## 2022-02-16 DIAGNOSIS — I151 Hypertension secondary to other renal disorders: Secondary | ICD-10-CM | POA: Diagnosis not present

## 2022-02-16 DIAGNOSIS — Z7952 Long term (current) use of systemic steroids: Secondary | ICD-10-CM | POA: Diagnosis not present

## 2022-02-16 DIAGNOSIS — Z79624 Long term (current) use of inhibitors of nucleotide synthesis: Secondary | ICD-10-CM | POA: Diagnosis not present

## 2022-02-16 DIAGNOSIS — Z792 Long term (current) use of antibiotics: Secondary | ICD-10-CM | POA: Diagnosis not present

## 2022-02-16 DIAGNOSIS — Z5181 Encounter for therapeutic drug level monitoring: Secondary | ICD-10-CM | POA: Diagnosis not present

## 2022-02-16 DIAGNOSIS — N179 Acute kidney failure, unspecified: Secondary | ICD-10-CM | POA: Diagnosis not present

## 2022-02-16 DIAGNOSIS — D849 Immunodeficiency, unspecified: Secondary | ICD-10-CM | POA: Diagnosis not present

## 2022-02-16 DIAGNOSIS — Z853 Personal history of malignant neoplasm of breast: Secondary | ICD-10-CM | POA: Diagnosis not present

## 2022-02-16 DIAGNOSIS — N2581 Secondary hyperparathyroidism of renal origin: Secondary | ICD-10-CM | POA: Diagnosis not present

## 2022-02-16 DIAGNOSIS — Z79899 Other long term (current) drug therapy: Secondary | ICD-10-CM | POA: Diagnosis not present

## 2022-02-16 DIAGNOSIS — E785 Hyperlipidemia, unspecified: Secondary | ICD-10-CM | POA: Diagnosis not present

## 2022-02-22 DIAGNOSIS — Z4822 Encounter for aftercare following kidney transplant: Secondary | ICD-10-CM | POA: Diagnosis not present

## 2022-02-22 DIAGNOSIS — Z940 Kidney transplant status: Secondary | ICD-10-CM | POA: Diagnosis not present

## 2022-02-22 DIAGNOSIS — N269 Renal sclerosis, unspecified: Secondary | ICD-10-CM | POA: Diagnosis not present

## 2022-02-22 DIAGNOSIS — I701 Atherosclerosis of renal artery: Secondary | ICD-10-CM | POA: Diagnosis not present

## 2022-02-22 DIAGNOSIS — T8619 Other complication of kidney transplant: Secondary | ICD-10-CM | POA: Diagnosis not present

## 2022-02-22 DIAGNOSIS — E1121 Type 2 diabetes mellitus with diabetic nephropathy: Secondary | ICD-10-CM | POA: Diagnosis not present

## 2022-02-22 DIAGNOSIS — N261 Atrophy of kidney (terminal): Secondary | ICD-10-CM | POA: Diagnosis not present

## 2022-02-24 DIAGNOSIS — Z9889 Other specified postprocedural states: Secondary | ICD-10-CM | POA: Diagnosis not present

## 2022-02-24 DIAGNOSIS — Z4822 Encounter for aftercare following kidney transplant: Secondary | ICD-10-CM | POA: Diagnosis not present

## 2022-02-24 DIAGNOSIS — Z940 Kidney transplant status: Secondary | ICD-10-CM | POA: Diagnosis not present

## 2022-02-24 DIAGNOSIS — N2889 Other specified disorders of kidney and ureter: Secondary | ICD-10-CM | POA: Diagnosis not present

## 2022-02-24 DIAGNOSIS — I10 Essential (primary) hypertension: Secondary | ICD-10-CM | POA: Diagnosis not present

## 2022-02-24 DIAGNOSIS — B259 Cytomegaloviral disease, unspecified: Secondary | ICD-10-CM | POA: Diagnosis not present

## 2022-02-24 DIAGNOSIS — D759 Disease of blood and blood-forming organs, unspecified: Secondary | ICD-10-CM | POA: Diagnosis not present

## 2022-02-24 DIAGNOSIS — Z79899 Other long term (current) drug therapy: Secondary | ICD-10-CM | POA: Diagnosis not present

## 2022-02-24 DIAGNOSIS — Z7952 Long term (current) use of systemic steroids: Secondary | ICD-10-CM | POA: Diagnosis not present

## 2022-02-24 DIAGNOSIS — K439 Ventral hernia without obstruction or gangrene: Secondary | ICD-10-CM | POA: Diagnosis not present

## 2022-02-24 DIAGNOSIS — D849 Immunodeficiency, unspecified: Secondary | ICD-10-CM | POA: Diagnosis not present

## 2022-02-24 DIAGNOSIS — R809 Proteinuria, unspecified: Secondary | ICD-10-CM | POA: Diagnosis not present

## 2022-02-24 DIAGNOSIS — R918 Other nonspecific abnormal finding of lung field: Secondary | ICD-10-CM | POA: Diagnosis not present

## 2022-02-24 DIAGNOSIS — Z79621 Long term (current) use of calcineurin inhibitor: Secondary | ICD-10-CM | POA: Diagnosis not present

## 2022-02-24 DIAGNOSIS — D472 Monoclonal gammopathy: Secondary | ICD-10-CM | POA: Diagnosis not present

## 2022-02-24 DIAGNOSIS — K7689 Other specified diseases of liver: Secondary | ICD-10-CM | POA: Diagnosis not present

## 2022-02-24 DIAGNOSIS — R634 Abnormal weight loss: Secondary | ICD-10-CM | POA: Diagnosis not present

## 2022-02-24 DIAGNOSIS — Z79624 Long term (current) use of inhibitors of nucleotide synthesis: Secondary | ICD-10-CM | POA: Diagnosis not present

## 2022-02-24 DIAGNOSIS — Z5181 Encounter for therapeutic drug level monitoring: Secondary | ICD-10-CM | POA: Diagnosis not present

## 2022-02-27 ENCOUNTER — Other Ambulatory Visit: Payer: Self-pay

## 2022-02-27 ENCOUNTER — Ambulatory Visit (HOSPITAL_COMMUNITY)
Admission: EM | Admit: 2022-02-27 | Discharge: 2022-02-27 | Disposition: A | Discharge status: Home / Self Care | Payer: Medicare Other | Attending: Internal Medicine | Admitting: Internal Medicine

## 2022-02-27 ENCOUNTER — Encounter (HOSPITAL_COMMUNITY): Payer: Self-pay | Admitting: *Deleted

## 2022-02-27 DIAGNOSIS — Z20822 Contact with and (suspected) exposure to covid-19: Secondary | ICD-10-CM | POA: Diagnosis not present

## 2022-02-27 DIAGNOSIS — J069 Acute upper respiratory infection, unspecified: Secondary | ICD-10-CM | POA: Diagnosis not present

## 2022-02-27 MED ORDER — FLUTICASONE PROPIONATE 50 MCG/ACT NA SUSP: 1.0000 | Freq: Every day | NASAL | 0 refills | Status: DC

## 2022-02-27 MED ORDER — BENZONATATE 100 MG PO CAPS: 100.0000 mg | ORAL_CAPSULE | Freq: Three times a day (TID) | ORAL | 0 refills | Status: DC

## 2022-02-27 NOTE — ED Triage Notes (Signed)
Pt reports her Husband had positive COVID last week and Pt now a cough and sore throat. Pt wants to be checked for COVID.

## 2022-02-27 NOTE — ED Provider Notes (Signed)
[facility]    CSN: [id number] Arrival date & time: 02/27/22  1003      History   Chief Complaint Chief Complaint  Patient presents with   Cough   Sore Throat    HPI Karen Myers is a 77 y.o. female.   Patient with significant past medical history of breast cancer, CKD stage 3, HLD, and HTN presents to urgent care for evaluation of dry cough, sore throat, and congestion that started 2 days ago. Her husband tested positive for COVID-19 last week and she believes she may have COVID, too. She has been vaccinated against COVID-19 and flu. Reports chills without known fever at home as well as generalized fatigue and generalized body aches. Cough is nagging and non productive. She denies chest pain, shortness of breath, and heart palpitations. She has not had any recent antibiotics or steroids.    Cough Sore Throat   Past Medical History:  Diagnosis Date   Arthritis    Breast cancer ([place])    left breast cancer    Breast cancer ([place])    Chronic kidney disease    CIN III (cervical intraepithelial neoplasia III)    prior to LEEP procedure   GERD (gastroesophageal reflux disease)    High cholesterol    Hypertension     Patient Active Problem List   Diagnosis Date Noted   Arthralgia of left lower leg 08/03/2020   Primary hyperparathyroidism ([place]) 12/07/2018   Hyperparathyroidism, primary ([place]) 05/27/2017   CIN 3 - cervical intraepithelial neoplasia grade 3 10/06/2011   Breast CA ([place]) 12/02/2010   Renal insufficiency 12/02/2010   Hypertension 12/02/2010   Hyperlipidemia 12/02/2010    Past Surgical History:  Procedure Laterality Date   BREAST LUMPECTOMY     CERVICAL BIOPSY  W/ LOOP ELECTRODE EXCISION  2005   CHOLECYSTECTOMY N/A 03/18/2021   Procedure: LAPAROSCOPIC CHOLECYSTECTOMY WITH ICG;  Surgeon: Ileana Roup, MD;  Location: [facility];  Service: General;  Laterality: N/A;   COLONOSCOPY     HYSTEROSCOPY WITH D & C  2005   MASTECTOMY  2002    bilateral    PARATHYROIDECTOMY N/A 06/01/2017   Procedure: PARATHYROIDECTOMY;  Surgeon: Armandina Gemma, MD;  Location: [facility];  Service: General;  Laterality: N/A;   PARATHYROIDECTOMY N/A 12/07/2018   Procedure: NECK EXPLORATION, RIGHT INFERIOR PARATHYROIDECTOMY, LEFT THYROID LOBECTOMY;  Surgeon: Armandina Gemma, MD;  Location: [facility];  Service: General;  Laterality: N/A;   POLYPECTOMY     RENAL BIOPSY     VULVA Milagros Loll BIOPSY     nevus on vulva     OB History     Gravida  2   Para  2   Term      Preterm      AB      Living  2      SAB      IAB      Ectopic      Multiple      Live Births               Home Medications    Prior to Admission medications   Medication Sig Start Date End Date Taking? Authorizing Provider  benzonatate (TESSALON) 100 MG capsule Take 1 capsule (100 mg total) by mouth every 8 (eight) hours. 02/27/22  Yes Talbot Grumbling, FNP  fluticasone (FLONASE) 50 MCG/ACT nasal spray Place 1 spray into both nostrils daily. 02/27/22  Yes Talbot Grumbling, FNP  acetaminophen (TYLENOL) 500 MG tablet  Take 1,000 mg by mouth 2 (two) times daily.    [provider]  amLODipine (NORVASC) 5 MG tablet Take 5 mg by mouth daily as needed. 08/30/21   [provider]  aspirin EC 81 MG tablet Take 81 mg by mouth daily.    [provider]  atorvastatin (LIPITOR) 40 MG tablet Take 40 mg by mouth daily.    [provider]  DILT-XR 240 MG 24 hr capsule Take 240 mg by mouth daily. 12/09/20   [provider]  docusate sodium (COLACE) 100 MG capsule Take 1 capsule (100 mg total) by mouth daily as needed for mild constipation or moderate constipation. 03/18/21 03/18/22  Winferd Humphrey, PA-C  FA-B6-B12-D-Omega 3-Phytoster (ANIMI-3/VITAMIN D PO) Take 1 tablet by mouth daily.    [provider]  mycophenolate (MYFORTIC) 180 MG EC tablet Take 180 mg by mouth in the morning and at bedtime. 08/13/21   [provider]   NIFEdipine (PROCARDIA-XL/NIFEDICAL-XL) 30 MG 24 hr tablet Take 30 mg by mouth daily. 08/04/21   [provider]  pantoprazole (PROTONIX) 40 MG tablet Take 40 mg by mouth daily. 08/31/21   [provider]  polyethylene glycol (MIRALAX / GLYCOLAX) 17 g packet Take 17 g by mouth as needed. 08/04/21   [provider]  predniSONE (DELTASONE) 5 MG tablet Take 10 mg by mouth 2 (two) times daily. 08/31/21   [provider]  senna-docusate (SENOKOT-S) 8.6-50 MG tablet Take 1 tablet by mouth as needed. 08/04/21   [provider]  sulfamethoxazole-trimethoprim (BACTRIM) 400-80 MG tablet Take 1 tablet by mouth daily. 08/04/21   [provider]  tacrolimus (PROGRAF) 1 MG capsule Take 1 mg by mouth in the morning and at bedtime. 08/23/21   [provider]  trimethoprim-polymyxin b (POLYTRIM) ophthalmic solution Place 1 drop into both eyes every 6 (six) hours. 09/01/21   Nyoka Lint, PA-C  valGANciclovir (VALCYTE) 450 MG tablet Take 450 mg by mouth daily. 08/04/21   [provider]  losartan (COZAAR) 100 MG tablet Take 100 mg by mouth daily.  03/07/20  [provider]    Family History History reviewed. No pertinent family history.  Social History Social History   Tobacco Use   Smoking status: Former   Smokeless tobacco: Never  Scientific laboratory technician Use: Never used  Substance Use Topics   Alcohol use: No   Drug use: No     Allergies   Altace [ramipril], Lisinopril, and Losartan   Review of Systems Review of Systems  Respiratory:  Positive for cough.    Per HPI  Physical Exam Triage Vital Signs ED Triage Vitals  Enc Vitals Group     BP 02/27/22 1026 117/70     Pulse Rate 02/27/22 1026 87     Resp 02/27/22 1026 18     Temp 02/27/22 1026 98.4 F (36.9 C)     Temp src --      SpO2 02/27/22 1026 98 %     Weight --      Height --      Head Circumference --      Peak Flow --      Pain Score 02/27/22 1024 10      Pain Loc --      Pain Edu? --      Excl. in [place]? --    No data found.  Updated Vital Signs BP 117/70   Pulse 87   Temp 98.4 F (36.9 C)  Resp 18   SpO2 98%   Visual Acuity Right Eye Distance:   Left Eye Distance:   Bilateral Distance:    Right Eye Near:   Left Eye Near:    Bilateral Near:     Physical Exam Vitals and nursing note reviewed.  Constitutional:      Appearance: She is not ill-appearing or toxic-appearing.  HENT:     Head: Normocephalic and atraumatic.     Right Ear: Hearing, tympanic membrane, ear canal and external ear normal.     Left Ear: Hearing, tympanic membrane, ear canal and external ear normal.     Nose: Rhinorrhea present.     Mouth/Throat:     Lips: Pink.     Mouth: Mucous membranes are moist.     Tonsils: No tonsillar exudate or tonsillar abscesses. 0 on the right. 0 on the left.  Eyes:     General: Lids are normal. Vision grossly intact. Gaze aligned appropriately.     Extraocular Movements: Extraocular movements intact.     Conjunctiva/sclera: Conjunctivae normal.  Cardiovascular:     Rate and Rhythm: Normal rate and regular rhythm.     Heart sounds: Normal heart sounds, S1 normal and S2 normal.  Pulmonary:     Effort: Pulmonary effort is normal. No respiratory distress.     Breath sounds: Normal breath sounds and air entry. No stridor. No wheezing, rhonchi or rales.  Chest:     Chest wall: No tenderness.  Abdominal:     General: Bowel sounds are normal.     Palpations: Abdomen is soft.     Tenderness: There is no abdominal tenderness. There is no right CVA tenderness, left CVA tenderness or guarding.  Musculoskeletal:     Cervical back: Neck supple.  Skin:    General: Skin is warm and dry.     Capillary Refill: Capillary refill takes less than 2 seconds.     Findings: No rash.  Neurological:     General: No focal deficit present.     Mental Status: She is alert and oriented to person, place, and time. Mental status is at baseline.      Cranial Nerves: No dysarthria or facial asymmetry.  Psychiatric:        Mood and Affect: Mood normal.        Speech: Speech normal.        Behavior: Behavior normal.        Thought Content: Thought content normal.        Judgment: Judgment normal.      UC Treatments / Results  Labs (all labs ordered are listed, but only abnormal results are displayed) COVID-19 testing  EKG   Radiology No results found.  Procedures Procedures (including critical care time)  Medications Ordered in UC Medications - No data to display  Initial Impression / Assessment and Plan / UC Course  I have reviewed the triage vital signs and the nursing notes.  Pertinent labs & imaging results that were available during my care of the patient were reviewed by me and considered in my medical decision making (see chart for details).   1. Viral URI with cough, exposure to COVID-19 Symptoms and physical exam consistent with a viral upper respiratory tract infection that will likely resolve with rest, fluids, and prescriptions for symptomatic relief. Deferred imaging based on stable cardiopulmonary exam and hemodynamically stable vital signs. COVID-19 testing is pending.  We will call patient if this is positive.  Quarantine guidelines discussed. Currently on  day 3 of symptoms and does qualify for antiviral therapy. She is nontoxic in appearance and appears well hydrated.  Tessalon perles and flonase sent to pharmacy for symptomatic relief to be taken as prescribed.  May continue taking over the counter medications as directed for further symptomatic relief.    Nonpharmacologic interventions for symptom relief provided and after visit summary below. Advised to push fluids to stay well hydrated while recovering from viral illness.   Discussed physical exam and available lab work findings in clinic with patient.  Counseled patient regarding appropriate use of medications and potential side effects for all  medications recommended or prescribed today. Discussed red flag signs and symptoms of worsening condition,when to call the PCP office, return to urgent care, and when to seek higher level of care in the emergency department. Patient verbalizes understanding and agreement with plan. All questions answered. Patient discharged in stable condition.    Final Clinical Impressions(s) / UC Diagnoses   Final diagnoses:  Viral URI with cough  Exposure to COVID-19 virus     Discharge Instructions      You have a viral upper respiratory infection.  COVID-19 testing is pending. We will call you with results if positive. If your COVID test is positive, you must stay at home until day 6 of symptoms. On day 6, you may go out into public and go back to work, but you must wear a mask until day 11 of symptoms to prevent spread to others.  Use the following medicines to help with symptoms: - Flonase intranasal spray every 24 hours (1 spray into each nostril) to help reduce inflammation and runny nose - Tylenol 1,'000mg'$  every 6 hours with food as needed for aches/pains or fever/chills.  - Tessalon perles every 8 hours as needed for cough.  1 tablespoon of honey in warm water and/or salt water gargles may also help with symptoms. Humidifier to your room will help add water to the air and reduce coughing.  If you develop any new or worsening symptoms, please return.  If your symptoms are severe, please go to the emergency room.  Follow-up with your primary care provider for further evaluation and management of your symptoms as well as ongoing wellness visits.  I hope you feel better!     ED Prescriptions     Medication Sig Dispense Auth. Provider   benzonatate (TESSALON) 100 MG capsule Take 1 capsule (100 mg total) by mouth every 8 (eight) hours. 21 capsule Joella Prince M, FNP   fluticasone [facility]) 50 MCG/ACT nasal spray Place 1 spray into both nostrils daily. 11.1 mL Talbot Grumbling, FNP       PDMP not reviewed this encounter.   Talbot Grumbling, [place] 03/06/22 1528

## 2022-02-27 NOTE — Discharge Instructions (Addendum)
You have a viral upper respiratory infection.  COVID-19 testing is pending. We will call you with results if positive. If your COVID test is positive, you must stay at home until day 6 of symptoms. On day 6, you may go out into public and go back to work, but you must wear a mask until day 11 of symptoms to prevent spread to others.  Use the following medicines to help with symptoms: - Flonase intranasal spray every 24 hours (1 spray into each nostril) to help reduce inflammation and runny nose - Tylenol 1,'000mg'$  every 6 hours with food as needed for aches/pains or fever/chills.  - Tessalon perles every 8 hours as needed for cough.  1 tablespoon of honey in warm water and/or salt water gargles may also help with symptoms. Humidifier to your room will help add water to the air and reduce coughing.  If you develop any new or worsening symptoms, please return.  If your symptoms are severe, please go to the emergency room.  Follow-up with your primary care provider for further evaluation and management of your symptoms as well as ongoing wellness visits.  I hope you feel better!

## 2022-02-28 ENCOUNTER — Ambulatory Visit (HOSPITAL_COMMUNITY)
Admission: EM | Admit: 2022-02-28 | Discharge: 2022-02-28 | Disposition: A | Discharge status: Home / Self Care | Payer: Medicare Other | Attending: Emergency Medicine | Admitting: Emergency Medicine

## 2022-02-28 ENCOUNTER — Encounter (HOSPITAL_COMMUNITY): Payer: Self-pay | Admitting: Emergency Medicine

## 2022-02-28 DIAGNOSIS — U071 COVID-19: Secondary | ICD-10-CM

## 2022-02-28 DIAGNOSIS — H938X1 Other specified disorders of right ear: Secondary | ICD-10-CM

## 2022-02-28 LAB — SARS CORONAVIRUS 2 (TAT 6-24 HRS): SARS Coronavirus 2: POSITIVE — AB

## 2022-02-28 MED ORDER — GUAIFENESIN ER 600 MG PO TB12: 600.0000 mg | ORAL_TABLET | Freq: Two times a day (BID) | ORAL | 0 refills | Status: AC

## 2022-02-28 NOTE — Discharge Instructions (Addendum)
Continue flonase (nasal spray) daily This can help reduce ear pressure/pain  You can add the mucinex to reduce congestion and help with cough. Take this medication with lots of water!

## 2022-02-28 NOTE — ED Provider Notes (Signed)
[facility]    CSN: [id number] Arrival date & time: 02/28/22  0941      History   Chief Complaint Chief Complaint  Patient presents with   Otalgia    HPI Karen Myers is a 77 y.o. female. Seen here yesterday, had positive covid test Here today for right ear pain. Started yesterday 10/10 pain. No drainage  She would like to discuss antivirals as well Hx ESRD, kidney transplant. Not on dialysis   Past Medical History:  Diagnosis Date   Arthritis    Breast cancer ([place])    left breast cancer    Breast cancer ([place])    Chronic kidney disease    CIN III (cervical intraepithelial neoplasia III)    prior to LEEP procedure   GERD (gastroesophageal reflux disease)    High cholesterol    Hypertension     Patient Active Problem List   Diagnosis Date Noted   Arthralgia of left lower leg 08/03/2020   Primary hyperparathyroidism ([place]) 12/07/2018   Hyperparathyroidism, primary ([place]) 05/27/2017   CIN 3 - cervical intraepithelial neoplasia grade 3 10/06/2011   Breast CA ([place]) 12/02/2010   Renal insufficiency 12/02/2010   Hypertension 12/02/2010   Hyperlipidemia 12/02/2010    Past Surgical History:  Procedure Laterality Date   BREAST LUMPECTOMY     CERVICAL BIOPSY  W/ LOOP ELECTRODE EXCISION  2005   CHOLECYSTECTOMY N/A 03/18/2021   Procedure: LAPAROSCOPIC CHOLECYSTECTOMY WITH ICG;  Surgeon: Ileana Roup, MD;  Location: [facility];  Service: General;  Laterality: N/A;   COLONOSCOPY     HYSTEROSCOPY WITH D & C  2005   MASTECTOMY  2002   bilateral    PARATHYROIDECTOMY N/A 06/01/2017   Procedure: PARATHYROIDECTOMY;  Surgeon: Armandina Gemma, MD;  [facility];  Service: General;  Laterality: N/A;   PARATHYROIDECTOMY N/A 12/07/2018   Procedure: NECK EXPLORATION, RIGHT INFERIOR PARATHYROIDECTOMY, LEFT THYROID LOBECTOMY;  Surgeon: Armandina Gemma, MD;  Location[facility];  Service: General;  Laterality: N/A;   POLYPECTOMY     RENAL BIOPSY     VULVA Milagros Loll  BIOPSY     nevus on vulva     OB History     Gravida  2   Para  2   Term      Preterm      AB      Living  2      SAB      IAB      Ectopic      Multiple      Live Births               Home Medications    Prior to Admission medications   Medication Sig Start Date End Date Taking? Authorizing Provider  guaiFENesin (MUCINEX) 600 MG 12 hr tablet Take 1 tablet (600 mg total) by mouth 2 (two) times daily for 5 days. 02/28/22 03/05/22 Yes Jene Oravec, Wells Guiles, PA-C  acetaminophen (TYLENOL) 500 MG tablet Take 1,000 mg by mouth 2 (two) times daily.    [provider]  amLODipine (NORVASC) 5 MG tablet Take 5 mg by mouth daily as needed. 08/30/21   [provider]  aspirin EC 81 MG tablet Take 81 mg by mouth daily.    [provider]  atorvastatin (LIPITOR) 40 MG tablet Take 40 mg by mouth daily.    [provider]  benzonatate (TESSALON) 100 MG capsule Take 1 capsule (100 mg total) by mouth every 8 (eight) hours. 02/27/22  Talbot Grumbling, FNP  DILT-XR 240 MG 24 hr capsule Take 240 mg by mouth daily. 12/09/20   [provider]  docusate sodium (COLACE) 100 MG capsule Take 1 capsule (100 mg total) by mouth daily as needed for mild constipation or moderate constipation. 03/18/21 03/18/22  Winferd Humphrey, PA-C  FA-B6-B12-D-Omega 3-Phytoster (ANIMI-3/VITAMIN D PO) Take 1 tablet by mouth daily.    [provider]  fluticasone (FLONASE) 50 MCG/ACT nasal spray Place 1 spray into both nostrils daily. 02/27/22   Talbot Grumbling, FNP  mycophenolate (MYFORTIC) 180 MG EC tablet Take 180 mg by mouth in the morning and at bedtime. 08/13/21   [provider]  NIFEdipine (PROCARDIA-XL/NIFEDICAL-XL) 30 MG 24 hr tablet Take 30 mg by mouth daily. 08/04/21   [provider]  pantoprazole (PROTONIX) 40 MG tablet Take 40 mg by mouth daily. 08/31/21   [provider]  polyethylene glycol (MIRALAX / GLYCOLAX) 17 g  packet Take 17 g by mouth as needed. 08/04/21   [provider]  predniSONE (DELTASONE) 5 MG tablet Take 10 mg by mouth 2 (two) times daily. 08/31/21   [provider]  senna-docusate (SENOKOT-S) 8.6-50 MG tablet Take 1 tablet by mouth as needed. 08/04/21   [provider]  sulfamethoxazole-trimethoprim (BACTRIM) 400-80 MG tablet Take 1 tablet by mouth daily. 08/04/21   [provider]  tacrolimus (PROGRAF) 1 MG capsule Take 1 mg by mouth in the morning and at bedtime. 08/23/21   [provider]  trimethoprim-polymyxin b (POLYTRIM) ophthalmic solution Place 1 drop into both eyes every 6 (six) hours. 09/01/21   Nyoka Lint, PA-C  valGANciclovir (VALCYTE) 450 MG tablet Take 450 mg by mouth daily. 08/04/21   [provider]  losartan (COZAAR) 100 MG tablet Take 100 mg by mouth daily.  03/07/20  [provider]    Family History No family history on file.  Social History Social History   Tobacco Use   Smoking status: Former   Smokeless tobacco: Never  Scientific laboratory technician Use: Never used  Substance Use Topics   Alcohol use: No   Drug use: No     Allergies   Altace [ramipril], Lisinopril, and Losartan   Review of Systems Review of Systems As per HPI  Physical Exam Triage Vital Signs ED Triage Vitals  Enc Vitals Group     BP 02/28/22 1111 124/74     Pulse Rate 02/28/22 1111 87     Resp 02/28/22 1111 17     Temp 02/28/22 1111 98.1 F (36.7 C)     Temp Source 02/28/22 1111 Oral     SpO2 02/28/22 1111 96 %     Weight --      Height --      Head Circumference --      Peak Flow --      Pain Score 02/28/22 1110 10     Pain Loc --      Pain Edu? --      Excl. in [place]? --    No data found.  Updated Vital Signs BP 124/74 (BP Location: Right Arm)   Pulse 87   Temp 98.1 F (36.7 C) (Oral)   Resp 17   SpO2 96%     Physical Exam Vitals and nursing note reviewed.  Constitutional:      General: She is not in acute  distress. HENT:     Right Ear: Tympanic membrane, ear canal and external ear normal.  Left Ear: Tympanic membrane, ear canal and external ear normal.     Nose: Congestion and rhinorrhea present.     Mouth/Throat:     Pharynx: Oropharynx is clear.  Cardiovascular:     Rate and Rhythm: Normal rate and regular rhythm.     Pulses: Normal pulses.     Heart sounds: Normal heart sounds.  Pulmonary:     Effort: Pulmonary effort is normal.     Breath sounds: Normal breath sounds.  Musculoskeletal:     Cervical back: Normal range of motion.  Lymphadenopathy:     Cervical: No cervical adenopathy.  Neurological:     Mental Status: She is alert and oriented to person, place, and time.     UC Treatments / Results  Labs (all labs ordered are listed, but only abnormal results are displayed) Labs Reviewed - No data to display  EKG  Radiology No results found.  Procedures Procedures   Medications Ordered in UC Medications - No data to display  Initial Impression / Assessment and Plan / UC Course  I have reviewed the triage vital signs and the nursing notes.  Pertinent labs & imaging results that were available during my care of the patient were reviewed by me and considered in my medical decision making (see chart for details).  Covid positive. GFR is 12 We discussed she is not a candidate for Paxlovid given her kidney function. She does qualify for Molnupiravir, although we discussed this medication can be very expensive. She spoke with her PCP and does not want to take this medication  No sign of ear infection on exam. Likely related to sinus pressure/congestion. Continue symptomatic care. Add mucinex BID with fluids Return precautions discussed. Patient agrees to plan  Final Clinical Impressions(s) / UC Diagnoses   Final diagnoses:  Lab test positive for detection of COVID-19 virus  Ear pressure, right     Discharge Instructions      Continue flonase (nasal spray)  daily This can help reduce ear pressure/pain  You can add the mucinex to reduce congestion and help with cough. Take this medication with lots of water!     ED Prescriptions     Medication Sig Dispense Auth. Provider   guaiFENesin (MUCINEX) 600 MG 12 hr tablet Take 1 tablet (600 mg total) by mouth 2 (two) times daily for 5 days. 10 tablet Ledarrius Beauchaine, Wells Guiles, PA-C      PDMP not reviewed this encounter.   Catalena Stanhope, Vernice [place] 02/28/22 1222

## 2022-02-28 NOTE — ED Triage Notes (Signed)
Pt c/o right ear pain that started yesterday.  Pt was seen here yesterday and called to let know covid+.

## 2022-03-04 DIAGNOSIS — Z5181 Encounter for therapeutic drug level monitoring: Secondary | ICD-10-CM | POA: Diagnosis not present

## 2022-03-04 DIAGNOSIS — Z853 Personal history of malignant neoplasm of breast: Secondary | ICD-10-CM | POA: Diagnosis not present

## 2022-03-04 DIAGNOSIS — D849 Immunodeficiency, unspecified: Secondary | ICD-10-CM | POA: Diagnosis not present

## 2022-03-04 DIAGNOSIS — Z9013 Acquired absence of bilateral breasts and nipples: Secondary | ICD-10-CM | POA: Diagnosis not present

## 2022-03-04 DIAGNOSIS — I120 Hypertensive chronic kidney disease with stage 5 chronic kidney disease or end stage renal disease: Secondary | ICD-10-CM | POA: Diagnosis not present

## 2022-03-04 DIAGNOSIS — I10 Essential (primary) hypertension: Secondary | ICD-10-CM | POA: Diagnosis not present

## 2022-03-04 DIAGNOSIS — Z4822 Encounter for aftercare following kidney transplant: Secondary | ICD-10-CM | POA: Diagnosis not present

## 2022-03-04 DIAGNOSIS — N186 End stage renal disease: Secondary | ICD-10-CM | POA: Diagnosis not present

## 2022-03-04 DIAGNOSIS — U071 COVID-19: Secondary | ICD-10-CM | POA: Diagnosis not present

## 2022-03-04 DIAGNOSIS — N179 Acute kidney failure, unspecified: Secondary | ICD-10-CM | POA: Diagnosis not present

## 2022-03-04 DIAGNOSIS — T8611 Kidney transplant rejection: Secondary | ICD-10-CM | POA: Diagnosis not present

## 2022-03-04 DIAGNOSIS — R059 Cough, unspecified: Secondary | ICD-10-CM | POA: Diagnosis not present

## 2022-03-04 DIAGNOSIS — D631 Anemia in chronic kidney disease: Secondary | ICD-10-CM | POA: Diagnosis not present

## 2022-03-04 DIAGNOSIS — Z79899 Other long term (current) drug therapy: Secondary | ICD-10-CM | POA: Diagnosis not present

## 2022-03-04 DIAGNOSIS — Z940 Kidney transplant status: Secondary | ICD-10-CM | POA: Diagnosis not present

## 2022-03-07 DIAGNOSIS — Z5181 Encounter for therapeutic drug level monitoring: Secondary | ICD-10-CM | POA: Diagnosis not present

## 2022-03-07 DIAGNOSIS — Z4822 Encounter for aftercare following kidney transplant: Secondary | ICD-10-CM | POA: Diagnosis not present

## 2022-03-07 DIAGNOSIS — Z79621 Long term (current) use of calcineurin inhibitor: Secondary | ICD-10-CM | POA: Diagnosis not present

## 2022-03-09 DIAGNOSIS — Z4822 Encounter for aftercare following kidney transplant: Secondary | ICD-10-CM | POA: Diagnosis not present

## 2022-03-09 DIAGNOSIS — Z79899 Other long term (current) drug therapy: Secondary | ICD-10-CM | POA: Diagnosis not present

## 2022-03-09 DIAGNOSIS — E892 Postprocedural hypoparathyroidism: Secondary | ICD-10-CM | POA: Diagnosis not present

## 2022-03-09 DIAGNOSIS — T8611 Kidney transplant rejection: Secondary | ICD-10-CM | POA: Diagnosis not present

## 2022-03-09 DIAGNOSIS — D649 Anemia, unspecified: Secondary | ICD-10-CM | POA: Diagnosis not present

## 2022-03-09 DIAGNOSIS — Z79621 Long term (current) use of calcineurin inhibitor: Secondary | ICD-10-CM | POA: Diagnosis not present

## 2022-03-09 DIAGNOSIS — U071 COVID-19: Secondary | ICD-10-CM | POA: Diagnosis not present

## 2022-03-09 DIAGNOSIS — Z8616 Personal history of COVID-19: Secondary | ICD-10-CM | POA: Diagnosis not present

## 2022-03-09 DIAGNOSIS — E785 Hyperlipidemia, unspecified: Secondary | ICD-10-CM | POA: Diagnosis not present

## 2022-03-09 DIAGNOSIS — R634 Abnormal weight loss: Secondary | ICD-10-CM | POA: Diagnosis not present

## 2022-03-09 DIAGNOSIS — D72819 Decreased white blood cell count, unspecified: Secondary | ICD-10-CM | POA: Diagnosis not present

## 2022-03-09 DIAGNOSIS — D84821 Immunodeficiency due to drugs: Secondary | ICD-10-CM | POA: Diagnosis not present

## 2022-03-09 DIAGNOSIS — Z5181 Encounter for therapeutic drug level monitoring: Secondary | ICD-10-CM | POA: Diagnosis not present

## 2022-03-09 DIAGNOSIS — E119 Type 2 diabetes mellitus without complications: Secondary | ICD-10-CM | POA: Diagnosis not present

## 2022-03-09 DIAGNOSIS — B259 Cytomegaloviral disease, unspecified: Secondary | ICD-10-CM | POA: Diagnosis not present

## 2022-03-09 DIAGNOSIS — I10 Essential (primary) hypertension: Secondary | ICD-10-CM | POA: Diagnosis not present

## 2022-03-14 DIAGNOSIS — R809 Proteinuria, unspecified: Secondary | ICD-10-CM | POA: Diagnosis not present

## 2022-03-14 DIAGNOSIS — D472 Monoclonal gammopathy: Secondary | ICD-10-CM | POA: Diagnosis not present

## 2022-03-14 DIAGNOSIS — Z8616 Personal history of COVID-19: Secondary | ICD-10-CM | POA: Diagnosis not present

## 2022-03-14 DIAGNOSIS — Z86000 Personal history of in-situ neoplasm of breast: Secondary | ICD-10-CM | POA: Diagnosis not present

## 2022-03-14 DIAGNOSIS — Z6822 Body mass index (BMI) 22.0-22.9, adult: Secondary | ICD-10-CM | POA: Diagnosis not present

## 2022-03-14 DIAGNOSIS — E559 Vitamin D deficiency, unspecified: Secondary | ICD-10-CM | POA: Diagnosis not present

## 2022-03-14 DIAGNOSIS — Z853 Personal history of malignant neoplasm of breast: Secondary | ICD-10-CM | POA: Diagnosis not present

## 2022-03-14 DIAGNOSIS — R634 Abnormal weight loss: Secondary | ICD-10-CM | POA: Diagnosis not present

## 2022-03-14 DIAGNOSIS — D61818 Other pancytopenia: Secondary | ICD-10-CM | POA: Diagnosis not present

## 2022-03-14 DIAGNOSIS — Z9221 Personal history of antineoplastic chemotherapy: Secondary | ICD-10-CM | POA: Diagnosis not present

## 2022-03-14 DIAGNOSIS — D649 Anemia, unspecified: Secondary | ICD-10-CM | POA: Diagnosis not present

## 2022-03-14 DIAGNOSIS — R7989 Other specified abnormal findings of blood chemistry: Secondary | ICD-10-CM | POA: Diagnosis not present

## 2022-03-14 DIAGNOSIS — D75839 Thrombocytosis, unspecified: Secondary | ICD-10-CM | POA: Diagnosis not present

## 2022-03-14 DIAGNOSIS — Z9013 Acquired absence of bilateral breasts and nipples: Secondary | ICD-10-CM | POA: Diagnosis not present

## 2022-03-14 DIAGNOSIS — B259 Cytomegaloviral disease, unspecified: Secondary | ICD-10-CM | POA: Diagnosis not present

## 2022-03-14 DIAGNOSIS — N184 Chronic kidney disease, stage 4 (severe): Secondary | ICD-10-CM | POA: Diagnosis not present

## 2022-03-14 DIAGNOSIS — Z940 Kidney transplant status: Secondary | ICD-10-CM | POA: Diagnosis not present

## 2022-03-16 DIAGNOSIS — Z853 Personal history of malignant neoplasm of breast: Secondary | ICD-10-CM | POA: Diagnosis not present

## 2022-03-16 DIAGNOSIS — N179 Acute kidney failure, unspecified: Secondary | ICD-10-CM | POA: Diagnosis not present

## 2022-03-16 DIAGNOSIS — B259 Cytomegaloviral disease, unspecified: Secondary | ICD-10-CM | POA: Diagnosis not present

## 2022-03-16 DIAGNOSIS — R54 Age-related physical debility: Secondary | ICD-10-CM | POA: Diagnosis not present

## 2022-03-16 DIAGNOSIS — E139 Other specified diabetes mellitus without complications: Secondary | ICD-10-CM | POA: Diagnosis not present

## 2022-03-16 DIAGNOSIS — R634 Abnormal weight loss: Secondary | ICD-10-CM | POA: Diagnosis not present

## 2022-03-16 DIAGNOSIS — Z79621 Long term (current) use of calcineurin inhibitor: Secondary | ICD-10-CM | POA: Diagnosis not present

## 2022-03-16 DIAGNOSIS — E871 Hypo-osmolality and hyponatremia: Secondary | ICD-10-CM | POA: Diagnosis not present

## 2022-03-16 DIAGNOSIS — E213 Hyperparathyroidism, unspecified: Secondary | ICD-10-CM | POA: Diagnosis not present

## 2022-03-16 DIAGNOSIS — I10 Essential (primary) hypertension: Secondary | ICD-10-CM | POA: Diagnosis not present

## 2022-03-16 DIAGNOSIS — I151 Hypertension secondary to other renal disorders: Secondary | ICD-10-CM | POA: Diagnosis not present

## 2022-03-16 DIAGNOSIS — Z79624 Long term (current) use of inhibitors of nucleotide synthesis: Secondary | ICD-10-CM | POA: Diagnosis not present

## 2022-03-16 DIAGNOSIS — Z7952 Long term (current) use of systemic steroids: Secondary | ICD-10-CM | POA: Diagnosis not present

## 2022-03-16 DIAGNOSIS — Z940 Kidney transplant status: Secondary | ICD-10-CM | POA: Diagnosis not present

## 2022-03-16 DIAGNOSIS — D649 Anemia, unspecified: Secondary | ICD-10-CM | POA: Diagnosis not present

## 2022-03-16 DIAGNOSIS — D72819 Decreased white blood cell count, unspecified: Secondary | ICD-10-CM | POA: Diagnosis not present

## 2022-03-16 DIAGNOSIS — N2581 Secondary hyperparathyroidism of renal origin: Secondary | ICD-10-CM | POA: Diagnosis not present

## 2022-03-16 DIAGNOSIS — E118 Type 2 diabetes mellitus with unspecified complications: Secondary | ICD-10-CM | POA: Diagnosis not present

## 2022-03-16 DIAGNOSIS — Z4822 Encounter for aftercare following kidney transplant: Secondary | ICD-10-CM | POA: Diagnosis not present

## 2022-03-16 DIAGNOSIS — E785 Hyperlipidemia, unspecified: Secondary | ICD-10-CM | POA: Diagnosis not present

## 2022-03-16 DIAGNOSIS — Z792 Long term (current) use of antibiotics: Secondary | ICD-10-CM | POA: Diagnosis not present

## 2022-03-30 DIAGNOSIS — D472 Monoclonal gammopathy: Secondary | ICD-10-CM | POA: Diagnosis not present

## 2022-03-30 DIAGNOSIS — U071 COVID-19: Secondary | ICD-10-CM | POA: Diagnosis not present

## 2022-03-30 DIAGNOSIS — E119 Type 2 diabetes mellitus without complications: Secondary | ICD-10-CM | POA: Diagnosis not present

## 2022-03-30 DIAGNOSIS — D649 Anemia, unspecified: Secondary | ICD-10-CM | POA: Diagnosis not present

## 2022-03-30 DIAGNOSIS — D72819 Decreased white blood cell count, unspecified: Secondary | ICD-10-CM | POA: Diagnosis not present

## 2022-03-30 DIAGNOSIS — E785 Hyperlipidemia, unspecified: Secondary | ICD-10-CM | POA: Diagnosis not present

## 2022-03-30 DIAGNOSIS — Z79621 Long term (current) use of calcineurin inhibitor: Secondary | ICD-10-CM | POA: Diagnosis not present

## 2022-03-30 DIAGNOSIS — D849 Immunodeficiency, unspecified: Secondary | ICD-10-CM | POA: Diagnosis not present

## 2022-03-30 DIAGNOSIS — I10 Essential (primary) hypertension: Secondary | ICD-10-CM | POA: Diagnosis not present

## 2022-03-30 DIAGNOSIS — C50011 Malignant neoplasm of nipple and areola, right female breast: Secondary | ICD-10-CM | POA: Diagnosis not present

## 2022-03-30 DIAGNOSIS — Z5181 Encounter for therapeutic drug level monitoring: Secondary | ICD-10-CM | POA: Diagnosis not present

## 2022-03-30 DIAGNOSIS — Z792 Long term (current) use of antibiotics: Secondary | ICD-10-CM | POA: Diagnosis not present

## 2022-03-30 DIAGNOSIS — Z7952 Long term (current) use of systemic steroids: Secondary | ICD-10-CM | POA: Diagnosis not present

## 2022-03-30 DIAGNOSIS — Z940 Kidney transplant status: Secondary | ICD-10-CM | POA: Diagnosis not present

## 2022-03-30 DIAGNOSIS — N185 Chronic kidney disease, stage 5: Secondary | ICD-10-CM | POA: Diagnosis not present

## 2022-03-30 DIAGNOSIS — I151 Hypertension secondary to other renal disorders: Secondary | ICD-10-CM | POA: Diagnosis not present

## 2022-03-30 DIAGNOSIS — Z4822 Encounter for aftercare following kidney transplant: Secondary | ICD-10-CM | POA: Diagnosis not present

## 2022-04-13 DIAGNOSIS — B259 Cytomegaloviral disease, unspecified: Secondary | ICD-10-CM | POA: Diagnosis not present

## 2022-04-13 DIAGNOSIS — D849 Immunodeficiency, unspecified: Secondary | ICD-10-CM | POA: Diagnosis not present

## 2022-04-13 DIAGNOSIS — Z5181 Encounter for therapeutic drug level monitoring: Secondary | ICD-10-CM | POA: Diagnosis not present

## 2022-04-13 DIAGNOSIS — E1122 Type 2 diabetes mellitus with diabetic chronic kidney disease: Secondary | ICD-10-CM | POA: Diagnosis not present

## 2022-04-13 DIAGNOSIS — Z940 Kidney transplant status: Secondary | ICD-10-CM | POA: Diagnosis not present

## 2022-04-13 DIAGNOSIS — Z79899 Other long term (current) drug therapy: Secondary | ICD-10-CM | POA: Diagnosis not present

## 2022-04-13 DIAGNOSIS — I10 Essential (primary) hypertension: Secondary | ICD-10-CM | POA: Diagnosis not present

## 2022-04-13 DIAGNOSIS — Z4822 Encounter for aftercare following kidney transplant: Secondary | ICD-10-CM | POA: Diagnosis not present

## 2022-04-13 DIAGNOSIS — Z79621 Long term (current) use of calcineurin inhibitor: Secondary | ICD-10-CM | POA: Diagnosis not present

## 2022-04-13 DIAGNOSIS — E119 Type 2 diabetes mellitus without complications: Secondary | ICD-10-CM | POA: Diagnosis not present

## 2022-04-13 DIAGNOSIS — T8611 Kidney transplant rejection: Secondary | ICD-10-CM | POA: Diagnosis not present

## 2022-04-28 DIAGNOSIS — D649 Anemia, unspecified: Secondary | ICD-10-CM | POA: Diagnosis not present

## 2022-04-28 DIAGNOSIS — B258 Other cytomegaloviral diseases: Secondary | ICD-10-CM | POA: Diagnosis not present

## 2022-04-28 DIAGNOSIS — D472 Monoclonal gammopathy: Secondary | ICD-10-CM | POA: Diagnosis not present

## 2022-05-04 DIAGNOSIS — Z940 Kidney transplant status: Secondary | ICD-10-CM | POA: Diagnosis not present

## 2022-05-04 DIAGNOSIS — E119 Type 2 diabetes mellitus without complications: Secondary | ICD-10-CM | POA: Diagnosis not present

## 2022-05-04 DIAGNOSIS — E1122 Type 2 diabetes mellitus with diabetic chronic kidney disease: Secondary | ICD-10-CM | POA: Diagnosis not present

## 2022-05-04 DIAGNOSIS — Z4822 Encounter for aftercare following kidney transplant: Secondary | ICD-10-CM | POA: Diagnosis not present

## 2022-05-04 DIAGNOSIS — Z5181 Encounter for therapeutic drug level monitoring: Secondary | ICD-10-CM | POA: Diagnosis not present

## 2022-05-04 DIAGNOSIS — Z79621 Long term (current) use of calcineurin inhibitor: Secondary | ICD-10-CM | POA: Diagnosis not present

## 2022-05-04 DIAGNOSIS — N184 Chronic kidney disease, stage 4 (severe): Secondary | ICD-10-CM | POA: Diagnosis not present

## 2022-05-04 DIAGNOSIS — D849 Immunodeficiency, unspecified: Secondary | ICD-10-CM | POA: Diagnosis not present

## 2022-05-19 DIAGNOSIS — Z0000 Encounter for general adult medical examination without abnormal findings: Secondary | ICD-10-CM | POA: Diagnosis not present

## 2022-05-19 DIAGNOSIS — E7800 Pure hypercholesterolemia, unspecified: Secondary | ICD-10-CM | POA: Diagnosis not present

## 2022-05-19 DIAGNOSIS — R7303 Prediabetes: Secondary | ICD-10-CM | POA: Diagnosis not present

## 2022-05-19 DIAGNOSIS — Z853 Personal history of malignant neoplasm of breast: Secondary | ICD-10-CM | POA: Diagnosis not present

## 2022-05-19 DIAGNOSIS — Z940 Kidney transplant status: Secondary | ICD-10-CM | POA: Diagnosis not present

## 2022-05-19 DIAGNOSIS — R7309 Other abnormal glucose: Secondary | ICD-10-CM | POA: Diagnosis not present

## 2022-05-19 DIAGNOSIS — I10 Essential (primary) hypertension: Secondary | ICD-10-CM | POA: Diagnosis not present

## 2022-05-19 DIAGNOSIS — E213 Hyperparathyroidism, unspecified: Secondary | ICD-10-CM | POA: Diagnosis not present

## 2022-05-19 DIAGNOSIS — M8580 Other specified disorders of bone density and structure, unspecified site: Secondary | ICD-10-CM | POA: Diagnosis not present

## 2022-06-01 DIAGNOSIS — Z940 Kidney transplant status: Secondary | ICD-10-CM | POA: Diagnosis not present

## 2022-06-01 DIAGNOSIS — D849 Immunodeficiency, unspecified: Secondary | ICD-10-CM | POA: Diagnosis not present

## 2022-06-28 DIAGNOSIS — Z853 Personal history of malignant neoplasm of breast: Secondary | ICD-10-CM | POA: Diagnosis not present

## 2022-06-28 DIAGNOSIS — E349 Endocrine disorder, unspecified: Secondary | ICD-10-CM | POA: Diagnosis not present

## 2022-06-28 DIAGNOSIS — M8588 Other specified disorders of bone density and structure, other site: Secondary | ICD-10-CM | POA: Diagnosis not present

## 2022-06-28 DIAGNOSIS — Z7952 Long term (current) use of systemic steroids: Secondary | ICD-10-CM | POA: Diagnosis not present

## 2022-06-29 DIAGNOSIS — Z79899 Other long term (current) drug therapy: Secondary | ICD-10-CM | POA: Diagnosis not present

## 2022-06-29 DIAGNOSIS — D849 Immunodeficiency, unspecified: Secondary | ICD-10-CM | POA: Diagnosis not present

## 2022-06-29 DIAGNOSIS — Z4822 Encounter for aftercare following kidney transplant: Secondary | ICD-10-CM | POA: Diagnosis not present

## 2022-06-29 DIAGNOSIS — Z5181 Encounter for therapeutic drug level monitoring: Secondary | ICD-10-CM | POA: Diagnosis not present

## 2022-06-29 DIAGNOSIS — I10 Essential (primary) hypertension: Secondary | ICD-10-CM | POA: Diagnosis not present

## 2022-06-29 DIAGNOSIS — D472 Monoclonal gammopathy: Secondary | ICD-10-CM | POA: Diagnosis not present

## 2022-06-29 DIAGNOSIS — E119 Type 2 diabetes mellitus without complications: Secondary | ICD-10-CM | POA: Diagnosis not present

## 2022-06-29 DIAGNOSIS — I129 Hypertensive chronic kidney disease with stage 1 through stage 4 chronic kidney disease, or unspecified chronic kidney disease: Secondary | ICD-10-CM | POA: Diagnosis not present

## 2022-06-29 DIAGNOSIS — Z940 Kidney transplant status: Secondary | ICD-10-CM | POA: Diagnosis not present

## 2022-06-29 DIAGNOSIS — Z79621 Long term (current) use of calcineurin inhibitor: Secondary | ICD-10-CM | POA: Diagnosis not present

## 2022-06-29 DIAGNOSIS — M8580 Other specified disorders of bone density and structure, unspecified site: Secondary | ICD-10-CM | POA: Diagnosis not present

## 2022-07-07 DIAGNOSIS — C50912 Malignant neoplasm of unspecified site of left female breast: Secondary | ICD-10-CM | POA: Diagnosis not present

## 2022-07-18 DIAGNOSIS — C50912 Malignant neoplasm of unspecified site of left female breast: Secondary | ICD-10-CM | POA: Diagnosis not present

## 2022-07-25 DIAGNOSIS — C50912 Malignant neoplasm of unspecified site of left female breast: Secondary | ICD-10-CM | POA: Diagnosis not present

## 2022-07-25 DIAGNOSIS — Z9011 Acquired absence of right breast and nipple: Secondary | ICD-10-CM | POA: Diagnosis not present

## 2022-07-27 DIAGNOSIS — Z79899 Other long term (current) drug therapy: Secondary | ICD-10-CM | POA: Diagnosis not present

## 2022-07-27 DIAGNOSIS — Z940 Kidney transplant status: Secondary | ICD-10-CM | POA: Diagnosis not present

## 2022-08-15 ENCOUNTER — Encounter (HOSPITAL_COMMUNITY): Payer: Self-pay | Admitting: Emergency Medicine

## 2022-08-15 ENCOUNTER — Ambulatory Visit (HOSPITAL_COMMUNITY)
Admission: EM | Admit: 2022-08-15 | Discharge: 2022-08-15 | Disposition: A | Discharge status: Home / Self Care | Payer: Medicare Other | Attending: Family Medicine | Admitting: Family Medicine

## 2022-08-15 ENCOUNTER — Other Ambulatory Visit: Payer: Self-pay

## 2022-08-15 DIAGNOSIS — Z940 Kidney transplant status: Secondary | ICD-10-CM | POA: Diagnosis not present

## 2022-08-15 DIAGNOSIS — Z7952 Long term (current) use of systemic steroids: Secondary | ICD-10-CM | POA: Diagnosis not present

## 2022-08-15 DIAGNOSIS — B349 Viral infection, unspecified: Secondary | ICD-10-CM | POA: Diagnosis not present

## 2022-08-15 DIAGNOSIS — B9689 Other specified bacterial agents as the cause of diseases classified elsewhere: Secondary | ICD-10-CM | POA: Diagnosis not present

## 2022-08-15 DIAGNOSIS — T8619 Other complication of kidney transplant: Secondary | ICD-10-CM | POA: Diagnosis not present

## 2022-08-15 DIAGNOSIS — R5383 Other fatigue: Secondary | ICD-10-CM

## 2022-08-15 DIAGNOSIS — Z1152 Encounter for screening for COVID-19: Secondary | ICD-10-CM | POA: Insufficient documentation

## 2022-08-15 DIAGNOSIS — N179 Acute kidney failure, unspecified: Secondary | ICD-10-CM | POA: Diagnosis not present

## 2022-08-15 DIAGNOSIS — Z79621 Long term (current) use of calcineurin inhibitor: Secondary | ICD-10-CM | POA: Diagnosis not present

## 2022-08-15 DIAGNOSIS — D72829 Elevated white blood cell count, unspecified: Secondary | ICD-10-CM | POA: Diagnosis not present

## 2022-08-15 DIAGNOSIS — Z7984 Long term (current) use of oral hypoglycemic drugs: Secondary | ICD-10-CM | POA: Diagnosis not present

## 2022-08-15 DIAGNOSIS — Z87891 Personal history of nicotine dependence: Secondary | ICD-10-CM | POA: Diagnosis not present

## 2022-08-15 DIAGNOSIS — R944 Abnormal results of kidney function studies: Secondary | ICD-10-CM | POA: Diagnosis not present

## 2022-08-15 DIAGNOSIS — Z7982 Long term (current) use of aspirin: Secondary | ICD-10-CM | POA: Diagnosis not present

## 2022-08-15 DIAGNOSIS — N390 Urinary tract infection, site not specified: Secondary | ICD-10-CM | POA: Diagnosis not present

## 2022-08-15 DIAGNOSIS — D84821 Immunodeficiency due to drugs: Secondary | ICD-10-CM | POA: Diagnosis not present

## 2022-08-15 DIAGNOSIS — Z853 Personal history of malignant neoplasm of breast: Secondary | ICD-10-CM | POA: Diagnosis not present

## 2022-08-15 DIAGNOSIS — Z9221 Personal history of antineoplastic chemotherapy: Secondary | ICD-10-CM | POA: Diagnosis not present

## 2022-08-15 DIAGNOSIS — N3000 Acute cystitis without hematuria: Secondary | ICD-10-CM | POA: Diagnosis not present

## 2022-08-15 LAB — CBC
HCT: 36.1 % (ref 36.0–46.0)
Hemoglobin: 11.7 g/dL — ABNORMAL LOW (ref 12.0–15.0)
MCH: 28.9 pg (ref 26.0–34.0)
MCHC: 32.4 g/dL (ref 30.0–36.0)
MCV: 89.1 fL (ref 80.0–100.0)
Platelets: 248 10*3/uL (ref 150–400)
RBC: 4.05 MIL/uL (ref 3.87–5.11)
RDW: 14.7 % (ref 11.5–15.5)
WBC: 17.3 10*3/uL — ABNORMAL HIGH (ref 4.0–10.5)
nRBC: 0 % (ref 0.0–0.2)

## 2022-08-15 LAB — BASIC METABOLIC PANEL
Anion gap: 11 (ref 5–15)
BUN: 68 mg/dL — ABNORMAL HIGH (ref 8–23)
CO2: 18 mmol/L — ABNORMAL LOW (ref 22–32)
Calcium: 9.4 mg/dL (ref 8.9–10.3)
Chloride: 106 mmol/L (ref 98–111)
Creatinine, Ser: 5.38 mg/dL — ABNORMAL HIGH (ref 0.44–1.00)
GFR, Estimated: 8 mL/min — ABNORMAL LOW (ref >60)
Glucose, Bld: 132 mg/dL — ABNORMAL HIGH (ref 70–99)
Potassium: 4.4 mmol/L (ref 3.5–5.1)
Sodium: 135 mmol/L (ref 135–145)

## 2022-08-15 LAB — SARS CORONAVIRUS 2 (TAT 6-24 HRS): SARS Coronavirus 2: NEGATIVE

## 2022-08-15 NOTE — ED Triage Notes (Addendum)
Saturday started having cold chills.  Saturday and Sunday fingers and toes cold and had diarrhea (Sat and Sunday-total 5 stools).  .  Since then no energy and loss of taste.    Patient did take tylenol.    History of kidney transplant.    Patient has been to [place] with family (Friday) [place]

## 2022-08-15 NOTE — ED Provider Notes (Signed)
[facility]    CSN: [id number] Arrival date & time: 08/15/22  0950      History   Chief Complaint Chief Complaint  Patient presents with   Fatigue    HPI Karen Myers is a 77 y.o. female.   HPI Here for chills and fatigue and diarrhea. She first had the chills on July 6.  Then she had some nausea but no vomiting.  She also had diarrhea and loose stools.  She probably had a total of 5 loose stools over the 48-hour period.  Today she is not nauseated and she has had no diarrhea.  No cough or congestion.  She has noticed that her taste is not right and she is having trouble with her sense of smell.  She has been tired.  She is taking Tylenol as needed  She has had a kidney transplant.  Last EGFR last month was 9.  That is found in care everywhere.  She is taking prednisone and Prograf daily.  She does not take mycophenolate anymore. Past Medical History:  Diagnosis Date   Arthritis    Breast cancer (HCC)    left breast cancer    Breast cancer (HCC)    Chronic kidney disease    CIN III (cervical intraepithelial neoplasia III)    prior to LEEP procedure   GERD (gastroesophageal reflux disease)    High cholesterol    Hypertension     Patient Active Problem List   Diagnosis Date Noted   Arthralgia of left lower leg 08/03/2020   Primary hyperparathyroidism (HCC) 12/07/2018   Hyperparathyroidism, primary (HCC) 05/27/2017   CIN 3 - cervical intraepithelial neoplasia grade 3 10/06/2011   Breast CA (HCC) 12/02/2010   Renal insufficiency 12/02/2010   Hypertension 12/02/2010   Hyperlipidemia 12/02/2010    Past Surgical History:  Procedure Laterality Date   BREAST LUMPECTOMY     CERVICAL BIOPSY  W/ LOOP ELECTRODE EXCISION  2005   CHOLECYSTECTOMY N/A 03/18/2021   Procedure: LAPAROSCOPIC CHOLECYSTECTOMY WITH ICG;  Surgeon: Andria Meuse, MD;  Location: [facility];  Service: General;  Laterality: N/A;   COLONOSCOPY     HYSTEROSCOPY WITH D & C  2005    MASTECTOMY  2002   bilateral    PARATHYROIDECTOMY N/A 06/01/2017   Procedure: PARATHYROIDECTOMY;  Surgeon: Darnell Level, MD;  Location: [facility];  Service: General;  Laterality: N/A;   PARATHYROIDECTOMY N/A 12/07/2018   Procedure: NECK EXPLORATION, RIGHT INFERIOR PARATHYROIDECTOMY, LEFT THYROID LOBECTOMY;  Surgeon: Darnell Level, MD;  Location: [facility];  Service: General;  Laterality: N/A;   POLYPECTOMY     RENAL BIOPSY     VULVA Ples Specter BIOPSY     nevus on vulva     OB History     Gravida  2   Para  2   Term      Preterm      AB      Living  2      SAB      IAB      Ectopic      Multiple      Live Births               Home Medications    Prior to Admission medications   Medication Sig Start Date End Date Taking? Authorizing Provider  acetaminophen (TYLENOL) 500 MG tablet Take 1,000 mg by mouth 2 (two) times daily.    [provider]  amLODipine (NORVASC) 5 MG tablet Take 5 mg  by mouth daily as needed. 08/30/21   [provider]  aspirin EC 81 MG tablet Take 81 mg by mouth daily.    [provider]  atorvastatin (LIPITOR) 40 MG tablet Take 40 mg by mouth daily.    [provider]  DILT-XR 240 MG 24 hr capsule Take 240 mg by mouth daily. Patient not taking: Reported on 08/15/2022 12/09/20   [provider]  FA-B6-B12-D-Omega 3-Phytoster (ANIMI-3/VITAMIN D PO) Take 1 tablet by mouth daily.    [provider]  fluticasone (FLONASE) 50 MCG/ACT nasal spray Place 1 spray into both nostrils daily. 02/27/22   Carlisle Beers, FNP  JANUVIA 25 MG tablet Take 25 mg by mouth daily.    [provider]  mycophenolate (MYFORTIC) 180 MG EC tablet Take 180 mg by mouth in the morning and at bedtime. Patient not taking: Reported on 08/15/2022 08/13/21   [provider]  NIFEdipine (PROCARDIA-XL/NIFEDICAL-XL) 30 MG 24 hr tablet Take 30 mg by mouth daily. 08/04/21   [provider]  pantoprazole  (PROTONIX) 40 MG tablet Take 40 mg by mouth daily. Patient not taking: Reported on 08/15/2022 08/31/21   [provider]  polyethylene glycol (MIRALAX / GLYCOLAX) 17 g packet Take 17 g by mouth as needed. 08/04/21   [provider]  predniSONE (DELTASONE) 5 MG tablet Take 10 mg by mouth 2 (two) times daily. 08/31/21   [provider]  senna-docusate (SENOKOT-S) 8.6-50 MG tablet Take 1 tablet by mouth as needed. 08/04/21   [provider]  sulfamethoxazole-trimethoprim (BACTRIM) 400-80 MG tablet Take 1 tablet by mouth daily. 08/04/21   [provider]  tacrolimus (PROGRAF) 1 MG capsule Take 1 mg by mouth in the morning and at bedtime. 08/23/21   [provider]  valGANciclovir (VALCYTE) 450 MG tablet Take 450 mg by mouth daily. Patient not taking: Reported on 08/15/2022 08/04/21   [provider]  losartan (COZAAR) 100 MG tablet Take 100 mg by mouth daily.  03/07/20  [provider]    Family History History reviewed. No pertinent family history.  Social History Social History   Tobacco Use   Smoking status: Former   Smokeless tobacco: Never  Building services engineer Use: Never used  Substance Use Topics   Alcohol use: No   Drug use: No     Allergies   Altace [ramipril], Lisinopril, and Losartan   Review of Systems Review of Systems   Physical Exam Triage Vital Signs ED Triage Vitals  Enc Vitals Group     BP 08/15/22 1027 99/63     Pulse Rate 08/15/22 1027 (!) 107     Resp 08/15/22 1027 20     Temp 08/15/22 1027 98.7 F (37.1 C)     Temp Source 08/15/22 1027 Oral     SpO2 08/15/22 1027 97 %     Weight --      Height --      Head Circumference --      Peak Flow --      Pain Score 08/15/22 1021 2     Pain Loc --      Pain Edu? --      Excl. in GC? --    No data found.  Updated Vital Signs BP 99/63 (BP Location: Left Arm) Comment: patient took all medicines this morning  Pulse (!) 107   Temp 98.7 F (37.1  C) (Oral)   Resp 20   SpO2 97%   Visual  Acuity Right Eye Distance:   Left Eye Distance:   Bilateral Distance:    Right Eye Near:   Left Eye Near:    Bilateral Near:     Physical Exam Vitals reviewed.  Constitutional:      General: She is not in acute distress.    Appearance: She is not ill-appearing, toxic-appearing or diaphoretic.     Comments: She is alert and talkative and well-appearing in the exam room.  HENT:     Nose: Nose normal.     Mouth/Throat:     Mouth: Mucous membranes are moist.  Eyes:     Extraocular Movements: Extraocular movements intact.     Conjunctiva/sclera: Conjunctivae normal.     Pupils: Pupils are equal, round, and reactive to light.  Cardiovascular:     Rate and Rhythm: Normal rate and regular rhythm.  Pulmonary:     Effort: Pulmonary effort is normal. No respiratory distress.     Breath sounds: Normal breath sounds. No stridor. No wheezing, rhonchi or rales.  Abdominal:     Palpations: Abdomen is soft.     Tenderness: There is no abdominal tenderness.  Musculoskeletal:     Cervical back: Neck supple.  Lymphadenopathy:     Cervical: No cervical adenopathy.  Skin:    Coloration: Skin is not jaundiced or pale.  Neurological:     General: No focal deficit present.     Mental Status: She is alert and oriented to person, place, and time.  Psychiatric:        Behavior: Behavior normal.      UC Treatments / Results  Labs (all labs ordered are listed, but only abnormal results are displayed) Labs Reviewed  SARS CORONAVIRUS 2 (TAT 6-24 HRS)  CBC  BASIC METABOLIC PANEL    EKG   Radiology No results found.  Procedures Procedures (including critical care time)  Medications Ordered in UC Medications - No data to display  Initial Impression / Assessment and Plan / UC Course  I have reviewed the triage vital signs and the nursing notes.  Pertinent labs & imaging results that were available during my care of the patient were  reviewed by me and considered in my medical decision making (see chart for details).        COVID swab is done today.  If positive we will notify her.  She is not a candidate for Paxlovid due to her severe CKD.  She in the past is decided against taking molnupiravir, since it does not have clear benefits For her. Her systolic pressure is 99 and her heart rate is 107.  I have asked her to check her blood pressure 2 or 3 times a day and these ensuing 2 to 3 days.  If her pressure is going lower or she feels worse, she is to present to the emergency room.  We discussed oral hydration.  CBC and BMP are drawn today.  If there are significant changes, we will notify her Final Clinical Impressions(s) / UC Diagnoses   Final diagnoses:  None   Discharge Instructions   None    ED Prescriptions   None    PDMP not reviewed this encounter.   Zenia Resides, MD 08/15/22 1049

## 2022-08-15 NOTE — Discharge Instructions (Signed)
  You have been swabbed for COVID, and the test will result in the next 24 hours. Our staff will call you if positive. If the COVID test is positive, you should quarantine until you are fever free for 24 hours and you are starting to feel better, and then take added precautions for the next 5 days, such as physical distancing/wearing a mask and good hand hygiene/washing.  We have also drawn blood to check your blood counts, electrolytes, sugar and kidney function numbers.  Staff will notify you if anything is significantly abnormal and changed from your prior lab results.

## 2022-08-16 ENCOUNTER — Telehealth: Payer: Self-pay | Admitting: Emergency Medicine

## 2022-08-16 NOTE — Telephone Encounter (Signed)
Patient is being send post-discharge from the Urgent Care and sent to the Emergency Department via private vehicle . Per Ashlee, APP, patient is in need of higher level of care due to elevated white count, almost doubled creatinine and symptoms. Patient is aware and verbalizes understanding of plan of care.  She states she will reach out to her doctors at [facility] where she had a kidney transplant done 1 year ago, and then will proceed to the ED

## 2022-08-24 DIAGNOSIS — D849 Immunodeficiency, unspecified: Secondary | ICD-10-CM | POA: Diagnosis not present

## 2022-08-24 DIAGNOSIS — Z79899 Other long term (current) drug therapy: Secondary | ICD-10-CM | POA: Diagnosis not present

## 2022-08-24 DIAGNOSIS — E119 Type 2 diabetes mellitus without complications: Secondary | ICD-10-CM | POA: Diagnosis not present

## 2022-08-24 DIAGNOSIS — R809 Proteinuria, unspecified: Secondary | ICD-10-CM | POA: Diagnosis not present

## 2022-08-24 DIAGNOSIS — Z5181 Encounter for therapeutic drug level monitoring: Secondary | ICD-10-CM | POA: Diagnosis not present

## 2022-08-24 DIAGNOSIS — Z940 Kidney transplant status: Secondary | ICD-10-CM | POA: Diagnosis not present

## 2022-08-24 DIAGNOSIS — I10 Essential (primary) hypertension: Secondary | ICD-10-CM | POA: Diagnosis not present

## 2022-08-24 DIAGNOSIS — Z79621 Long term (current) use of calcineurin inhibitor: Secondary | ICD-10-CM | POA: Diagnosis not present

## 2022-08-24 DIAGNOSIS — Z4822 Encounter for aftercare following kidney transplant: Secondary | ICD-10-CM | POA: Diagnosis not present

## 2022-08-31 DIAGNOSIS — Z5181 Encounter for therapeutic drug level monitoring: Secondary | ICD-10-CM | POA: Diagnosis not present

## 2022-08-31 DIAGNOSIS — I120 Hypertensive chronic kidney disease with stage 5 chronic kidney disease or end stage renal disease: Secondary | ICD-10-CM | POA: Diagnosis not present

## 2022-08-31 DIAGNOSIS — Z940 Kidney transplant status: Secondary | ICD-10-CM | POA: Diagnosis not present

## 2022-08-31 DIAGNOSIS — I10 Essential (primary) hypertension: Secondary | ICD-10-CM | POA: Diagnosis not present

## 2022-08-31 DIAGNOSIS — R809 Proteinuria, unspecified: Secondary | ICD-10-CM | POA: Diagnosis not present

## 2022-08-31 DIAGNOSIS — Z4822 Encounter for aftercare following kidney transplant: Secondary | ICD-10-CM | POA: Diagnosis not present

## 2022-08-31 DIAGNOSIS — E1122 Type 2 diabetes mellitus with diabetic chronic kidney disease: Secondary | ICD-10-CM | POA: Diagnosis not present

## 2022-08-31 DIAGNOSIS — Z79621 Long term (current) use of calcineurin inhibitor: Secondary | ICD-10-CM | POA: Diagnosis not present

## 2022-08-31 DIAGNOSIS — D849 Immunodeficiency, unspecified: Secondary | ICD-10-CM | POA: Diagnosis not present

## 2022-08-31 DIAGNOSIS — N185 Chronic kidney disease, stage 5: Secondary | ICD-10-CM | POA: Diagnosis not present

## 2022-09-08 DIAGNOSIS — D631 Anemia in chronic kidney disease: Secondary | ICD-10-CM | POA: Diagnosis not present

## 2022-09-08 DIAGNOSIS — B259 Cytomegaloviral disease, unspecified: Secondary | ICD-10-CM | POA: Diagnosis not present

## 2022-09-08 DIAGNOSIS — N184 Chronic kidney disease, stage 4 (severe): Secondary | ICD-10-CM | POA: Diagnosis not present

## 2022-09-08 DIAGNOSIS — Z79899 Other long term (current) drug therapy: Secondary | ICD-10-CM | POA: Diagnosis not present

## 2022-09-08 DIAGNOSIS — N042 Nephrotic syndrome with diffuse membranous glomerulonephritis, unspecified: Secondary | ICD-10-CM | POA: Diagnosis not present

## 2022-09-08 DIAGNOSIS — I129 Hypertensive chronic kidney disease with stage 1 through stage 4 chronic kidney disease, or unspecified chronic kidney disease: Secondary | ICD-10-CM | POA: Diagnosis not present

## 2022-09-08 DIAGNOSIS — E213 Hyperparathyroidism, unspecified: Secondary | ICD-10-CM | POA: Diagnosis not present

## 2022-09-08 DIAGNOSIS — Z940 Kidney transplant status: Secondary | ICD-10-CM | POA: Diagnosis not present

## 2022-09-08 DIAGNOSIS — D84821 Immunodeficiency due to drugs: Secondary | ICD-10-CM | POA: Diagnosis not present

## 2022-09-08 DIAGNOSIS — N189 Chronic kidney disease, unspecified: Secondary | ICD-10-CM | POA: Diagnosis not present

## 2022-09-12 DIAGNOSIS — D479 Neoplasm of uncertain behavior of lymphoid, hematopoietic and related tissue, unspecified: Secondary | ICD-10-CM | POA: Diagnosis not present

## 2022-09-12 DIAGNOSIS — D472 Monoclonal gammopathy: Secondary | ICD-10-CM | POA: Diagnosis not present

## 2022-09-14 DIAGNOSIS — N185 Chronic kidney disease, stage 5: Secondary | ICD-10-CM | POA: Diagnosis not present

## 2022-09-14 DIAGNOSIS — T8619 Other complication of kidney transplant: Secondary | ICD-10-CM | POA: Diagnosis not present

## 2022-09-14 DIAGNOSIS — Z7984 Long term (current) use of oral hypoglycemic drugs: Secondary | ICD-10-CM | POA: Diagnosis not present

## 2022-09-14 DIAGNOSIS — I10 Essential (primary) hypertension: Secondary | ICD-10-CM | POA: Diagnosis not present

## 2022-09-14 DIAGNOSIS — Z79621 Long term (current) use of calcineurin inhibitor: Secondary | ICD-10-CM | POA: Diagnosis not present

## 2022-09-14 DIAGNOSIS — Z940 Kidney transplant status: Secondary | ICD-10-CM | POA: Diagnosis not present

## 2022-09-14 DIAGNOSIS — D84821 Immunodeficiency due to drugs: Secondary | ICD-10-CM | POA: Diagnosis not present

## 2022-09-14 DIAGNOSIS — Z79899 Other long term (current) drug therapy: Secondary | ICD-10-CM | POA: Diagnosis not present

## 2022-09-14 DIAGNOSIS — E139 Other specified diabetes mellitus without complications: Secondary | ICD-10-CM | POA: Diagnosis not present

## 2022-09-14 DIAGNOSIS — D649 Anemia, unspecified: Secondary | ICD-10-CM | POA: Diagnosis not present

## 2022-09-28 DIAGNOSIS — E119 Type 2 diabetes mellitus without complications: Secondary | ICD-10-CM | POA: Diagnosis not present

## 2022-09-28 DIAGNOSIS — Z4822 Encounter for aftercare following kidney transplant: Secondary | ICD-10-CM | POA: Diagnosis not present

## 2022-09-28 DIAGNOSIS — Z940 Kidney transplant status: Secondary | ICD-10-CM | POA: Diagnosis not present

## 2022-09-28 DIAGNOSIS — Z79621 Long term (current) use of calcineurin inhibitor: Secondary | ICD-10-CM | POA: Diagnosis not present

## 2022-09-28 DIAGNOSIS — Z5181 Encounter for therapeutic drug level monitoring: Secondary | ICD-10-CM | POA: Diagnosis not present

## 2022-10-12 DIAGNOSIS — Z4822 Encounter for aftercare following kidney transplant: Secondary | ICD-10-CM | POA: Diagnosis not present

## 2022-10-12 DIAGNOSIS — E139 Other specified diabetes mellitus without complications: Secondary | ICD-10-CM | POA: Diagnosis not present

## 2022-10-12 DIAGNOSIS — D472 Monoclonal gammopathy: Secondary | ICD-10-CM | POA: Diagnosis not present

## 2022-10-12 DIAGNOSIS — Z5181 Encounter for therapeutic drug level monitoring: Secondary | ICD-10-CM | POA: Diagnosis not present

## 2022-10-12 DIAGNOSIS — E119 Type 2 diabetes mellitus without complications: Secondary | ICD-10-CM | POA: Diagnosis not present

## 2022-10-12 DIAGNOSIS — D849 Immunodeficiency, unspecified: Secondary | ICD-10-CM | POA: Diagnosis not present

## 2022-10-12 DIAGNOSIS — D84821 Immunodeficiency due to drugs: Secondary | ICD-10-CM | POA: Diagnosis not present

## 2022-10-12 DIAGNOSIS — Z79621 Long term (current) use of calcineurin inhibitor: Secondary | ICD-10-CM | POA: Diagnosis not present

## 2022-10-12 DIAGNOSIS — Z79899 Other long term (current) drug therapy: Secondary | ICD-10-CM | POA: Diagnosis not present

## 2022-10-12 DIAGNOSIS — Z940 Kidney transplant status: Secondary | ICD-10-CM | POA: Diagnosis not present

## 2022-10-13 DIAGNOSIS — Z940 Kidney transplant status: Secondary | ICD-10-CM | POA: Diagnosis not present

## 2022-10-21 DIAGNOSIS — H43813 Vitreous degeneration, bilateral: Secondary | ICD-10-CM | POA: Diagnosis not present

## 2022-10-21 DIAGNOSIS — H40013 Open angle with borderline findings, low risk, bilateral: Secondary | ICD-10-CM | POA: Diagnosis not present

## 2022-10-21 DIAGNOSIS — H35033 Hypertensive retinopathy, bilateral: Secondary | ICD-10-CM | POA: Diagnosis not present

## 2022-10-21 DIAGNOSIS — H25813 Combined forms of age-related cataract, bilateral: Secondary | ICD-10-CM | POA: Diagnosis not present

## 2022-11-07 DIAGNOSIS — H25811 Combined forms of age-related cataract, right eye: Secondary | ICD-10-CM | POA: Diagnosis not present

## 2022-11-10 DIAGNOSIS — Z5181 Encounter for therapeutic drug level monitoring: Secondary | ICD-10-CM | POA: Diagnosis not present

## 2022-11-10 DIAGNOSIS — Z79621 Long term (current) use of calcineurin inhibitor: Secondary | ICD-10-CM | POA: Diagnosis not present

## 2022-11-10 DIAGNOSIS — D849 Immunodeficiency, unspecified: Secondary | ICD-10-CM | POA: Diagnosis not present

## 2022-11-10 DIAGNOSIS — I151 Hypertension secondary to other renal disorders: Secondary | ICD-10-CM | POA: Diagnosis not present

## 2022-11-10 DIAGNOSIS — E119 Type 2 diabetes mellitus without complications: Secondary | ICD-10-CM | POA: Diagnosis not present

## 2022-11-10 DIAGNOSIS — U071 COVID-19: Secondary | ICD-10-CM | POA: Diagnosis not present

## 2022-11-10 DIAGNOSIS — C50919 Malignant neoplasm of unspecified site of unspecified female breast: Secondary | ICD-10-CM | POA: Diagnosis not present

## 2022-11-10 DIAGNOSIS — Z940 Kidney transplant status: Secondary | ICD-10-CM | POA: Diagnosis not present

## 2022-11-10 DIAGNOSIS — N185 Chronic kidney disease, stage 5: Secondary | ICD-10-CM | POA: Diagnosis not present

## 2022-11-10 DIAGNOSIS — E871 Hypo-osmolality and hyponatremia: Secondary | ICD-10-CM | POA: Diagnosis not present

## 2022-11-14 DIAGNOSIS — D84821 Immunodeficiency due to drugs: Secondary | ICD-10-CM | POA: Diagnosis not present

## 2022-11-14 DIAGNOSIS — N189 Chronic kidney disease, unspecified: Secondary | ICD-10-CM | POA: Diagnosis not present

## 2022-11-14 DIAGNOSIS — Z79899 Other long term (current) drug therapy: Secondary | ICD-10-CM | POA: Diagnosis not present

## 2022-11-14 DIAGNOSIS — N042 Nephrotic syndrome with diffuse membranous glomerulonephritis, unspecified: Secondary | ICD-10-CM | POA: Diagnosis not present

## 2022-11-14 DIAGNOSIS — E213 Hyperparathyroidism, unspecified: Secondary | ICD-10-CM | POA: Diagnosis not present

## 2022-11-14 DIAGNOSIS — D631 Anemia in chronic kidney disease: Secondary | ICD-10-CM | POA: Diagnosis not present

## 2022-11-14 DIAGNOSIS — Z940 Kidney transplant status: Secondary | ICD-10-CM | POA: Diagnosis not present

## 2022-11-14 DIAGNOSIS — N184 Chronic kidney disease, stage 4 (severe): Secondary | ICD-10-CM | POA: Diagnosis not present

## 2022-11-14 DIAGNOSIS — N185 Chronic kidney disease, stage 5: Secondary | ICD-10-CM | POA: Diagnosis not present

## 2022-11-14 DIAGNOSIS — I120 Hypertensive chronic kidney disease with stage 5 chronic kidney disease or end stage renal disease: Secondary | ICD-10-CM | POA: Diagnosis not present

## 2022-11-14 DIAGNOSIS — N390 Urinary tract infection, site not specified: Secondary | ICD-10-CM | POA: Diagnosis not present

## 2022-11-14 DIAGNOSIS — B259 Cytomegaloviral disease, unspecified: Secondary | ICD-10-CM | POA: Diagnosis not present

## 2022-11-15 DIAGNOSIS — Z940 Kidney transplant status: Secondary | ICD-10-CM | POA: Diagnosis not present

## 2022-11-29 DIAGNOSIS — H25811 Combined forms of age-related cataract, right eye: Secondary | ICD-10-CM | POA: Diagnosis not present

## 2022-11-29 DIAGNOSIS — H268 Other specified cataract: Secondary | ICD-10-CM | POA: Diagnosis not present

## 2022-12-05 DIAGNOSIS — H25812 Combined forms of age-related cataract, left eye: Secondary | ICD-10-CM | POA: Diagnosis not present

## 2022-12-16 DIAGNOSIS — Z9013 Acquired absence of bilateral breasts and nipples: Secondary | ICD-10-CM | POA: Diagnosis not present

## 2023-01-03 DIAGNOSIS — H25812 Combined forms of age-related cataract, left eye: Secondary | ICD-10-CM | POA: Diagnosis not present

## 2023-01-03 DIAGNOSIS — H268 Other specified cataract: Secondary | ICD-10-CM | POA: Diagnosis not present

## 2023-01-17 DIAGNOSIS — Z79899 Other long term (current) drug therapy: Secondary | ICD-10-CM | POA: Diagnosis not present

## 2023-01-17 DIAGNOSIS — B259 Cytomegaloviral disease, unspecified: Secondary | ICD-10-CM | POA: Diagnosis not present

## 2023-01-17 DIAGNOSIS — D84821 Immunodeficiency due to drugs: Secondary | ICD-10-CM | POA: Diagnosis not present

## 2023-01-17 DIAGNOSIS — E213 Hyperparathyroidism, unspecified: Secondary | ICD-10-CM | POA: Diagnosis not present

## 2023-01-17 DIAGNOSIS — N185 Chronic kidney disease, stage 5: Secondary | ICD-10-CM | POA: Diagnosis not present

## 2023-01-17 DIAGNOSIS — I120 Hypertensive chronic kidney disease with stage 5 chronic kidney disease or end stage renal disease: Secondary | ICD-10-CM | POA: Diagnosis not present

## 2023-01-17 DIAGNOSIS — N042 Nephrotic syndrome with diffuse membranous glomerulonephritis, unspecified: Secondary | ICD-10-CM | POA: Diagnosis not present

## 2023-01-17 DIAGNOSIS — Z940 Kidney transplant status: Secondary | ICD-10-CM | POA: Diagnosis not present

## 2023-01-17 DIAGNOSIS — D631 Anemia in chronic kidney disease: Secondary | ICD-10-CM | POA: Diagnosis not present

## 2023-01-18 DIAGNOSIS — N185 Chronic kidney disease, stage 5: Secondary | ICD-10-CM | POA: Diagnosis not present

## 2023-01-18 DIAGNOSIS — E213 Hyperparathyroidism, unspecified: Secondary | ICD-10-CM | POA: Diagnosis not present

## 2023-01-18 DIAGNOSIS — N390 Urinary tract infection, site not specified: Secondary | ICD-10-CM | POA: Diagnosis not present

## 2023-01-18 DIAGNOSIS — D84821 Immunodeficiency due to drugs: Secondary | ICD-10-CM | POA: Diagnosis not present

## 2023-01-18 DIAGNOSIS — Z940 Kidney transplant status: Secondary | ICD-10-CM | POA: Diagnosis not present

## 2023-01-18 DIAGNOSIS — N189 Chronic kidney disease, unspecified: Secondary | ICD-10-CM | POA: Diagnosis not present

## 2023-01-20 DIAGNOSIS — C50912 Malignant neoplasm of unspecified site of left female breast: Secondary | ICD-10-CM | POA: Diagnosis not present

## 2023-01-25 DIAGNOSIS — I10 Essential (primary) hypertension: Secondary | ICD-10-CM | POA: Diagnosis not present

## 2023-01-25 DIAGNOSIS — N185 Chronic kidney disease, stage 5: Secondary | ICD-10-CM | POA: Diagnosis not present

## 2023-01-25 DIAGNOSIS — N189 Chronic kidney disease, unspecified: Secondary | ICD-10-CM | POA: Diagnosis not present

## 2023-01-25 DIAGNOSIS — E7800 Pure hypercholesterolemia, unspecified: Secondary | ICD-10-CM | POA: Diagnosis not present

## 2023-01-25 DIAGNOSIS — E213 Hyperparathyroidism, unspecified: Secondary | ICD-10-CM | POA: Diagnosis not present

## 2023-01-25 DIAGNOSIS — R252 Cramp and spasm: Secondary | ICD-10-CM | POA: Diagnosis not present

## 2023-03-13 DIAGNOSIS — D472 Monoclonal gammopathy: Secondary | ICD-10-CM | POA: Diagnosis not present

## 2023-03-31 ENCOUNTER — Telehealth: Payer: Self-pay | Admitting: *Deleted

## 2023-04-26 ENCOUNTER — Ambulatory Visit (HOSPITAL_COMMUNITY)
Admission: EM | Admit: 2023-04-26 | Discharge: 2023-04-26 | Disposition: A | Discharge status: Home / Self Care | Attending: Nurse Practitioner | Admitting: Nurse Practitioner

## 2023-04-26 ENCOUNTER — Encounter (HOSPITAL_COMMUNITY): Payer: Self-pay | Admitting: Emergency Medicine

## 2023-04-26 DIAGNOSIS — R0982 Postnasal drip: Secondary | ICD-10-CM | POA: Diagnosis not present

## 2023-04-26 LAB — POCT RAPID STREP A (OFFICE): Rapid Strep A Screen: NEGATIVE

## 2023-04-26 MED ORDER — FLUTICASONE PROPIONATE 50 MCG/ACT NA SUSP: 1.0000 | Freq: Every day | NASAL | 0 refills | Status: AC

## 2023-04-26 NOTE — Discharge Instructions (Addendum)
 Start using the Flonase nasal spray daily to help with post nasal drainage and prevent the itchy throat.  Follow up with PCP if symptoms do not improve with treatment.

## 2023-04-26 NOTE — ED Triage Notes (Signed)
 Pt reports itching in her throat for couple weeks that is getting worse. Reports will have to try to clear her throat. Denies coughing anything up or post nasal drip. Denies pain.

## 2023-04-26 NOTE — ED Provider Notes (Signed)
 [facility]    CSN: [id number] Arrival date & time: 04/26/23  1528      History   Chief Complaint Chief Complaint  Patient presents with   itchy throat    HPI Karen Myers is a 78 y.o. female.   Patient presents today with 2-week history of frequent throat clearing and itching in her throat.  She also endorses intermittent sneezing, however denies itchy or watery eyes.  She denies recent fever, cough, congestion, or sore throat.  No ear pain or drainage.  No known sick contacts.  Has not tried anything for symptoms so far as she is nervous because she recently had a kidney transplant and does not want anything to interact with her medicine.    Past Medical History:  Diagnosis Date   Arthritis    Breast cancer (HCC)    left breast cancer    Breast cancer (HCC)    Chronic kidney disease    CIN III (cervical intraepithelial neoplasia III)    prior to LEEP procedure   GERD (gastroesophageal reflux disease)    High cholesterol    Hypertension     Patient Active Problem List   Diagnosis Date Noted   Arthralgia of left lower leg 08/03/2020   Primary hyperparathyroidism (HCC) 12/07/2018   Hyperparathyroidism, primary (HCC) 05/27/2017   CIN III (cervical intraepithelial neoplasia III) 10/06/2011   Breast CA (HCC) 12/02/2010   Renal insufficiency 12/02/2010   Hypertension 12/02/2010   Hyperlipidemia 12/02/2010    Past Surgical History:  Procedure Laterality Date   BREAST LUMPECTOMY     CERVICAL BIOPSY  W/ LOOP ELECTRODE EXCISION  2005   CHOLECYSTECTOMY N/A 03/18/2021   Procedure: LAPAROSCOPIC CHOLECYSTECTOMY WITH ICG;  Surgeon: Andria Meuse, MD;  Location: [facility];  Service: General;  Laterality: N/A;   COLONOSCOPY     HYSTEROSCOPY WITH D & C  2005   MASTECTOMY  2002   bilateral    PARATHYROIDECTOMY N/A 06/01/2017   Procedure: PARATHYROIDECTOMY;  Surgeon: Darnell Level, MD;  Location: [facility];  Service: General;  Laterality: N/A;    PARATHYROIDECTOMY N/A 12/07/2018   Procedure: NECK EXPLORATION, RIGHT INFERIOR PARATHYROIDECTOMY, LEFT THYROID LOBECTOMY;  Surgeon: Darnell Level, MD;  Location: [facility];  Service: General;  Laterality: N/A;   POLYPECTOMY     RENAL BIOPSY     VULVA Ples Specter BIOPSY     nevus on vulva     OB History     Gravida  2   Para  2   Term      Preterm      AB      Living  2      SAB      IAB      Ectopic      Multiple      Live Births               Home Medications    Prior to Admission medications   Medication Sig Start Date End Date Taking? Authorizing Provider  fluticasone (FLONASE) 50 MCG/ACT nasal spray Place 1 spray into both nostrils daily. 04/26/23  Yes Valentino Nose, NP  acetaminophen (TYLENOL) 500 MG tablet Take 1,000 mg by mouth 2 (two) times daily.    [provider]  amLODipine (NORVASC) 5 MG tablet Take 5 mg by mouth daily as needed. 08/30/21   [provider]  aspirin EC 81 MG tablet Take 81 mg by mouth daily.    [provider]  atorvastatin (  LIPITOR) 40 MG tablet Take 40 mg by mouth daily.    [provider]  DILT-XR 240 MG 24 hr capsule Take 240 mg by mouth daily. Patient not taking: Reported on 08/15/2022 12/09/20   [provider]  FA-B6-B12-D-Omega 3-Phytoster (ANIMI-3/VITAMIN D PO) Take 1 tablet by mouth daily.    [provider]  JANUVIA 25 MG tablet Take 25 mg by mouth daily.    [provider]  mycophenolate (MYFORTIC) 180 MG EC tablet Take 180 mg by mouth in the morning and at bedtime. Patient not taking: Reported on 08/15/2022 08/13/21   [provider]  NIFEdipine (PROCARDIA-XL/NIFEDICAL-XL) 30 MG 24 hr tablet Take 30 mg by mouth daily. 08/04/21   [provider]  pantoprazole (PROTONIX) 40 MG tablet Take 40 mg by mouth daily. Patient not taking: Reported on 08/15/2022 08/31/21   [provider]  polyethylene glycol (MIRALAX / GLYCOLAX) 17 g packet Take 17 g by  mouth as needed. 08/04/21   [provider]  predniSONE (DELTASONE) 5 MG tablet Take 10 mg by mouth 2 (two) times daily. 08/31/21   [provider]  senna-docusate (SENOKOT-S) 8.6-50 MG tablet Take 1 tablet by mouth as needed. 08/04/21   [provider]  sulfamethoxazole-trimethoprim (BACTRIM) 400-80 MG tablet Take 1 tablet by mouth daily. 08/04/21   [provider]  tacrolimus (PROGRAF) 1 MG capsule Take 1 mg by mouth in the morning and at bedtime. 08/23/21   [provider]  valGANciclovir (VALCYTE) 450 MG tablet Take 450 mg by mouth daily. Patient not taking: Reported on 08/15/2022 08/04/21   [provider]  losartan (COZAAR) 100 MG tablet Take 100 mg by mouth daily.  03/07/20  [provider]    Family History No family history on file.  Social History Social History   Tobacco Use   Smoking status: Former   Smokeless tobacco: Never  Vaping Use   Vaping status: Never Used  Substance Use Topics   Alcohol use: No   Drug use: No     Allergies   Altace [ramipril], Lisinopril, and Losartan   Review of Systems Review of Systems Per HPI  Physical Exam Triage Vital Signs ED Triage Vitals  Encounter Vitals Group     BP 04/26/23 1601 132/80     Systolic BP Percentile --      Diastolic BP Percentile --      Pulse Rate 04/26/23 1601 91     Resp 04/26/23 1601 17     Temp 04/26/23 1601 97.8 F (36.6 C)     Temp Source 04/26/23 1601 Oral     SpO2 04/26/23 1601 97 %     Weight --      Height --      Head Circumference --      Peak Flow --      Pain Score 04/26/23 1600 0     Pain Loc --      Pain Education --      Exclude from Growth Chart --    No data found.  Updated Vital Signs BP 132/80 (BP Location: Left Arm)   Pulse 91   Temp 97.8 F (36.6 C) (Oral)   Resp 17   SpO2 97%   Visual Acuity Right Eye Distance:   Left Eye Distance:   Bilateral Distance:    Right Eye Near:   Left Eye Near:    Bilateral  Near:     Physical Exam Vitals and nursing note reviewed.  Constitutional:      General: She is not in acute distress.    Appearance: She is well-developed. She is not toxic-appearing.  HENT:     Head: Normocephalic and atraumatic.     Right Ear: Tympanic membrane and ear canal normal. No drainage or swelling. No middle ear effusion. Tympanic membrane is not erythematous.     Left Ear: Tympanic membrane and ear canal normal. No drainage, swelling or tenderness.  No middle ear effusion. Tympanic membrane is not erythematous.     Nose: No congestion.     Mouth/Throat:     Mouth: Mucous membranes are moist.     Pharynx: Oropharynx is clear. Uvula midline. No oropharyngeal exudate or posterior oropharyngeal erythema.     Tonsils: Tonsillar exudate present. 2+ on the right. 2+ on the left.     Comments: Cobblestoning of posterior pharynx Eyes:     Extraocular Movements:     Right eye: Normal extraocular motion.     Left eye: Normal extraocular motion.  Cardiovascular:     Rate and Rhythm: Normal rate and regular rhythm.  Pulmonary:     Effort: Pulmonary effort is normal. No respiratory distress.     Breath sounds: Normal breath sounds. No wheezing, rhonchi or rales.  Musculoskeletal:     Cervical back: Normal range of motion and neck supple.  Lymphadenopathy:     Cervical: No cervical adenopathy.  Skin:    General: Skin is warm and dry.     Capillary Refill: Capillary refill takes less than 2 seconds.     Coloration: Skin is not pale.     Findings: No erythema or rash.  Neurological:     Mental Status: She is alert and oriented to person, place, and time.  Psychiatric:        Behavior: Behavior is cooperative.      UC Treatments / Results  Labs (all labs ordered are listed, but only abnormal results are displayed) Labs Reviewed  POCT RAPID STREP A (OFFICE)    EKG   Radiology No results found.  Procedures Procedures (including critical care time)  Medications  Ordered in UC Medications - No data to display  Initial Impression / Assessment and Plan / UC Course  I have reviewed the triage vital signs and the nursing notes.  Pertinent labs & imaging results that were available during my care of the patient were reviewed by me and considered in my medical decision making (see chart for details).   Patient is well-appearing, normotensive, afebrile, not tachycardic, not tachypneic, oxygenating well on room air.    1. Post-nasal drainage Vitals and examination are reassuring today Rapid strep throat test is negative Suspect allergic rhinitis, treat with Flonase nasal spray Recommended follow up with PCP if symptoms do not improve with treatment   The patient was given the opportunity to ask questions.  All questions answered to their satisfaction.  The patient is in agreement to this plan.   Final Clinical Impressions(s) / UC Diagnoses   Final diagnoses:  Post-nasal drainage     Discharge Instructions      Start using the Flonase nasal spray daily to help with post nasal drainage and prevent the itchy throat.  Follow up with PCP if symptoms do not improve with treatment.    ED Prescriptions     Medication Sig Dispense Auth. Provider   fluticasone (FLONASE) 50 MCG/ACT nasal spray Place 1 spray into both nostrils daily. 16 g Valentino Nose, NP  PDMP not reviewed this encounter.   Valentino Nose, NP 04/26/23 [phone]

## 2023-04-28 DIAGNOSIS — I129 Hypertensive chronic kidney disease with stage 1 through stage 4 chronic kidney disease, or unspecified chronic kidney disease: Secondary | ICD-10-CM | POA: Diagnosis not present

## 2023-04-28 DIAGNOSIS — B259 Cytomegaloviral disease, unspecified: Secondary | ICD-10-CM | POA: Diagnosis not present

## 2023-04-28 DIAGNOSIS — D631 Anemia in chronic kidney disease: Secondary | ICD-10-CM | POA: Diagnosis not present

## 2023-04-28 DIAGNOSIS — N189 Chronic kidney disease, unspecified: Secondary | ICD-10-CM | POA: Diagnosis not present

## 2023-04-28 DIAGNOSIS — N042 Nephrotic syndrome with diffuse membranous glomerulonephritis, unspecified: Secondary | ICD-10-CM | POA: Diagnosis not present

## 2023-04-28 DIAGNOSIS — E213 Hyperparathyroidism, unspecified: Secondary | ICD-10-CM | POA: Diagnosis not present

## 2023-04-28 DIAGNOSIS — Z79899 Other long term (current) drug therapy: Secondary | ICD-10-CM | POA: Diagnosis not present

## 2023-04-28 DIAGNOSIS — I120 Hypertensive chronic kidney disease with stage 5 chronic kidney disease or end stage renal disease: Secondary | ICD-10-CM | POA: Diagnosis not present

## 2023-04-28 DIAGNOSIS — D84821 Immunodeficiency due to drugs: Secondary | ICD-10-CM | POA: Diagnosis not present

## 2023-04-28 DIAGNOSIS — Z940 Kidney transplant status: Secondary | ICD-10-CM | POA: Diagnosis not present

## 2023-04-28 DIAGNOSIS — N390 Urinary tract infection, site not specified: Secondary | ICD-10-CM | POA: Diagnosis not present

## 2023-04-28 DIAGNOSIS — N185 Chronic kidney disease, stage 5: Secondary | ICD-10-CM | POA: Diagnosis not present

## 2023-05-25 DIAGNOSIS — M8580 Other specified disorders of bone density and structure, unspecified site: Secondary | ICD-10-CM | POA: Diagnosis not present

## 2023-05-25 DIAGNOSIS — I10 Essential (primary) hypertension: Secondary | ICD-10-CM | POA: Diagnosis not present

## 2023-05-25 DIAGNOSIS — N185 Chronic kidney disease, stage 5: Secondary | ICD-10-CM | POA: Diagnosis not present

## 2023-05-25 DIAGNOSIS — E7800 Pure hypercholesterolemia, unspecified: Secondary | ICD-10-CM | POA: Diagnosis not present

## 2023-05-25 DIAGNOSIS — Z853 Personal history of malignant neoplasm of breast: Secondary | ICD-10-CM | POA: Diagnosis not present

## 2023-05-25 DIAGNOSIS — E213 Hyperparathyroidism, unspecified: Secondary | ICD-10-CM | POA: Diagnosis not present

## 2023-05-25 DIAGNOSIS — N189 Chronic kidney disease, unspecified: Secondary | ICD-10-CM | POA: Diagnosis not present

## 2023-05-25 DIAGNOSIS — Z940 Kidney transplant status: Secondary | ICD-10-CM | POA: Diagnosis not present

## 2023-05-25 DIAGNOSIS — R7303 Prediabetes: Secondary | ICD-10-CM | POA: Diagnosis not present

## 2023-05-25 DIAGNOSIS — Z0000 Encounter for general adult medical examination without abnormal findings: Secondary | ICD-10-CM | POA: Diagnosis not present

## 2023-05-25 DIAGNOSIS — Z7189 Other specified counseling: Secondary | ICD-10-CM | POA: Diagnosis not present

## 2023-05-26 ENCOUNTER — Ambulatory Visit (HOSPITAL_COMMUNITY)
Admission: EM | Admit: 2023-05-26 | Discharge: 2023-05-26 | Disposition: A | Discharge status: Home / Self Care | Attending: Nurse Practitioner | Admitting: Nurse Practitioner

## 2023-05-26 ENCOUNTER — Encounter (HOSPITAL_COMMUNITY): Payer: Self-pay | Admitting: *Deleted

## 2023-05-26 DIAGNOSIS — R052 Subacute cough: Secondary | ICD-10-CM | POA: Diagnosis not present

## 2023-05-26 MED ORDER — BENZONATATE 100 MG PO CAPS: 100.0000 mg | ORAL_CAPSULE | Freq: Three times a day (TID) | ORAL | 0 refills | Status: AC | PRN

## 2023-05-26 NOTE — ED Provider Notes (Signed)
 [facility]    CSN: [id number] Arrival date & time: 05/26/23  9040      History   Chief Complaint Chief Complaint  Patient presents with   Cough    HPI Karen Myers is a 78 y.o. female.   HPI  She is in today for evaluation of persistent cough.  She endorses that she was seen approximately 1 month ago for upper respiratory infection.  She was treated however the cough has continued to linger.  She denies any fever chills, headaches, dizziness, shortness of breath, chest pain, nausea, vomiting.  She has not had any known exposures however she has been having to take her husband known to the hospital for frequent falls he is now a patient at the nursing home with expectation of being released in a few days.  She just wants to make sure that she is doing well overall. Past Medical History:  Diagnosis Date   Arthritis    Breast cancer (HCC)    left breast cancer    Breast cancer (HCC)    Chronic kidney disease    CIN III (cervical intraepithelial neoplasia III)    prior to LEEP procedure   GERD (gastroesophageal reflux disease)    High cholesterol    Hypertension     Patient Active Problem List   Diagnosis Date Noted   Arthralgia of left lower leg 08/03/2020   Primary hyperparathyroidism (HCC) 12/07/2018   Hyperparathyroidism, primary (HCC) 05/27/2017   CIN III (cervical intraepithelial neoplasia III) 10/06/2011   Breast CA (HCC) 12/02/2010   Renal insufficiency 12/02/2010   Hypertension 12/02/2010   Hyperlipidemia 12/02/2010    Past Surgical History:  Procedure Laterality Date   BREAST LUMPECTOMY     CERVICAL BIOPSY  W/ LOOP ELECTRODE EXCISION  2005   CHOLECYSTECTOMY N/A 03/18/2021   Procedure: LAPAROSCOPIC CHOLECYSTECTOMY WITH ICG;  Surgeon: Teresa Lonni HERO, MD;  Location: [facility];  Service: General;  Laterality: N/A;   COLONOSCOPY     HYSTEROSCOPY WITH D & C  2005   MASTECTOMY  2002   bilateral    PARATHYROIDECTOMY N/A 06/01/2017    Procedure: PARATHYROIDECTOMY;  Surgeon: Eletha Boas, MD;  Location: [facility];  Service: General;  Laterality: N/A;   PARATHYROIDECTOMY N/A 12/07/2018   Procedure: NECK EXPLORATION, RIGHT INFERIOR PARATHYROIDECTOMY, LEFT THYROID  LOBECTOMY;  Surgeon: Eletha Boas, MD;  Location: [facility];  Service: General;  Laterality: N/A;   POLYPECTOMY     RENAL BIOPSY     VULVA MARYBETH BIOPSY     nevus on vulva     OB History     Gravida  2   Para  2   Term      Preterm      AB      Living  2      SAB      IAB      Ectopic      Multiple      Live Births               Home Medications    Prior to Admission medications   Medication Sig Start Date End Date Taking? Authorizing Provider  acetaminophen  (TYLENOL ) 500 MG tablet Take 1,000 mg by mouth 2 (two) times daily.   Yes [provider]  amLODipine (NORVASC) 5 MG tablet Take 5 mg by mouth daily as needed. 08/30/21  Yes [provider]  aspirin  EC 81 MG tablet Take 81 mg by mouth daily.   Yes [provider]  atorvastatin (LIPITOR) 40 MG tablet Take 40 mg by mouth daily.   Yes [provider]  benzonatate  (TESSALON ) 100 MG capsule Take 1 capsule (100 mg total) by mouth 3 (three) times daily as needed for up to 10 days for cough. 05/26/23 06/05/23 Yes Thorin Starner, Camelia HERO, NP  DILT-XR 240 MG 24 hr capsule Take 240 mg by mouth daily. 12/09/20  Yes [provider]  FA-B6-B12-D-Omega 3-Phytoster (ANIMI-3/VITAMIN D  PO) Take 1 tablet by mouth daily.   Yes [provider]  fluticasone  (FLONASE ) 50 MCG/ACT nasal spray Place 1 spray into both nostrils daily. 04/26/23  Yes Chandra Raisin A, NP  JANUVIA 25 MG tablet Take 25 mg by mouth daily.   Yes [provider]  mycophenolate (MYFORTIC) 180 MG EC tablet Take 180 mg by mouth in the morning and at bedtime. 08/13/21  Yes [provider]  NIFEdipine (PROCARDIA-XL/NIFEDICAL-XL) 30 MG 24 hr tablet Take 30 mg by mouth daily. 08/04/21   Yes [provider]  pantoprazole  (PROTONIX ) 40 MG tablet Take 40 mg by mouth daily. 08/31/21  Yes [provider]  polyethylene glycol (MIRALAX / GLYCOLAX) 17 g packet Take 17 g by mouth as needed. 08/04/21  Yes [provider]  predniSONE  (DELTASONE ) 5 MG tablet Take 10 mg by mouth 2 (two) times daily. 08/31/21  Yes [provider]  senna-docusate (SENOKOT-S) 8.6-50 MG tablet Take 1 tablet by mouth as needed. 08/04/21  Yes [provider]  sulfamethoxazole -trimethoprim  (BACTRIM ) 400-80 MG tablet Take 1 tablet by mouth daily. 08/04/21  Yes [provider]  tacrolimus  (PROGRAF ) 1 MG capsule Take 1 mg by mouth in the morning and at bedtime. 08/23/21  Yes [provider]  valGANciclovir (VALCYTE) 450 MG tablet Take 450 mg by mouth daily. 08/04/21  Yes [provider]  losartan  (COZAAR ) 100 MG tablet Take 100 mg by mouth daily.  03/07/20  [provider]    Family History History reviewed. No pertinent family history.  Social History Social History   Tobacco Use   Smoking status: Former   Smokeless tobacco: Never  Vaping Use   Vaping status: Never Used  Substance Use Topics   Alcohol use: No   Drug use: No     Allergies   Altace [ramipril], Lisinopril, and Losartan    Review of Systems Review of Systems   Physical Exam Triage Vital Signs ED Triage Vitals  Encounter Vitals Group     BP 05/26/23 1044 (!) 170/99     Systolic BP Percentile --      Diastolic BP Percentile --      Pulse Rate 05/26/23 1044 91     Resp 05/26/23 1044 16     Temp 05/26/23 1044 98 F (36.7 C)     Temp Source 05/26/23 1044 Oral     SpO2 05/26/23 1044 96 %     Weight --      Height --      Head Circumference --      Peak Flow --      Pain Score 05/26/23 1043 0     Pain Loc --      Pain Education --      Exclude from Growth Chart --    No data found.  Updated Vital Signs BP (!) 170/99 (BP Location: Left Arm)   Pulse  91   Temp 98 F (36.7 C) (Oral)   Resp 16   SpO2 96%   Visual Acuity Right Eye Distance:  Left Eye Distance:   Bilateral Distance:    Right Eye Near:   Left Eye Near:    Bilateral Near:     Physical Exam Constitutional:      General: She is not in acute distress.    Appearance: She is normal weight. She is not ill-appearing, toxic-appearing or diaphoretic.  HENT:     Head: Normocephalic.     Nose: Nose normal.     Mouth/Throat:     Mouth: Mucous membranes are moist.  Cardiovascular:     Rate and Rhythm: Normal rate and regular rhythm.     Pulses: Normal pulses.     Heart sounds: Normal heart sounds.  Pulmonary:     Effort: Pulmonary effort is normal.     Breath sounds: Normal breath sounds.  Musculoskeletal:     Cervical back: Normal range of motion.  Skin:    General: Skin is warm and dry.     Capillary Refill: Capillary refill takes less than 2 seconds.  Neurological:     General: No focal deficit present.     Mental Status: She is alert and oriented to person, place, and time.  Psychiatric:        Mood and Affect: Mood normal.        Behavior: Behavior normal.   Follow-up collaborative practice 1 day   UC Treatments / Results  Labs (all labs ordered are listed, but only abnormal results are displayed) Labs Reviewed - No data to display  EKG   Radiology No results found.  Procedures Procedures (including critical care time)  Medications Ordered in UC Medications - No data to display  Initial Impression / Assessment and Plan / UC Course  I have reviewed the triage vital signs and the nursing notes.  Pertinent labs & imaging results that were available during my care of the patient were reviewed by me and considered in my medical decision making (see chart for details).     cough Final Clinical Impressions(s) / UC Diagnoses   Final diagnoses:  Subacute cough     Discharge Instructions      You have been diagnosed with a subacute cough.   You have been prescribed benzonatate 's 100 mg 3 times daily as needed for cough.  As discussed you are encouraged to avoid Mucinex  because it does have potential to raise blood pressure.  In the future if you are needing to treat your cough you are encouraged to use Coricidin HBP cough medicine. If your symptoms persist or get worse you are encouraged to follow-up here or go to the nearest emergency room.  You can always follow-up with your primary care provider as well for further evaluation.    ED Prescriptions     Medication Sig Dispense Auth. Provider   benzonatate  (TESSALON ) 100 MG capsule Take 1 capsule (100 mg total) by mouth 3 (three) times daily as needed for up to 10 days for cough. 30 capsule Myrna Camelia HERO, NP      PDMP not reviewed this encounter.   Myrna Camelia Molalla, NP 05/26/23 1159

## 2023-05-26 NOTE — ED Triage Notes (Signed)
 Pt states her cough has restarted X 1 week. She states the Flonase  was helping but not now. She did take a dose of mucinex .

## 2023-05-26 NOTE — Discharge Instructions (Signed)
 You have been diagnosed with a subacute cough.  You have been prescribed benzonatate 's 100 mg 3 times daily as needed for cough.  As discussed you are encouraged to avoid Mucinex  because it does have potential to raise blood pressure.  In the future if you are needing to treat your cough you are encouraged to use Coricidin HBP cough medicine. If your symptoms persist or get worse you are encouraged to follow-up here or go to the nearest emergency room.  You can always follow-up with your primary care provider as well for further evaluation.

## 2023-06-24 ENCOUNTER — Encounter (HOSPITAL_COMMUNITY): Payer: Self-pay

## 2023-06-24 ENCOUNTER — Ambulatory Visit (INDEPENDENT_AMBULATORY_CARE_PROVIDER_SITE_OTHER)

## 2023-06-24 ENCOUNTER — Ambulatory Visit (HOSPITAL_COMMUNITY)
Admission: EM | Admit: 2023-06-24 | Discharge: 2023-06-24 | Disposition: A | Discharge status: Home / Self Care | Attending: Family Medicine | Admitting: Family Medicine

## 2023-06-24 DIAGNOSIS — R052 Subacute cough: Secondary | ICD-10-CM

## 2023-06-24 DIAGNOSIS — R059 Cough, unspecified: Secondary | ICD-10-CM | POA: Diagnosis not present

## 2023-06-24 MED ORDER — AZITHROMYCIN 250 MG PO TABS: 250.0000 mg | ORAL_TABLET | Freq: Every day | ORAL | 0 refills | Status: DC

## 2023-06-24 MED ORDER — BENZONATATE 100 MG PO CAPS: ORAL_CAPSULE | ORAL | 0 refills | Status: DC

## 2023-06-24 NOTE — ED Triage Notes (Signed)
 Patient reports that she has had a cough x 1 month then states that she was prescribed Flonase  on 05/26/23. Patient states the nasal congestion continues. Cough is worse at night.

## 2023-06-26 NOTE — ED Provider Notes (Signed)
 [facility]   [id number] 06/24/23 Arrival Time: 1257  ASSESSMENT & PLAN:  1. Subacute cough    I have personally viewed and independently interpreted the imaging studies ordered this visit. CXR: no acute changes.  Given duration of symptoms: Meds ordered this encounter  Medications   benzonatate  (TESSALON ) 100 MG capsule    Sig: Take 1 capsule by mouth every 8 (eight) hours for cough.    Dispense:  21 capsule    Refill:  0   azithromycin  (ZITHROMAX ) 250 MG tablet    Sig: Take 1 tablet (250 mg total) by mouth daily. Take first 2 tablets together, then 1 every day until finished.    Dispense:  6 tablet    Refill:  0     Follow-up Information     Merl Star, MD.   Specialty: Internal Medicine Why: As needed. Contact information: [address] [place] [address] [phone]                 Reviewed expectations re: course of current medical issues. Questions answered. Outlined signs and symptoms indicating need for more acute intervention. Understanding verbalized. After Visit Summary given.   SUBJECTIVE: History from: Patient. Karen Myers is a 78 y.o. female. Patient reports that she has had a cough x 1 month then states that she was prescribed Flonase  on 05/26/23. Patient states the nasal congestion continues. Cough is worse at night. Denies fever. Denies: difficulty breathing. Normal PO intake without n/v/d.  OBJECTIVE:  Vitals:   06/24/23 1344 06/24/23 1346  BP: (!) 143/84   Resp: 16   Temp: 97.9 F (36.6 C)   TempSrc: Oral   SpO2: 95% 95%    General appearance: alert; no distress Eyes: PERRLA; EOMI; conjunctiva normal HENT: [place]; AT; without nasal congestion Neck: supple  Lungs: speaks full sentences without difficulty; unlabored; CTAB; dry cough Extremities: no edema Skin: warm and dry Neurologic: normal gait Psychological: alert and cooperative; normal mood and affect   Imaging: DG Chest 2  View Result Date: 06/24/2023 CLINICAL DATA:  Cough for 1 month EXAM: CHEST - 2 VIEW COMPARISON:  Chest x-ray 03/18/2021 FINDINGS: The heart size and mediastinal contours are within normal limits. Both lungs are clear. The visualized skeletal structures are unremarkable. IMPRESSION: No active cardiopulmonary disease. Electronically Signed   By: Tyron Gallon M.D.   On: 06/24/2023 15:22    Allergies  Allergen Reactions   Altace [Ramipril] Swelling    Swelling of lips, hypotension   Lisinopril     Other reaction(s): angioedema   Losartan  Other (See Comments)    Past Medical History:  Diagnosis Date   Arthritis    Breast cancer (HCC)    left breast cancer    Breast cancer (HCC)    Chronic kidney disease    CIN III (cervical intraepithelial neoplasia III)    prior to LEEP procedure   GERD (gastroesophageal reflux disease)    High cholesterol    Hypertension    Social History   Socioeconomic History   Marital status: Married    Spouse name: Not on file   Number of children: Not on file   Years of education: Not on file   Highest education level: Not on file  Occupational History   Not on file  Tobacco Use   Smoking status: Former   Smokeless tobacco: Never  Vaping Use   Vaping status: Never Used  Substance and Sexual Activity   Alcohol use: No  Drug use: No   Sexual activity: Yes  Other Topics Concern   Not on file  Social History Narrative   Not on file   Social Drivers of Health   Financial Resource Strain: Not on file  Food Insecurity: Not on file  Transportation Needs: Not on file  Physical Activity: Not on file  Stress: Not on file  Social Connections: Not on file  Intimate Partner Violence: Not on file   History reviewed. No pertinent family history. Past Surgical History:  Procedure Laterality Date   BREAST LUMPECTOMY     CERVICAL BIOPSY  W/ LOOP ELECTRODE EXCISION  2005   CHOLECYSTECTOMY N/A 03/18/2021   Procedure: LAPAROSCOPIC CHOLECYSTECTOMY WITH  ICG;  Surgeon: Melvenia Stabs, MD;  Location: [facility];  Service: General;  Laterality: N/A;   COLONOSCOPY     HYSTEROSCOPY WITH D & C  2005   MASTECTOMY  2002   bilateral    PARATHYROIDECTOMY N/A 06/01/2017   Procedure: PARATHYROIDECTOMY;  Surgeon: Oralee Billow, MD;  Location: [facility];  Service: General;  Laterality: N/A;   PARATHYROIDECTOMY N/A 12/07/2018   Procedure: NECK EXPLORATION, RIGHT INFERIOR PARATHYROIDECTOMY, LEFT THYROID  LOBECTOMY;  Surgeon: Oralee Billow, MD;  Location: [facility];  Service: General;  Laterality: N/A;   POLYPECTOMY     RENAL BIOPSY     VULVA Asher Blade BIOPSY     nevus on vulva      Afton Albright, MD 06/26/23 [phone]

## 2023-06-27 ENCOUNTER — Ambulatory Visit (HOSPITAL_COMMUNITY): Payer: Self-pay

## 2023-07-03 ENCOUNTER — Ambulatory Visit (INDEPENDENT_AMBULATORY_CARE_PROVIDER_SITE_OTHER)

## 2023-07-03 ENCOUNTER — Ambulatory Visit (HOSPITAL_COMMUNITY)
Admission: EM | Admit: 2023-07-03 | Discharge: 2023-07-03 | Disposition: A | Discharge status: Home / Self Care | Attending: Family Medicine | Admitting: Family Medicine

## 2023-07-03 ENCOUNTER — Encounter (HOSPITAL_COMMUNITY): Payer: Self-pay

## 2023-07-03 DIAGNOSIS — M542 Cervicalgia: Secondary | ICD-10-CM

## 2023-07-03 DIAGNOSIS — M47812 Spondylosis without myelopathy or radiculopathy, cervical region: Secondary | ICD-10-CM | POA: Diagnosis not present

## 2023-07-03 DIAGNOSIS — M5030 Other cervical disc degeneration, unspecified cervical region: Secondary | ICD-10-CM | POA: Diagnosis not present

## 2023-07-03 DIAGNOSIS — M4802 Spinal stenosis, cervical region: Secondary | ICD-10-CM | POA: Diagnosis not present

## 2023-07-03 MED ORDER — METHYLPREDNISOLONE 4 MG PO TBPK: ORAL_TABLET | ORAL | 0 refills | Status: DC

## 2023-07-03 MED ORDER — CYCLOBENZAPRINE HCL 10 MG PO TABS: 10.0000 mg | ORAL_TABLET | Freq: Two times a day (BID) | ORAL | 0 refills | Status: DC | PRN

## 2023-07-03 NOTE — ED Provider Notes (Signed)
 [facility]    CSN: [id number] Arrival date & time: 07/03/23  1018      History   Chief Complaint Chief Complaint  Patient presents with   Neck Pain    HPI Karen Myers is a 78 y.o. female.   Patient presenting for right-sided neck pain.  Patient states that the pain is located on her sternohyoid area and is extremely tender to touch.  Patient notes significant pain over this area and that yesterday she was having a hard time sleeping.  Patient is a kidney presents with patient is currently taking tacrolimus .  Patient is approximately 2 years out of her kidney transplant.  Patient has been taking Tylenol  with minimal relief, patient is unable to take any NSAIDs.  Patient notes pain with side bending of the neck as well as turning to the left or right.  Patient pain is all located on the right side.   Neck Pain   Past Medical History:  Diagnosis Date   Arthritis    Breast cancer (HCC)    left breast cancer    Breast cancer (HCC)    Chronic kidney disease    CIN III (cervical intraepithelial neoplasia III)    prior to LEEP procedure   GERD (gastroesophageal reflux disease)    High cholesterol    Hypertension     Patient Active Problem List   Diagnosis Date Noted   Arthralgia of left lower leg 08/03/2020   Primary hyperparathyroidism (HCC) 12/07/2018   Hyperparathyroidism, primary (HCC) 05/27/2017   CIN III (cervical intraepithelial neoplasia III) 10/06/2011   Breast CA (HCC) 12/02/2010   Renal insufficiency 12/02/2010   Hypertension 12/02/2010   Hyperlipidemia 12/02/2010    Past Surgical History:  Procedure Laterality Date   BREAST LUMPECTOMY     CERVICAL BIOPSY  W/ LOOP ELECTRODE EXCISION  2005   CHOLECYSTECTOMY N/A 03/18/2021   Procedure: LAPAROSCOPIC CHOLECYSTECTOMY WITH ICG;  Surgeon: Melvenia Stabs, MD;  Location: [facility];  Service: General;  Laterality: N/A;   COLONOSCOPY     HYSTEROSCOPY WITH D & C  2005   MASTECTOMY  2002    bilateral    PARATHYROIDECTOMY N/A 06/01/2017   Procedure: PARATHYROIDECTOMY;  Surgeon: Oralee Billow, MD;  Location: [facility];  Service: General;  Laterality: N/A;   PARATHYROIDECTOMY N/A 12/07/2018   Procedure: NECK EXPLORATION, RIGHT INFERIOR PARATHYROIDECTOMY, LEFT THYROID  LOBECTOMY;  Surgeon: Oralee Billow, MD;  Location: [facility];  Service: General;  Laterality: N/A;   POLYPECTOMY     RENAL BIOPSY     VULVA Asher Blade BIOPSY     nevus on vulva     OB History     Gravida  2   Para  2   Term      Preterm      AB      Living  2      SAB      IAB      Ectopic      Multiple      Live Births               Home Medications    Prior to Admission medications   Medication Sig Start Date End Date Taking? Authorizing Provider  cyclobenzaprine  (FLEXERIL ) 10 MG tablet Take 1 tablet (10 mg total) by mouth 2 (two) times daily as needed for muscle spasms. 07/03/23  Yes Jude Norton, MD  methylPREDNISolone  (MEDROL  DOSEPAK) 4 MG TBPK tablet Follow instructions on package 07/03/23  Yes Jude Norton, MD  acetaminophen  (TYLENOL ) 500 MG tablet Take 1,000 mg by mouth 2 (two) times daily.    [provider]  amLODipine (NORVASC) 5 MG tablet Take 5 mg by mouth daily as needed. 08/30/21   [provider]  aspirin  EC 81 MG tablet Take 81 mg by mouth daily.    [provider]  atorvastatin (LIPITOR) 40 MG tablet Take 40 mg by mouth daily.    [provider]  azithromycin  (ZITHROMAX ) 250 MG tablet Take 1 tablet (250 mg total) by mouth daily. Take first 2 tablets together, then 1 every day until finished. 06/24/23   Afton Albright, MD  benzonatate  (TESSALON ) 100 MG capsule Take 1 capsule by mouth every 8 (eight) hours for cough. 06/24/23   Afton Albright, MD  DILT-XR 240 MG 24 hr capsule Take 240 mg by mouth daily. 12/09/20   [provider]  fluticasone  (FLONASE ) 50 MCG/ACT nasal spray Place 1 spray into both nostrils daily. 04/26/23   Wilhemena Harbour, NP   JANUVIA 25 MG tablet Take 25 mg by mouth daily.    [provider]  pantoprazole  (PROTONIX ) 40 MG tablet Take 40 mg by mouth daily. 08/31/21   [provider]  polyethylene glycol (MIRALAX / GLYCOLAX) 17 g packet Take 17 g by mouth as needed. 08/04/21   [provider]  predniSONE  (DELTASONE ) 5 MG tablet Take 10 mg by mouth 2 (two) times daily. 08/31/21   [provider]  senna-docusate (SENOKOT-S) 8.6-50 MG tablet Take 1 tablet by mouth as needed. 08/04/21   [provider]  tacrolimus  (PROGRAF ) 1 MG capsule Take 1 mg by mouth in the morning and at bedtime. 08/23/21   [provider]  losartan  (COZAAR ) 100 MG tablet Take 100 mg by mouth daily.  03/07/20  [provider]    Family History History reviewed. No pertinent family history.  Social History Social History   Tobacco Use   Smoking status: Former   Smokeless tobacco: Never  Advertising account planner   Vaping status: Never Used  Substance Use Topics   Alcohol use: No   Drug use: No     Allergies   Altace [ramipril], Lisinopril, and Losartan    Review of Systems Review of Systems  Musculoskeletal:  Positive for neck pain.     Physical Exam Triage Vital Signs ED Triage Vitals  Encounter Vitals Group     BP 07/03/23 1052 (!) 163/92     Systolic BP Percentile --      Diastolic BP Percentile --      Pulse Rate 07/03/23 1052 87     Resp 07/03/23 1052 14     Temp 07/03/23 1052 98 F (36.7 C)     Temp Source 07/03/23 1052 Oral     SpO2 07/03/23 1052 94 %     Weight --      Height --      Head Circumference --      Peak Flow --      Pain Score 07/03/23 1051 8     Pain Loc --      Pain Education --      Exclude from Growth Chart --    No data found.  Updated Vital Signs BP (!) 163/92 (BP Location: Left Arm)   Pulse 87   Temp 98 F (36.7 C) (Oral)   Resp 14   SpO2 94%   Visual Acuity Right Eye Distance:   Left Eye Distance:   Bilateral Distance:    Right  Eye Near:   Left Eye Near:    Bilateral Near:     Physical Exam Inspection reveals no gross abnormalities of the neck, there is some tenderness to palpation over the sternocleidomastoid.  No noted hypertonicity of the area.  Range of motion is decreased with lateral bending and movement to the left or right of the neck due to pain.  UC Treatments / Results  Labs (all labs ordered are listed, but only abnormal results are displayed) Labs Reviewed - No data to display  EKG   Radiology DG Cervical Spine Complete Result Date: 07/03/2023 CLINICAL DATA:  pain EXAM: CERVICAL SPINE - COMPLETE 5 VIEW COMPARISON:  None Available. FINDINGS: No fracture, dislocation or subluxation. No spondylolisthesis. No osteolytic or osteoblastic changes. Prevertebral and cervical cranial soft tissues are unremarkable. Degenerative disc disease noted with disc space narrowing and marginal osteophytes at C4-5 and C5-6. IMPRESSION: Degenerative changes. No acute osseous abnormalities. Electronically Signed   By: Sydell Eva M.D.   On: 07/03/2023 11:37    Procedures Procedures (including critical care time)  Medications Ordered in UC Medications - No data to display  Initial Impression / Assessment and Plan / UC Course  I have reviewed the triage vital signs and the nursing notes.  Pertinent labs & imaging results that were available during my care of the patient were reviewed by me and considered in my medical decision making (see chart for details).  Clinical Course as of 07/03/23 1150  Mon Jul 03, 2023  1132 DG Cervical Spine Complete [PJ]    Clinical Course User Index [PJ] Jude Norton, MD    Patient presenting with likely muscular pain at this time.  X-rays do show some significant degenerative changes though and some of this could be related to hypertonicity however at this time I suspect patient's pain is more likely related to some muscular pain.  Patient cannot take NSAIDs given recent  transplant.  Will go ahead and do short course of a Medrol  Dosepak at this time.  Patient was advised to follow instructions on package.  Patient also advised to continue using some heat and can alternate ice over that area as well.  Will also go ahead and do Flexeril  as needed.  Patient to follow-up if no improvement.  Final diagnoses:  Neck pain     Discharge Instructions      Please follow instructions on your steroid package, and take as directed.  I am also giving you a muscle relaxer, you may try taking this at night to see if that will help with sleeping as well as some of the pain.  Please note, these may make you drowsy so if you feel like you do not like taking them then you do not have to.   ED Prescriptions     Medication Sig Dispense Auth. Provider   methylPREDNISolone  (MEDROL  DOSEPAK) 4 MG TBPK tablet Follow instructions on package 1 each Malana Eberwein, MD   cyclobenzaprine  (FLEXERIL ) 10 MG tablet Take 1 tablet (10 mg total) by mouth 2 (two) times daily as needed for muscle spasms. 20 tablet Amirra Herling, MD      PDMP not reviewed this encounter.   Jude Norton, MD 07/03/23 1150

## 2023-07-03 NOTE — ED Triage Notes (Signed)
 Patient reports that she began having right lateral neck pain that radiates into the shoulder.  Patient states that she has been using BioFreeze, heating pad, and alcohol for the pain.

## 2023-07-03 NOTE — Discharge Instructions (Addendum)
 Please follow instructions on your steroid package, and take as directed.  I am also giving you a muscle relaxer, you may try taking this at night to see if that will help with sleeping as well as some of the pain.  Please note, these may make you drowsy so if you feel like you do not like taking them then you do not have to.

## 2023-07-17 DIAGNOSIS — N189 Chronic kidney disease, unspecified: Secondary | ICD-10-CM | POA: Diagnosis not present

## 2023-07-17 DIAGNOSIS — I120 Hypertensive chronic kidney disease with stage 5 chronic kidney disease or end stage renal disease: Secondary | ICD-10-CM | POA: Diagnosis not present

## 2023-07-17 DIAGNOSIS — Z940 Kidney transplant status: Secondary | ICD-10-CM | POA: Diagnosis not present

## 2023-07-17 DIAGNOSIS — D631 Anemia in chronic kidney disease: Secondary | ICD-10-CM | POA: Diagnosis not present

## 2023-07-17 DIAGNOSIS — N042 Nephrotic syndrome with diffuse membranous glomerulonephritis, unspecified: Secondary | ICD-10-CM | POA: Diagnosis not present

## 2023-07-17 DIAGNOSIS — N390 Urinary tract infection, site not specified: Secondary | ICD-10-CM | POA: Diagnosis not present

## 2023-07-17 DIAGNOSIS — D84821 Immunodeficiency due to drugs: Secondary | ICD-10-CM | POA: Diagnosis not present

## 2023-07-17 DIAGNOSIS — B259 Cytomegaloviral disease, unspecified: Secondary | ICD-10-CM | POA: Diagnosis not present

## 2023-07-17 DIAGNOSIS — Z79899 Other long term (current) drug therapy: Secondary | ICD-10-CM | POA: Diagnosis not present

## 2023-07-17 DIAGNOSIS — N185 Chronic kidney disease, stage 5: Secondary | ICD-10-CM | POA: Diagnosis not present

## 2023-07-17 DIAGNOSIS — E213 Hyperparathyroidism, unspecified: Secondary | ICD-10-CM | POA: Diagnosis not present

## 2023-07-20 DIAGNOSIS — E871 Hypo-osmolality and hyponatremia: Secondary | ICD-10-CM | POA: Diagnosis not present

## 2023-07-20 DIAGNOSIS — D849 Immunodeficiency, unspecified: Secondary | ICD-10-CM | POA: Diagnosis not present

## 2023-07-20 DIAGNOSIS — Z4822 Encounter for aftercare following kidney transplant: Secondary | ICD-10-CM | POA: Diagnosis not present

## 2023-07-20 DIAGNOSIS — R052 Subacute cough: Secondary | ICD-10-CM | POA: Diagnosis not present

## 2023-07-20 DIAGNOSIS — Z940 Kidney transplant status: Secondary | ICD-10-CM | POA: Diagnosis not present

## 2023-07-20 DIAGNOSIS — Z5181 Encounter for therapeutic drug level monitoring: Secondary | ICD-10-CM | POA: Diagnosis not present

## 2023-07-20 DIAGNOSIS — Z79621 Long term (current) use of calcineurin inhibitor: Secondary | ICD-10-CM | POA: Diagnosis not present

## 2023-07-21 DIAGNOSIS — C50912 Malignant neoplasm of unspecified site of left female breast: Secondary | ICD-10-CM | POA: Diagnosis not present

## 2023-07-25 DIAGNOSIS — N186 End stage renal disease: Secondary | ICD-10-CM | POA: Diagnosis not present

## 2023-07-25 DIAGNOSIS — Z01818 Encounter for other preprocedural examination: Secondary | ICD-10-CM | POA: Diagnosis not present

## 2023-07-25 DIAGNOSIS — Z940 Kidney transplant status: Secondary | ICD-10-CM | POA: Diagnosis not present

## 2023-07-27 DIAGNOSIS — J811 Chronic pulmonary edema: Secondary | ICD-10-CM | POA: Diagnosis not present

## 2023-08-22 ENCOUNTER — Other Ambulatory Visit: Payer: Self-pay

## 2023-08-22 DIAGNOSIS — N186 End stage renal disease: Secondary | ICD-10-CM

## 2023-08-23 NOTE — Telephone Encounter (Signed)
 Pt called and reporting having itching on her and parts of her upper body without rash. She denies changing laundry detergents or soap. No new meds or food Suggested trying allergy medication like Claritin and let me know if it improves. If not we can refer her to dermatology  Olam DELENA Pinal, RN

## 2023-08-30 NOTE — Progress Notes (Deleted)
 Office Note     CC:  Chronic Kidney Disease Requesting Provider:  Rayburn Pac, MD  HPI: Karen Myers is a Right handed 78 y.o. (Mar 31, 1945) female with kidney disease who presents at the request of Rayburn Pac, MD for permanent HD access. The patient has had no prior access procedures. No lines, no current access. Currently CKD5 based on GFR.  Patient had breast cancer over 17 years ago, with chemotherapy causing kidney injury. I saw Karen Myers in 2023, and prior to undergoing fistula creation, Karen Myers received a kidney transplant.  On exam, Karen Myers was doing well.  Karen Myers had no complaints.  We discussed the role of hemodialysis as well as the steps of fistula creation and maturation.  The pt is  on a statin for cholesterol management.  The pt is  on a daily aspirin .   Other AC:  - The pt is  on medications for hypertension.   The pt is not diabetic. Tobacco hx:  -  Past Medical History:  Diagnosis Date   Arthritis    Breast cancer (HCC)    left breast cancer    Breast cancer (HCC)    Chronic kidney disease    CIN III (cervical intraepithelial neoplasia III)    prior to LEEP procedure   GERD (gastroesophageal reflux disease)    High cholesterol    Hypertension     Past Surgical History:  Procedure Laterality Date   BREAST LUMPECTOMY     CERVICAL BIOPSY  W/ LOOP ELECTRODE EXCISION  2005   CHOLECYSTECTOMY N/A 03/18/2021   Procedure: LAPAROSCOPIC CHOLECYSTECTOMY WITH ICG;  Surgeon: Teresa Lonni HERO, MD;  Location: [facility];  Service: General;  Laterality: N/A;   COLONOSCOPY     HYSTEROSCOPY WITH D & C  2005   MASTECTOMY  2002   bilateral    PARATHYROIDECTOMY N/A 06/01/2017   Procedure: PARATHYROIDECTOMY;  Surgeon: Eletha Boas, MD;  Location: [facility];  Service: General;  Laterality: N/A;   PARATHYROIDECTOMY N/A 12/07/2018   Procedure: NECK EXPLORATION, RIGHT INFERIOR PARATHYROIDECTOMY, LEFT THYROID  LOBECTOMY;  Surgeon: Eletha Boas, MD;  Location: [facility];   Service: General;  Laterality: N/A;   POLYPECTOMY     RENAL BIOPSY     VULVA MARYBETH BIOPSY     nevus on vulva     Social History   Socioeconomic History   Marital status: Married    Spouse name: Not on file   Number of children: Not on file   Years of education: Not on file   Highest education level: Not on file  Occupational History   Not on file  Tobacco Use   Smoking status: Former   Smokeless tobacco: Never  Vaping Use   Vaping status: Never Used  Substance and Sexual Activity   Alcohol use: No   Drug use: No   Sexual activity: Yes  Other Topics Concern   Not on file  Social History Narrative   Not on file   Social Drivers of Health   Financial Resource Strain: Not on file  Food Insecurity: Not on file  Transportation Needs: Not on file  Physical Activity: Not on file  Stress: Not on file  Social Connections: Not on file  Intimate Partner Violence: Not on file   No family history on file.  Current Outpatient Medications  Medication Sig Dispense Refill   acetaminophen  (TYLENOL ) 500 MG tablet Take 1,000 mg by mouth 2 (two) times daily.     amLODipine (NORVASC) 5 MG tablet Take 5 mg  by mouth daily as needed.     aspirin  EC 81 MG tablet Take 81 mg by mouth daily.     atorvastatin (LIPITOR) 40 MG tablet Take 40 mg by mouth daily.     azithromycin  (ZITHROMAX ) 250 MG tablet Take 1 tablet (250 mg total) by mouth daily. Take first 2 tablets together, then 1 every day until finished. 6 tablet 0   benzonatate  (TESSALON ) 100 MG capsule Take 1 capsule by mouth every 8 (eight) hours for cough. 21 capsule 0   cyclobenzaprine  (FLEXERIL ) 10 MG tablet Take 1 tablet (10 mg total) by mouth 2 (two) times daily as needed for muscle spasms. 20 tablet 0   DILT-XR 240 MG 24 hr capsule Take 240 mg by mouth daily.     fluticasone  (FLONASE ) 50 MCG/ACT nasal spray Place 1 spray into both nostrils daily. 16 g 0   JANUVIA 25 MG tablet Take 25 mg by mouth daily.     methylPREDNISolone   (MEDROL  DOSEPAK) 4 MG TBPK tablet Follow instructions on package 1 each 0   pantoprazole  (PROTONIX ) 40 MG tablet Take 40 mg by mouth daily.     polyethylene glycol (MIRALAX / GLYCOLAX) 17 g packet Take 17 g by mouth as needed.     predniSONE  (DELTASONE ) 5 MG tablet Take 10 mg by mouth 2 (two) times daily.     senna-docusate (SENOKOT-S) 8.6-50 MG tablet Take 1 tablet by mouth as needed.     tacrolimus  (PROGRAF ) 1 MG capsule Take 1 mg by mouth in the morning and at bedtime.     No current facility-administered medications for this visit.    Allergies  Allergen Reactions   Altace [Ramipril] Swelling    Swelling of lips, hypotension   Lisinopril     Other reaction(s): angioedema   Losartan  Other (See Comments)     REVIEW OF SYSTEMS:   [X]  denotes positive finding, [ ]  denotes negative finding Cardiac  Comments:  Chest pain or chest pressure:    Shortness of breath upon exertion:    Short of breath when lying flat:    Irregular heart rhythm:        Vascular    Pain in calf, thigh, or hip brought on by ambulation:    Pain in feet at night that wakes you up from your sleep:     Blood clot in your veins:    Leg swelling:         Pulmonary    Oxygen at home:    Productive cough:     Wheezing:         Neurologic    Sudden weakness in arms or legs:     Sudden numbness in arms or legs:     Sudden onset of difficulty speaking or slurred speech:    Temporary loss of vision in one eye:     Problems with dizziness:         Gastrointestinal    Blood in stool:     Vomited blood:         Genitourinary    Burning when urinating:     Blood in urine:        Psychiatric    Major depression:         Hematologic    Bleeding problems:    Problems with blood clotting too easily:        Skin    Rashes or ulcers:        Constitutional    Fever or  chills:      PHYSICAL EXAMINATION:  There were no vitals filed for this visit.  General:  WDWN in NAD; vital signs documented  above Gait: Not observed HENT: WNL, normocephalic Pulmonary: normal non-labored breathing , without Rales, rhonchi,  wheezing Cardiac: regular HR,  Abdomen: soft, NT, no masses Skin: without rashes Vascular Exam/Pulses:  Right Left  Radial 2+ (normal) 2+ (normal)  Ulnar 2+ (normal) 2+ (normal)                   Extremities: without ischemic changes, without Gangrene , without cellulitis; without open wounds;  Musculoskeletal: no muscle wasting or atrophy  Neurologic: A&O X 3;  No focal weakness or paresthesias are detected Psychiatric:  The pt has Normal affect.   Non-Invasive Vascular Imaging:   Right Cephalic   Diameter (cm)Depth (cm)Findings  +-----------------+-------------+----------+--------+  Shoulder             0.09                         +-----------------+-------------+----------+--------+  Prox upper arm       0.08                         +-----------------+-------------+----------+--------+  Mid upper arm        0.11                         +-----------------+-------------+----------+--------+  Dist upper arm       0.11                         +-----------------+-------------+----------+--------+  Antecubital fossa    0.36                         +-----------------+-------------+----------+--------+  Prox forearm         0.45                         +-----------------+-------------+----------+--------+  Mid forearm          0.38                         +-----------------+-------------+----------+--------+  Dist forearm         0.35                         +-----------------+-------------+----------+--------+   +-----------------+-------------+----------+--------+  Right Basilic    Diameter (cm)Depth (cm)Findings  +-----------------+-------------+----------+--------+  Prox upper arm       0.62                         +-----------------+-------------+----------+--------+  Mid upper arm        0.69                          +-----------------+-------------+----------+--------+  Dist upper arm       0.63                         +-----------------+-------------+----------+--------+  Antecubital fossa    0.51                         +-----------------+-------------+----------+--------+  Prox forearm  0.39                         +-----------------+-------------+----------+--------+   +-----------------+-------------+----------+--------+  Left Cephalic    Diameter (cm)Depth (cm)Findings  +-----------------+-------------+----------+--------+  Shoulder             0.15                         +-----------------+-------------+----------+--------+  Prox upper arm       0.22                         +-----------------+-------------+----------+--------+  Mid upper arm        0.25                         +-----------------+-------------+----------+--------+  Dist upper arm       0.21                         +-----------------+-------------+----------+--------+  Antecubital fossa    0.21                         +-----------------+-------------+----------+--------+  Prox forearm         0.33                         +-----------------+-------------+----------+--------+  Mid forearm          0.32                         +-----------------+-------------+----------+--------+  Dist forearm         0.46                         +-----------------+-------------+----------+--------+   +-----------------+-------------+----------+--------+  Left Basilic     Diameter (cm)Depth (cm)Findings  +-----------------+-------------+----------+--------+  Prox upper arm       0.64                         +-----------------+-------------+----------+--------+  Mid upper arm        0.53                         +-----------------+-------------+----------+--------+  Dist upper arm       0.57                          +-----------------+-------------+----------+--------+  Antecubital fossa    0.60                         +-----------------+-------------+----------+--------+  Prox forearm         0.54                         +-----------------+-------------+----------+--------+   ASSESSMENT/PLAN:  Karen Myers is a 78 y.o. female who presents with chronic kidney disease stage 5  Based on vein mapping and examination, patient has suitable vein for brachiobasilic fistula bilaterally Karen Myers would be best served with staged, left-sided brachiobasilic fistula. I had an extensive discussion with this patient in regards to the nature of access surgery, including risk, benefits, and alternatives.   The patient is aware that  the risks of access surgery include but are not limited to: bleeding, infection, steal syndrome, nerve damage, ischemic monomelic neuropathy, failure of access to mature, complications related to venous hypertension, and possible need for additional access procedures in the future. I discussed with the patient the nature of the staged access procedure, specifically the need for a second operation to transpose the first stage fistula.  Karen Myers was not ready to proceed with surgery.  Karen Myers asked that it be placed on hold until after Easter, as Karen Myers is going to see her birth father in [place] , who Karen Myers was recently reunited with after 72 years. I advised her that the total time needed for a staged brachiobasilic fistula is roughly 3 months due to needing two operations.  I asked that Karen Myers consider undergoing the first stage soon as the maturation of the fistula takes 6+ weeks. Karen Myers is aware that should Karen Myers decide to wait, this could result in needing a tunneled dialysis catheter for dialysis prior to fistula maturation. Karen Myers stated Karen Myers would call our office with her decision moving forward.  Karen Myers FORBES Rim, MD [facility] [phone]

## 2023-08-31 ENCOUNTER — Ambulatory Visit: Admitting: Vascular Surgery

## 2023-08-31 ENCOUNTER — Ambulatory Visit (HOSPITAL_COMMUNITY): Admission: RE | Admit: 2023-08-31 | Source: Ambulatory Visit

## 2023-08-31 ENCOUNTER — Ambulatory Visit (HOSPITAL_COMMUNITY)

## 2023-09-07 DIAGNOSIS — N185 Chronic kidney disease, stage 5: Secondary | ICD-10-CM | POA: Diagnosis not present

## 2023-09-07 DIAGNOSIS — N189 Chronic kidney disease, unspecified: Secondary | ICD-10-CM | POA: Diagnosis not present

## 2023-09-07 DIAGNOSIS — E7800 Pure hypercholesterolemia, unspecified: Secondary | ICD-10-CM | POA: Diagnosis not present

## 2023-09-07 DIAGNOSIS — Z853 Personal history of malignant neoplasm of breast: Secondary | ICD-10-CM | POA: Diagnosis not present

## 2023-09-17 ENCOUNTER — Encounter (HOSPITAL_COMMUNITY): Payer: Self-pay

## 2023-09-17 ENCOUNTER — Ambulatory Visit (HOSPITAL_COMMUNITY): Payer: Self-pay | Admitting: Physician Assistant

## 2023-09-17 ENCOUNTER — Ambulatory Visit (HOSPITAL_COMMUNITY)
Admission: EM | Admit: 2023-09-17 | Discharge: 2023-09-17 | Disposition: A | Discharge status: Home / Self Care | Attending: Physician Assistant | Admitting: Physician Assistant

## 2023-09-17 DIAGNOSIS — Z940 Kidney transplant status: Secondary | ICD-10-CM | POA: Insufficient documentation

## 2023-09-17 DIAGNOSIS — R011 Cardiac murmur, unspecified: Secondary | ICD-10-CM | POA: Insufficient documentation

## 2023-09-17 DIAGNOSIS — T148XXA Other injury of unspecified body region, initial encounter: Secondary | ICD-10-CM | POA: Diagnosis not present

## 2023-09-17 DIAGNOSIS — L299 Pruritus, unspecified: Secondary | ICD-10-CM | POA: Insufficient documentation

## 2023-09-17 LAB — COMPREHENSIVE METABOLIC PANEL WITH GFR
ALT: 12 U/L (ref 0–44)
AST: 20 U/L (ref 15–41)
Albumin: 3.4 g/dL — ABNORMAL LOW (ref 3.5–5.0)
Alkaline Phosphatase: 100 U/L (ref 38–126)
Anion gap: 17 — ABNORMAL HIGH (ref 5–15)
BUN: 80 mg/dL — ABNORMAL HIGH (ref 8–23)
CO2: 18 mmol/L — ABNORMAL LOW (ref 22–32)
Calcium: 9.5 mg/dL (ref 8.9–10.3)
Chloride: 98 mmol/L (ref 98–111)
Creatinine, Ser: 6.35 mg/dL — ABNORMAL HIGH (ref 0.44–1.00)
GFR, Estimated: 6 mL/min — ABNORMAL LOW (ref >60)
Glucose, Bld: 96 mg/dL (ref 70–99)
Potassium: 4.4 mmol/L (ref 3.5–5.1)
Sodium: 133 mmol/L — ABNORMAL LOW (ref 135–145)
Total Bilirubin: 0.4 mg/dL (ref 0.0–1.2)
Total Protein: 6.1 g/dL — ABNORMAL LOW (ref 6.5–8.1)

## 2023-09-17 LAB — CBC WITH DIFFERENTIAL/PLATELET
Abs Immature Granulocytes: 0.04 K/uL (ref 0.00–0.07)
Basophils Absolute: 0 K/uL (ref 0.0–0.1)
Basophils Relative: 1 %
Eosinophils Absolute: 0 K/uL (ref 0.0–0.5)
Eosinophils Relative: 0 %
HCT: 35.5 % — ABNORMAL LOW (ref 36.0–46.0)
Hemoglobin: 11.5 g/dL — ABNORMAL LOW (ref 12.0–15.0)
Immature Granulocytes: 1 %
Lymphocytes Relative: 8 %
Lymphs Abs: 0.6 K/uL — ABNORMAL LOW (ref 0.7–4.0)
MCH: 31.2 pg (ref 26.0–34.0)
MCHC: 32.4 g/dL (ref 30.0–36.0)
MCV: 96.2 fL (ref 80.0–100.0)
Monocytes Absolute: 0.4 K/uL (ref 0.1–1.0)
Monocytes Relative: 5 %
Neutro Abs: 7.3 K/uL (ref 1.7–7.7)
Neutrophils Relative %: 85 %
Platelets: 256 K/uL (ref 150–400)
RBC: 3.69 MIL/uL — ABNORMAL LOW (ref 3.87–5.11)
RDW: 15.5 % (ref 11.5–15.5)
WBC: 8.4 K/uL (ref 4.0–10.5)
nRBC: 0 % (ref 0.0–0.2)

## 2023-09-17 LAB — TSH: TSH: 1.85 u[IU]/mL (ref 0.350–4.500)

## 2023-09-17 LAB — PROTIME-INR
INR: 1 (ref 0.8–1.2)
Prothrombin Time: 13.3 s (ref 11.4–15.2)

## 2023-09-17 LAB — APTT: aPTT: 33 s (ref 24–36)

## 2023-09-17 NOTE — ED Triage Notes (Signed)
 Pt states that she has a rash on both arms and legs. X1 month

## 2023-09-17 NOTE — ED Provider Notes (Signed)
 [facility]    CSN: [id number] Arrival date & time: 09/17/23  1001      History   Chief Complaint Chief Complaint  Patient presents with   Rash    HPI Karen Myers is a 78 y.o. female.   Patient presents today with a 1 month history of widespread pruritus without associated rash.  She has developed some bruising on upper and lower extremities but reports that she has always had easy bruising because she takes daily aspirin .  She denies any medication changes including to her antirejection medication as she is status post kidney transplant in 2023.  She denies any exposures to plants, insects, animals.  Denies any changes to personal hygiene products including soaps or detergents.  She has not been taking any over-the-counter medications to help manage her symptoms as she is limited what can be taken due to her history of transplant and ESRD.  She denies history of dermatological condition.  She reports that she is status post cholecystectomy several years ago.    Past Medical History:  Diagnosis Date   Arthritis    Breast cancer (HCC)    left breast cancer    Breast cancer (HCC)    Chronic kidney disease    CIN III (cervical intraepithelial neoplasia III)    prior to LEEP procedure   GERD (gastroesophageal reflux disease)    High cholesterol    Hypertension     Patient Active Problem List   Diagnosis Date Noted   Arthralgia of left lower leg 08/03/2020   Primary hyperparathyroidism (HCC) 12/07/2018   Hyperparathyroidism, primary (HCC) 05/27/2017   CIN III (cervical intraepithelial neoplasia III) 10/06/2011   Breast CA (HCC) 12/02/2010   Renal insufficiency 12/02/2010   Hypertension 12/02/2010   Hyperlipidemia 12/02/2010    Past Surgical History:  Procedure Laterality Date   BREAST LUMPECTOMY     CERVICAL BIOPSY  W/ LOOP ELECTRODE EXCISION  2005   CHOLECYSTECTOMY N/A 03/18/2021   Procedure: LAPAROSCOPIC CHOLECYSTECTOMY WITH ICG;  Surgeon: Teresa Lonni HERO, MD;  Location: [facility];  Service: General;  Laterality: N/A;   COLONOSCOPY     HYSTEROSCOPY WITH D & C  2005   MASTECTOMY  2002   bilateral    PARATHYROIDECTOMY N/A 06/01/2017   Procedure: PARATHYROIDECTOMY;  Surgeon: Eletha Boas, MD;  Location: [facility];  Service: General;  Laterality: N/A;   PARATHYROIDECTOMY N/A 12/07/2018   Procedure: NECK EXPLORATION, RIGHT INFERIOR PARATHYROIDECTOMY, LEFT THYROID  LOBECTOMY;  Surgeon: Eletha Boas, MD;  Location: [facility];  Service: General;  Laterality: N/A;   POLYPECTOMY     RENAL BIOPSY     VULVA MARYBETH BIOPSY     nevus on vulva     OB History     Gravida  2   Para  2   Term      Preterm      AB      Living  2      SAB      IAB      Ectopic      Multiple      Live Births               Home Medications    Prior to Admission medications   Medication Sig Start Date End Date Taking? Authorizing Provider  acetaminophen  (TYLENOL ) 500 MG tablet Take 1,000 mg by mouth 2 (two) times daily.   Yes [provider]  amLODipine (NORVASC) 5 MG tablet Take 5 mg by mouth  daily as needed. 08/30/21  Yes [provider]  aspirin  EC 81 MG tablet Take 81 mg by mouth daily.   Yes [provider]  atorvastatin (LIPITOR) 40 MG tablet Take 40 mg by mouth daily.   Yes [provider]  azithromycin  (ZITHROMAX ) 250 MG tablet Take 1 tablet (250 mg total) by mouth daily. Take first 2 tablets together, then 1 every day until finished. 06/24/23  Yes Rolinda Rogue, MD  cyclobenzaprine  (FLEXERIL ) 10 MG tablet Take 1 tablet (10 mg total) by mouth 2 (two) times daily as needed for muscle spasms. 07/03/23  Yes Jha, Panav, MD  DILT-XR 240 MG 24 hr capsule Take 240 mg by mouth daily. 12/09/20  Yes [provider]  fluticasone  (FLONASE ) 50 MCG/ACT nasal spray Place 1 spray into both nostrils daily. 04/26/23  Yes Chandra Raisin A, NP  JANUVIA 25 MG tablet Take 25 mg by mouth daily.   Yes [provider]  pantoprazole  (PROTONIX ) 40 MG tablet Take 40 mg by mouth daily. 08/31/21  Yes [provider]  polyethylene glycol (MIRALAX / GLYCOLAX) 17 g packet Take 17 g by mouth as needed. 08/04/21  Yes [provider]  senna-docusate (SENOKOT-S) 8.6-50 MG tablet Take 1 tablet by mouth as needed. 08/04/21  Yes [provider]  tacrolimus  (PROGRAF ) 1 MG capsule Take 1 mg by mouth in the morning and at bedtime. 08/23/21  Yes [provider]  predniSONE  (DELTASONE ) 5 MG tablet Take 10 mg by mouth 2 (two) times daily. 08/31/21   [provider]  losartan  (COZAAR ) 100 MG tablet Take 100 mg by mouth daily.  03/07/20  [provider]    Family History History reviewed. No pertinent family history.  Social History Social History   Tobacco Use   Smoking status: Former   Smokeless tobacco: Never  Advertising account planner   Vaping status: Never Used  Substance Use Topics   Alcohol use: No   Drug use: No     Allergies   Altace [ramipril], Lisinopril, and Losartan    Review of Systems Review of Systems  Constitutional:  Positive for activity change. Negative for appetite change, fatigue and fever.  Respiratory:  Negative for shortness of breath.   Cardiovascular:  Negative for chest pain.  Gastrointestinal:  Negative for abdominal pain, diarrhea, nausea and vomiting.  Skin:  Positive for color change (Bruising). Negative for wound.  Hematological:  Bruises/bleeds easily.     Physical Exam Triage Vital Signs ED Triage Vitals  Encounter Vitals Group     BP 09/17/23 1014 (!) 155/68     Girls Systolic BP Percentile --      Girls Diastolic BP Percentile --      Boys Systolic BP Percentile --      Boys Diastolic BP Percentile --      Pulse Rate 09/17/23 1014 84     Resp 09/17/23 1014 17     Temp 09/17/23 1014 97.8 F (36.6 C)     Temp Source 09/17/23 1014 Oral     SpO2 09/17/23 1014 96 %     Weight 09/17/23 1013 127 lb (57.6 kg)     Height  09/17/23 1013 5' 7 (1.702 m)     Head Circumference --      Peak Flow --      Pain Score 09/17/23 1013 0     Pain Loc --      Pain Education --      Exclude from Growth Chart --  No data found.  Updated Vital Signs BP (!) 155/68 (BP Location: Right Arm)   Pulse 84   Temp 97.8 F (36.6 C) (Oral)   Resp 17   Ht 5' 7 (1.702 m)   Wt 127 lb (57.6 kg)   SpO2 96%   BMI 19.89 kg/m   Visual Acuity Right Eye Distance:   Left Eye Distance:   Bilateral Distance:    Right Eye Near:   Left Eye Near:    Bilateral Near:     Physical Exam Vitals reviewed.  Constitutional:      General: She is awake. She is not in acute distress.    Appearance: Normal appearance. She is well-developed. She is not ill-appearing.     Comments: Very pleasant female appears stated age in no acute distress sitting comfortably in exam room  HENT:     Head: Normocephalic and atraumatic.     Mouth/Throat:     Pharynx: Uvula midline. No oropharyngeal exudate or posterior oropharyngeal erythema.  Cardiovascular:     Rate and Rhythm: Normal rate and regular rhythm.     Heart sounds: S1 normal and S2 normal. Murmur heard.  Pulmonary:     Effort: Pulmonary effort is normal.     Breath sounds: Normal breath sounds. No wheezing, rhonchi or rales.     Comments: Clear to auscultation bilaterally Musculoskeletal:     Right lower leg: No edema.     Left lower leg: No edema.  Skin:    Findings: Bruising present. No rash.         Comments: Multiple bruises noted upper and lower extremities in various stages of healing with overlying excoriation.  Psychiatric:        Behavior: Behavior is cooperative.      UC Treatments / Results  Labs (all labs ordered are listed, but only abnormal results are displayed) Labs Reviewed  CBC WITH DIFFERENTIAL/PLATELET  COMPREHENSIVE METABOLIC PANEL WITH GFR  TSH  PROTIME-INR  APTT    EKG   Radiology No results found.  Procedures Procedures (including  critical care time)  Medications Ordered in UC Medications - No data to display  Initial Impression / Assessment and Plan / UC Course  I have reviewed the triage vital signs and the nursing notes.  Pertinent labs & imaging results that were available during my care of the patient were reviewed by me and considered in my medical decision making (see chart for details).     Patient is well-appearing, afebrile, nontoxic, nontachycardic.  I suspect that the bruising is related to excoriation but will obtain basic blood work to investigate easy bruising including CBC, PTT, PT INR.  I suspect that her symptoms are related to underlying end-stage renal disease particular she does not have a rash but we will investigate other causes.  CBC, CMP, TSH were obtained and are pending and we will contact her with additional instructions.  We discussed that given her history of end-stage renal disease we are limited in the oral medications that can be used to treat pruritus.  Recommended that she use hypoallergenic soaps and detergents and applying emollients several times per day.  She is to follow-up with her primary care soon as possible for further evaluation and management.  We discussed that if she develops any rash, fever, shortness of breath, leg swelling, nausea, vomiting she needs to be seen immediately.  Should return precautions given.  All questions were answered patient satisfaction.  Murmur auscultated on exam.  Patient was unaware  of this and recommended that she follow-up with her primary care first thing next week for reevaluation and consideration of referral to cardiology for echocardiogram.  Final Clinical Impressions(s) / UC Diagnoses   Final diagnoses:  Pruritus  Bruising  History of kidney transplant  Newly recognized heart murmur     Discharge Instructions      I will contact you if any of your blood work is abnormal and helps us  figure out how best to treat your symptoms.  In  the meantime, I recommend using hypoallergenic soaps and detergents and an emollient such as Aquaphor to help keep your skin moisturized.  Follow-up with your primary care first thing next week for further evaluation and management.  If you develop any additional symptoms including rash, fever, nausea, vomiting you should be seen immediately.  I believe that I heard a murmur.  Please follow-up with your primary care next week so that they can investigate this further.  If you develop any chest pain, shortness of breath, leg swelling you need to be seen immediately.     ED Prescriptions   None    PDMP not reviewed this encounter.   Sherrell Rocky POUR, PA-C 09/17/23 1105

## 2023-09-17 NOTE — Discharge Instructions (Addendum)
 I will contact you if any of your blood work is abnormal and helps us  figure out how best to treat your symptoms.  In the meantime, I recommend using hypoallergenic soaps and detergents and an emollient such as Aquaphor to help keep your skin moisturized.  Follow-up with your primary care first thing next week for further evaluation and management.  If you develop any additional symptoms including rash, fever, nausea, vomiting you should be seen immediately.  I believe that I heard a murmur.  Please follow-up with your primary care next week so that they can investigate this further.  If you develop any chest pain, shortness of breath, leg swelling you need to be seen immediately.

## 2023-09-27 DIAGNOSIS — D84821 Immunodeficiency due to drugs: Secondary | ICD-10-CM | POA: Diagnosis not present

## 2023-09-27 DIAGNOSIS — Z940 Kidney transplant status: Secondary | ICD-10-CM | POA: Diagnosis not present

## 2023-09-27 DIAGNOSIS — D649 Anemia, unspecified: Secondary | ICD-10-CM | POA: Diagnosis not present

## 2023-09-27 DIAGNOSIS — Z79899 Other long term (current) drug therapy: Secondary | ICD-10-CM | POA: Diagnosis not present

## 2023-09-27 DIAGNOSIS — I10 Essential (primary) hypertension: Secondary | ICD-10-CM | POA: Diagnosis not present

## 2023-09-27 DIAGNOSIS — N185 Chronic kidney disease, stage 5: Secondary | ICD-10-CM | POA: Diagnosis not present

## 2023-09-27 DIAGNOSIS — E611 Iron deficiency: Secondary | ICD-10-CM | POA: Diagnosis not present

## 2023-10-03 ENCOUNTER — Other Ambulatory Visit (HOSPITAL_COMMUNITY): Payer: Self-pay | Admitting: Nephrology

## 2023-10-03 DIAGNOSIS — N185 Chronic kidney disease, stage 5: Secondary | ICD-10-CM

## 2023-10-04 ENCOUNTER — Other Ambulatory Visit (HOSPITAL_COMMUNITY): Payer: Self-pay | Admitting: Radiology

## 2023-10-04 DIAGNOSIS — C50919 Malignant neoplasm of unspecified site of unspecified female breast: Secondary | ICD-10-CM

## 2023-10-05 ENCOUNTER — Other Ambulatory Visit (HOSPITAL_COMMUNITY): Payer: Self-pay | Admitting: Nephrology

## 2023-10-05 ENCOUNTER — Ambulatory Visit (HOSPITAL_COMMUNITY)
Admission: RE | Admit: 2023-10-05 | Discharge: 2023-10-05 | Disposition: A | Discharge status: Home / Self Care | Source: Ambulatory Visit | Attending: Nephrology | Admitting: Nephrology

## 2023-10-05 ENCOUNTER — Other Ambulatory Visit: Payer: Self-pay

## 2023-10-05 DIAGNOSIS — Z853 Personal history of malignant neoplasm of breast: Secondary | ICD-10-CM | POA: Insufficient documentation

## 2023-10-05 DIAGNOSIS — Z9013 Acquired absence of bilateral breasts and nipples: Secondary | ICD-10-CM | POA: Insufficient documentation

## 2023-10-05 DIAGNOSIS — N042 Nephrotic syndrome with diffuse membranous glomerulonephritis, unspecified: Secondary | ICD-10-CM | POA: Diagnosis not present

## 2023-10-05 DIAGNOSIS — Z87891 Personal history of nicotine dependence: Secondary | ICD-10-CM | POA: Diagnosis not present

## 2023-10-05 DIAGNOSIS — I120 Hypertensive chronic kidney disease with stage 5 chronic kidney disease or end stage renal disease: Secondary | ICD-10-CM | POA: Insufficient documentation

## 2023-10-05 DIAGNOSIS — N186 End stage renal disease: Secondary | ICD-10-CM | POA: Diagnosis not present

## 2023-10-05 DIAGNOSIS — Z79899 Other long term (current) drug therapy: Secondary | ICD-10-CM | POA: Insufficient documentation

## 2023-10-05 DIAGNOSIS — N185 Chronic kidney disease, stage 5: Secondary | ICD-10-CM

## 2023-10-05 DIAGNOSIS — Z992 Dependence on renal dialysis: Secondary | ICD-10-CM | POA: Diagnosis not present

## 2023-10-05 DIAGNOSIS — D631 Anemia in chronic kidney disease: Secondary | ICD-10-CM | POA: Diagnosis not present

## 2023-10-05 DIAGNOSIS — D84821 Immunodeficiency due to drugs: Secondary | ICD-10-CM | POA: Diagnosis not present

## 2023-10-05 DIAGNOSIS — K219 Gastro-esophageal reflux disease without esophagitis: Secondary | ICD-10-CM | POA: Insufficient documentation

## 2023-10-05 DIAGNOSIS — C50919 Malignant neoplasm of unspecified site of unspecified female breast: Secondary | ICD-10-CM

## 2023-10-05 DIAGNOSIS — B259 Cytomegaloviral disease, unspecified: Secondary | ICD-10-CM | POA: Diagnosis not present

## 2023-10-05 DIAGNOSIS — Z940 Kidney transplant status: Secondary | ICD-10-CM | POA: Diagnosis not present

## 2023-10-05 DIAGNOSIS — E213 Hyperparathyroidism, unspecified: Secondary | ICD-10-CM | POA: Diagnosis not present

## 2023-10-05 HISTORY — PX: IR TUNNELED CENTRAL VENOUS CATH PLC W IMG: IMG1939

## 2023-10-05 MED ORDER — FENTANYL CITRATE (PF) 100 MCG/2ML IJ SOLN
INTRAMUSCULAR | Status: AC | PRN
  Administered 2023-10-05 (×2): 25 ug via INTRAVENOUS

## 2023-10-05 MED ORDER — SODIUM CHLORIDE 0.9 % IV SOLN: INTRAVENOUS | Status: DC

## 2023-10-05 MED ORDER — MIDAZOLAM HCL 2 MG/2ML IJ SOLN
INTRAMUSCULAR | Status: AC
  Filled 2023-10-05: qty 2

## 2023-10-05 MED ORDER — LIDOCAINE-EPINEPHRINE 1 %-1:100000 IJ SOLN
INTRAMUSCULAR | Status: AC
  Filled 2023-10-05: qty 1

## 2023-10-05 MED ORDER — HEPARIN SODIUM (PORCINE) 1000 UNIT/ML IJ SOLN
INTRAMUSCULAR | Status: AC
  Filled 2023-10-05: qty 10

## 2023-10-05 MED ORDER — CEFAZOLIN SODIUM-DEXTROSE 2-4 GM/100ML-% IV SOLN
INTRAVENOUS | Status: AC
Start: 2023-10-05 — End: 2023-10-05
  Filled 2023-10-05: qty 100

## 2023-10-05 MED ORDER — FENTANYL CITRATE (PF) 100 MCG/2ML IJ SOLN
INTRAMUSCULAR | Status: AC
  Filled 2023-10-05: qty 2

## 2023-10-05 MED ORDER — CEFAZOLIN SODIUM-DEXTROSE 2-4 GM/100ML-% IV SOLN
INTRAVENOUS | Status: AC | PRN
  Administered 2023-10-05: 2 g via INTRAVENOUS

## 2023-10-05 MED ORDER — LIDOCAINE HCL 1 % IJ SOLN
20.0000 mL | Freq: Once | INTRAMUSCULAR | Status: AC
  Administered 2023-10-05: 20 mL

## 2023-10-05 MED ORDER — MIDAZOLAM HCL 2 MG/2ML IJ SOLN
INTRAMUSCULAR | Status: AC | PRN
Start: 2023-10-05 — End: 2023-10-05
  Administered 2023-10-05: 1 mg via INTRAVENOUS

## 2023-10-05 NOTE — Progress Notes (Signed)
 Chief Complaint: ESRD  Referring Provider(s): Coladonato,Joseph   Supervising Physician: Jenna Hacker  Patient Status: [facility] - Out-pt  History of Present Illness: Karen Myers is a 78 y.o. female with history of L BCA (s/p bilateral mastectomy), GERD, HTN, ESRD, cholecystectomy. She is independent with ADLs and cares for her husband at home.   Confirms NPO since MN and ride/supervision available for 24 hours.  Does not wear CPAP or use supplemental home O2.  Denies fever, chills, SOB, CP, sore throat, nausea, abd pain, blood in stool or urine, abnormal bruising, leg swelling, back pain. She does endorse vomiting which is baseline for her.   Allergies Reviewed:  Altace [ramipril], Lisinopril, and Losartan    Patient is Full Code  Past Medical History:  Diagnosis Date   Arthritis    Breast cancer (HCC)    left breast cancer    Breast cancer (HCC)    Chronic kidney disease    CIN III (cervical intraepithelial neoplasia III)    prior to LEEP procedure   GERD (gastroesophageal reflux disease)    High cholesterol    Hypertension     Past Surgical History:  Procedure Laterality Date   BREAST LUMPECTOMY     CERVICAL BIOPSY  W/ LOOP ELECTRODE EXCISION  2005   CHOLECYSTECTOMY N/A 03/18/2021   Procedure: LAPAROSCOPIC CHOLECYSTECTOMY WITH ICG;  Surgeon: Teresa Lonni HERO, MD;  Location: [facility] OR;  Service: General;  Laterality: N/A;   COLONOSCOPY     HYSTEROSCOPY WITH D & C  2005   MASTECTOMY  2002   bilateral    PARATHYROIDECTOMY N/A 06/01/2017   Procedure: PARATHYROIDECTOMY;  Surgeon: Eletha Boas, MD;  Location: [facility] ORS;  Service: General;  Laterality: N/A;   PARATHYROIDECTOMY N/A 12/07/2018   Procedure: NECK EXPLORATION, RIGHT INFERIOR PARATHYROIDECTOMY, LEFT THYROID  LOBECTOMY;  Surgeon: Eletha Boas, MD;  Location: [facility] ORS;  Service: General;  Laterality: N/A;   POLYPECTOMY     RENAL BIOPSY     VULVA MARYBETH BIOPSY     nevus on vulva        Medications: Prior to Admission medications   Medication Sig Start Date End Date Taking? Authorizing Provider  acetaminophen  (TYLENOL ) 500 MG tablet Take 1,000 mg by mouth 2 (two) times daily.   Yes [provider]  amLODipine (NORVASC) 5 MG tablet Take 5 mg by mouth daily as needed. 08/30/21  Yes [provider]  aspirin  EC 81 MG tablet Take 81 mg by mouth daily.   Yes [provider]  atorvastatin (LIPITOR) 40 MG tablet Take 40 mg by mouth daily.   Yes [provider]  DILT-XR 240 MG 24 hr capsule Take 240 mg by mouth daily. 12/09/20  Yes [provider]  fluticasone  (FLONASE ) 50 MCG/ACT nasal spray Place 1 spray into both nostrils daily. 04/26/23  Yes Chandra Raisin A, NP  JANUVIA 25 MG tablet Take 25 mg by mouth daily.   Yes [provider]  predniSONE  (DELTASONE ) 5 MG tablet Take 10 mg by mouth 2 (two) times daily. 08/31/21  Yes [provider]  tacrolimus  (PROGRAF ) 1 MG capsule Take 1 mg by mouth in the morning and at bedtime. 08/23/21  Yes [provider]  azithromycin  (ZITHROMAX ) 250 MG tablet Take 1 tablet (250 mg total) by mouth daily. Take first 2 tablets together, then 1 every day until finished. 06/24/23   Rolinda Rogue, MD  cyclobenzaprine  (FLEXERIL ) 10 MG tablet Take 1 tablet (10 mg total) by mouth 2 (two) times daily  as needed for muscle spasms. 07/03/23   Jha, Panav, MD  pantoprazole  (PROTONIX ) 40 MG tablet Take 40 mg by mouth daily. 08/31/21   [provider]  polyethylene glycol (MIRALAX / GLYCOLAX) 17 g packet Take 17 g by mouth as needed. 08/04/21   [provider]  senna-docusate (SENOKOT-S) 8.6-50 MG tablet Take 1 tablet by mouth as needed. 08/04/21   [provider]  losartan  (COZAAR ) 100 MG tablet Take 100 mg by mouth daily.  03/07/20  [provider]     No family history on file.  Social History   Socioeconomic History   Marital status: Married    Spouse  name: Not on file   Number of children: Not on file   Years of education: Not on file   Highest education level: Not on file  Occupational History   Not on file  Tobacco Use   Smoking status: Former   Smokeless tobacco: Never  Vaping Use   Vaping status: Never Used  Substance and Sexual Activity   Alcohol use: No   Drug use: No   Sexual activity: Yes  Other Topics Concern   Not on file  Social History Narrative   Not on file   Social Drivers of Health   Financial Resource Strain: Not on file  Food Insecurity: Low Risk  (09/27/2023)   Received from [facility]   Hunger Vital Sign    Within the past 12 months, you worried that your food would run out before you got money to buy more: Never true    Within the past 12 months, the food you bought just didn't last and you didn't have money to get more. : Never true  Transportation Needs: No Transportation Needs (09/27/2023)   Received from [facility]    In the past 12 months, has lack of reliable transportation kept you from medical appointments, meetings, work or from getting things needed for daily living? : No  Physical Activity: Not on file  Stress: Not on file  Social Connections: Not on file     Review of Systems: A 12 point ROS discussed and pertinent positives are indicated in the HPI above.  All other systems are negative.    Vital Signs: BP (!) 159/105   Pulse 74   Temp 97.6 F (36.4 C) (Oral)   Resp 15   Ht 5' 7 (1.702 m)   Wt 125 lb (56.7 kg)   SpO2 100%   BMI 19.58 kg/m   Advance Care Plan: No documents    Physical Exam HENT:     Mouth/Throat:     Mouth: Mucous membranes are moist.     Pharynx: Oropharynx is clear.  Cardiovascular:     Rate and Rhythm: Normal rate and regular rhythm.     Pulses: Normal pulses.  Pulmonary:     Effort: Pulmonary effort is normal.  Abdominal:     General: There is no distension.     Palpations: Abdomen is soft.  Skin:    General: Skin is  warm and dry.     Comments: No rash or wounds over planned puncture  Neurological:     Mental Status: She is alert and oriented to person, place, and time.  Psychiatric:        Mood and Affect: Mood normal.        Behavior: Behavior normal.        Thought Content: Thought content normal.  Judgment: Judgment normal.     Imaging: No results found.  Labs:  CBC: Recent Labs    09/17/23 1033  WBC 8.4  HGB 11.5*  HCT 35.5*  PLT 256    COAGS: Recent Labs    09/17/23 1033  INR 1.0  APTT 33    BMP: Recent Labs    09/17/23 1033  NA 133*  K 4.4  CL 98  CO2 18*  GLUCOSE 96  BUN 80*  CALCIUM 9.5  CREATININE 6.35*  GFRNONAA 6*    LIVER FUNCTION TESTS: Recent Labs    09/17/23 1033  BILITOT 0.4  AST 20  ALT 12  ALKPHOS 100  PROT 6.1*  ALBUMIN 3.4*    TUMOR MARKERS: No results for input(s): AFPTM, CEA, CA199, CHROMGRNA in the last 8760 hours.  Assessment and Plan:  Request for  image guided tunneled HD catheter placement No contraindications for procedure identified in ROS, physical exam, or review of pre-sedation considerations. Labs reviewed and within acceptable range VSS, afebrile  Patient not asked to hold any AC/AP for this low bleeding risk procedure Abx: ancef     Risks and benefits discussed with the patient including, but not limited to bleeding, infection, vascular injury, pneumothorax which may require chest tube placement, air embolism or even death  All of the patient's questions were answered, patient is agreeable to proceed. Consent signed and in chart.   Thank you for allowing our service to participate in EVANGALINE JOU 's care.    Electronically Signed: Laymon Coast, NP   10/05/2023, 7:56 AM     I spent a total of  15 Minutes   in face to face in clinical consultation, greater than 50% of which was counseling/coordinating care for image guided  [facility] placement.    (A copy of this note was sent to the  referring provider and the time of visit.)

## 2023-10-05 NOTE — Progress Notes (Signed)
 Spoke with Karen Myers in Radiology that patient has not had BP meds this AM and BP 161/100. No new orders at this time - she stated we are okay to proceed.

## 2023-10-05 NOTE — Procedures (Signed)
 Interventional Radiology Procedure Note  Procedure: Tunneled hemodialysis catheter placement  Complications: None  Estimated Blood Loss: < 10 mL  Findings: Right internal jugular vein placement of tunneled hemodialysis catheter.  Cordella DELENA Banner, MD

## 2023-10-07 ENCOUNTER — Other Ambulatory Visit: Payer: Self-pay

## 2023-10-07 ENCOUNTER — Emergency Department (HOSPITAL_COMMUNITY)

## 2023-10-07 ENCOUNTER — Emergency Department (HOSPITAL_COMMUNITY)
Admission: EM | Admit: 2023-10-07 | Discharge: 2023-10-07 | Disposition: A | Discharge status: Home / Self Care | Attending: Emergency Medicine | Admitting: Emergency Medicine

## 2023-10-07 ENCOUNTER — Encounter (HOSPITAL_COMMUNITY): Payer: Self-pay

## 2023-10-07 DIAGNOSIS — I700 Atherosclerosis of aorta: Secondary | ICD-10-CM | POA: Diagnosis not present

## 2023-10-07 DIAGNOSIS — R7989 Other specified abnormal findings of blood chemistry: Secondary | ICD-10-CM | POA: Diagnosis not present

## 2023-10-07 DIAGNOSIS — Z992 Dependence on renal dialysis: Secondary | ICD-10-CM | POA: Insufficient documentation

## 2023-10-07 DIAGNOSIS — Z853 Personal history of malignant neoplasm of breast: Secondary | ICD-10-CM | POA: Insufficient documentation

## 2023-10-07 DIAGNOSIS — I120 Hypertensive chronic kidney disease with stage 5 chronic kidney disease or end stage renal disease: Secondary | ICD-10-CM | POA: Diagnosis not present

## 2023-10-07 DIAGNOSIS — N186 End stage renal disease: Secondary | ICD-10-CM | POA: Insufficient documentation

## 2023-10-07 DIAGNOSIS — R0789 Other chest pain: Secondary | ICD-10-CM | POA: Diagnosis not present

## 2023-10-07 DIAGNOSIS — R110 Nausea: Secondary | ICD-10-CM | POA: Diagnosis not present

## 2023-10-07 DIAGNOSIS — R531 Weakness: Secondary | ICD-10-CM | POA: Diagnosis not present

## 2023-10-07 LAB — BASIC METABOLIC PANEL WITH GFR
Anion gap: 16 — ABNORMAL HIGH (ref 5–15)
BUN: 80 mg/dL — ABNORMAL HIGH (ref 8–23)
CO2: 23 mmol/L (ref 22–32)
Calcium: 9.7 mg/dL (ref 8.9–10.3)
Chloride: 100 mmol/L (ref 98–111)
Creatinine, Ser: 7.71 mg/dL — ABNORMAL HIGH (ref 0.44–1.00)
GFR, Estimated: 5 mL/min — ABNORMAL LOW (ref >60)
Glucose, Bld: 82 mg/dL (ref 70–99)
Potassium: 3.3 mmol/L — ABNORMAL LOW (ref 3.5–5.1)
Sodium: 139 mmol/L (ref 135–145)

## 2023-10-07 LAB — CBC
HCT: 37 % (ref 36.0–46.0)
Hemoglobin: 11.6 g/dL — ABNORMAL LOW (ref 12.0–15.0)
MCH: 30.9 pg (ref 26.0–34.0)
MCHC: 31.4 g/dL (ref 30.0–36.0)
MCV: 98.7 fL (ref 80.0–100.0)
Platelets: 241 K/uL (ref 150–400)
RBC: 3.75 MIL/uL — ABNORMAL LOW (ref 3.87–5.11)
RDW: 15.1 % (ref 11.5–15.5)
WBC: 7.8 K/uL (ref 4.0–10.5)
nRBC: 0 % (ref 0.0–0.2)

## 2023-10-07 LAB — TROPONIN I (HIGH SENSITIVITY)
Troponin I (High Sensitivity): 114 ng/L (ref <18)
Troponin I (High Sensitivity): 105 ng/L (ref <18)

## 2023-10-07 NOTE — ED Triage Notes (Signed)
 Patient presents from home for weakness, nausea, chest pressure that has been gradually getting worse this week.   She has recently experienced worsening kidney function such that nephrology recommended R HD cath placement for HD which ws completed in IR this week.   She has an upcoming appt to start HD this week but has started feeling worse in the meantime. Concerned that she may need her HD treatment sooner that this.

## 2023-10-07 NOTE — ED Provider Notes (Addendum)
 [place] EMERGENCY DEPARTMENT AT [facility] Provider Note   CSN: [id number] Arrival date & time: 10/07/23  9082     Patient presents with: Weakness   Karen Myers is a 78 y.o. female.   Patient status post renal transplant in 2023 followed over at [facility].  Patient's had progressing renal failure.  Had catheter placed recently for preparation for dialysis on Wednesday.  Followed by nephrology here locally.  Patient had her son visit from [place] .  He was concerned about her not feeling well.  Patient has felt fatigued.  Patient last saw all nephrology at [facility] on August 20.  Patient denies any shortness of breath.  Patient did have some chest discomfort anterior part of the chest yesterday.  None today.  Patient was just thinking maybe she was going to need dialysis before Wednesday.  That is why she came in.  Yesterday she had the chest pressure for the past several days she has had some weakness and nausea.  No leg swelling.  Past medical history of for high cholesterol hypertension chronic kidney disease history of breast cancer of the left breast.  Patient last seen locally on August 10 for pruritus.         Prior to Admission medications   Medication Sig Start Date End Date Taking? Authorizing Provider  acetaminophen  (TYLENOL ) 500 MG tablet Take 1,000 mg by mouth 2 (two) times daily.    [provider]  amLODipine (NORVASC) 5 MG tablet Take 5 mg by mouth daily as needed. 08/30/21   [provider]  aspirin  EC 81 MG tablet Take 81 mg by mouth daily.    [provider]  atorvastatin (LIPITOR) 40 MG tablet Take 40 mg by mouth daily.    [provider]  azithromycin  (ZITHROMAX ) 250 MG tablet Take 1 tablet (250 mg total) by mouth daily. Take first 2 tablets together, then 1 every day until finished. 06/24/23   Rolinda Rogue, MD  cyclobenzaprine  (FLEXERIL ) 10 MG tablet Take 1 tablet (10 mg total) by mouth 2 (two) times daily as  needed for muscle spasms. 07/03/23   Jha, Panav, MD  DILT-XR 240 MG 24 hr capsule Take 240 mg by mouth daily. 12/09/20   [provider]  fluticasone  (FLONASE ) 50 MCG/ACT nasal spray Place 1 spray into both nostrils daily. 04/26/23   Chandra Harlene LABOR, NP  JANUVIA 25 MG tablet Take 25 mg by mouth daily.    [provider]  pantoprazole  (PROTONIX ) 40 MG tablet Take 40 mg by mouth daily. 08/31/21   [provider]  polyethylene glycol (MIRALAX / GLYCOLAX) 17 g packet Take 17 g by mouth as needed. 08/04/21   [provider]  predniSONE  (DELTASONE ) 5 MG tablet Take 10 mg by mouth 2 (two) times daily. 08/31/21   [provider]  senna-docusate (SENOKOT-S) 8.6-50 MG tablet Take 1 tablet by mouth as needed. 08/04/21   [provider]  tacrolimus  (PROGRAF ) 1 MG capsule Take 1 mg by mouth in the morning and at bedtime. 08/23/21   [provider]  losartan  (COZAAR ) 100 MG tablet Take 100 mg by mouth daily.  03/07/20  [provider]    Allergies: Altace [ramipril], Lisinopril, and Losartan     Review of Systems  Constitutional:  Positive for fatigue. Negative for chills and fever.  HENT:  Negative for ear pain and sore throat.   Eyes:  Negative for pain and visual disturbance.  Respiratory:  Negative for cough and shortness of  breath.   Cardiovascular:  Positive for chest pain. Negative for palpitations.  Gastrointestinal:  Positive for nausea. Negative for abdominal pain and vomiting.  Genitourinary:  Negative for dysuria and hematuria.  Musculoskeletal:  Negative for arthralgias and back pain.  Skin:  Negative for color change and rash.  Neurological:  Negative for seizures and syncope.  All other systems reviewed and are negative.   Updated Vital Signs BP 129/83 (BP Location: Left Arm)   Pulse 95   Temp (!) 97.5 F (36.4 C)   Resp 18   Ht 1.702 m (5' 7)   Wt 57 kg   SpO2 98%   BMI 19.68 kg/m   Physical Exam Vitals and  nursing note reviewed.  Constitutional:      General: She is not in acute distress.    Appearance: Normal appearance. She is well-developed.  HENT:     Head: Normocephalic and atraumatic.     Mouth/Throat:     Mouth: Mucous membranes are moist.  Eyes:     Conjunctiva/sclera: Conjunctivae normal.  Cardiovascular:     Rate and Rhythm: Normal rate and regular rhythm.     Heart sounds: No murmur heard. Pulmonary:     Effort: Pulmonary effort is normal. No respiratory distress.     Breath sounds: Normal breath sounds.     Comments: Dialysis catheter right anterior chest Abdominal:     Palpations: Abdomen is soft.     Tenderness: There is no abdominal tenderness.  Musculoskeletal:        General: No swelling.     Cervical back: Neck supple.     Right lower leg: No edema.     Left lower leg: No edema.  Skin:    General: Skin is warm and dry.     Capillary Refill: Capillary refill takes less than 2 seconds.  Neurological:     General: No focal deficit present.     Mental Status: She is alert and oriented to person, place, and time.     Cranial Nerves: No cranial nerve deficit.     Sensory: No sensory deficit.     Motor: No weakness.  Psychiatric:        Mood and Affect: Mood normal.     (all labs ordered are listed, but only abnormal results are displayed) Labs Reviewed  BASIC METABOLIC PANEL WITH GFR - Abnormal; Notable for the following components:      Result Value   Potassium 3.3 (*)    BUN 80 (*)    Creatinine, Ser 7.71 (*)    GFR, Estimated 5 (*)    Anion gap 16 (*)    All other components within normal limits  CBC - Abnormal; Notable for the following components:   RBC 3.75 (*)    Hemoglobin 11.6 (*)    All other components within normal limits  TROPONIN I (HIGH SENSITIVITY) - Abnormal; Notable for the following components:   Troponin I (High Sensitivity) 114 (*)    All other components within normal limits  TROPONIN I (HIGH SENSITIVITY)    EKG: EKG  Interpretation Date/Time:  Saturday October 07 2023 09:32:29 EDT Ventricular Rate:  95 PR Interval:  118 QRS Duration:  118 QT Interval:  412 QTC Calculation: 517 R Axis:   17  Text Interpretation: Normal sinus rhythm Right atrial enlargement Left ventricular hypertrophy with QRS widening and repolarization abnormality ( R in aVL , Cornell product , Romhilt-Estes ) Cannot rule out Septal infarct , age undetermined Prolonged QT  Abnormal ECG When compared with ECG of 18-Mar-2021 04:16, PREVIOUS ECG IS PRESENT Confirmed by Joannah Gitlin [phone]) on 10/07/2023 12:01:46 PM  Radiology: DG Chest 2 View Result Date: 10/07/2023 EXAM: 2 VIEW(S) XRAY OF THE CHEST 10/07/2023 10:09:00 AM COMPARISON: 06/24/2023 CLINICAL HISTORY: Chest pain. Patient presents from home for weakness, nausea, chest pressure that has been gradually getting worse this week. She has recently experienced worsening kidney function such that nephrology recommended right hemodialysis catheter placement for hemodialysis which was completed in IR this week. She has an upcoming appointment to start hemodialysis this week but has started feeling worse in the meantime. Concerned that she may need her hemodialysis treatment sooner than this. FINDINGS: LUNGS AND PLEURA: No focal pulmonary opacity. No pulmonary edema. No pleural effusion. No pneumothorax. HEART AND MEDIASTINUM: No acute abnormality of the cardiac and mediastinal silhouettes. BONES AND SOFT TISSUES: No acute osseous abnormality. Aortic atherosclerosis. LINES AND TUBES: Tunneled right internal jugular hemodialysis catheter to the distal superior vena cava. No pneumothorax OTHER FINDINGS: Cholecystectomy clips. IMPRESSION: 1. No acute cardiopulmonary pathology. Electronically signed by: Dayne Hassell MD 10/07/2023 10:14 AM EDT RP Workstation: [id number]     Procedures   Medications Ordered in the ED - No data to display                                  Medical Decision  Making Amount and/or Complexity of Data Reviewed Labs: ordered. Radiology: ordered.   Patient potassium is actually low at 3.3.  GFR 5 consistent with renal failure creatinine at 7.71.  CBC hemoglobin 11.6 white count 7.8.  Initial troponin 114 this could be baseline for her.  But clearly needs delta troponin.  2 view chest no acute cardiopulmonary pathology no evidence of any volume overload.  Patient's oxygen saturations are 98% on room air as well  Patient without any urgent need for dialysis.  Stable to follow-up with nephrology as scheduled for Wednesday for first dialysis session.  Delta troponin was 105 which is down from 114.  Patient stable for discharge home.  Precautions provided.  No evidence of any acute cardiac event.   Final diagnoses:  ESRD (end stage renal disease) (HCC)  Elevated troponin    ED Discharge Orders     None          Geraldene Hamilton, MD 10/07/23 1227    Geraldene Hamilton, MD 10/07/23 1430

## 2023-10-07 NOTE — ED Notes (Signed)
 ED provider aware of critical lab value Trop 114.

## 2023-10-07 NOTE — Discharge Instructions (Signed)
 Follow-up with dialysis scheduled for Wednesday.  Return for any new or worse symptoms.  Troponins were stable.  No evidence of any acute cardiac event.  Also make an appointment to follow-up with your primary care doctor.

## 2023-10-08 DIAGNOSIS — N189 Chronic kidney disease, unspecified: Secondary | ICD-10-CM | POA: Diagnosis not present

## 2023-10-08 DIAGNOSIS — Z853 Personal history of malignant neoplasm of breast: Secondary | ICD-10-CM | POA: Diagnosis not present

## 2023-10-08 DIAGNOSIS — E7800 Pure hypercholesterolemia, unspecified: Secondary | ICD-10-CM | POA: Diagnosis not present

## 2023-10-08 DIAGNOSIS — N185 Chronic kidney disease, stage 5: Secondary | ICD-10-CM | POA: Diagnosis not present

## 2023-10-09 ENCOUNTER — Encounter (HOSPITAL_COMMUNITY): Payer: Self-pay

## 2023-10-09 ENCOUNTER — Emergency Department (HOSPITAL_COMMUNITY): Admission: EM | Admit: 2023-10-09 | Discharge: 2023-10-09 | Disposition: A | Discharge status: Home / Self Care

## 2023-10-09 ENCOUNTER — Other Ambulatory Visit: Payer: Self-pay

## 2023-10-09 DIAGNOSIS — Z853 Personal history of malignant neoplasm of breast: Secondary | ICD-10-CM | POA: Diagnosis not present

## 2023-10-09 DIAGNOSIS — R58 Hemorrhage, not elsewhere classified: Secondary | ICD-10-CM | POA: Diagnosis not present

## 2023-10-09 DIAGNOSIS — N189 Chronic kidney disease, unspecified: Secondary | ICD-10-CM | POA: Insufficient documentation

## 2023-10-09 DIAGNOSIS — T82838A Hemorrhage of vascular prosthetic devices, implants and grafts, initial encounter: Secondary | ICD-10-CM | POA: Diagnosis not present

## 2023-10-09 DIAGNOSIS — T82898A Other specified complication of vascular prosthetic devices, implants and grafts, initial encounter: Secondary | ICD-10-CM | POA: Diagnosis not present

## 2023-10-09 DIAGNOSIS — Y841 Kidney dialysis as the cause of abnormal reaction of the patient, or of later complication, without mention of misadventure at the time of the procedure: Secondary | ICD-10-CM | POA: Insufficient documentation

## 2023-10-09 DIAGNOSIS — T829XXA Unspecified complication of cardiac and vascular prosthetic device, implant and graft, initial encounter: Secondary | ICD-10-CM

## 2023-10-09 LAB — CBC WITH DIFFERENTIAL/PLATELET
Abs Immature Granulocytes: 0.03 K/uL (ref 0.00–0.07)
Basophils Absolute: 0 K/uL (ref 0.0–0.1)
Basophils Relative: 0 %
Eosinophils Absolute: 0 K/uL (ref 0.0–0.5)
Eosinophils Relative: 0 %
HCT: 35.1 % — ABNORMAL LOW (ref 36.0–46.0)
Hemoglobin: 11.2 g/dL — ABNORMAL LOW (ref 12.0–15.0)
Immature Granulocytes: 0 %
Lymphocytes Relative: 11 %
Lymphs Abs: 0.8 K/uL (ref 0.7–4.0)
MCH: 31.2 pg (ref 26.0–34.0)
MCHC: 31.9 g/dL (ref 30.0–36.0)
MCV: 97.8 fL (ref 80.0–100.0)
Monocytes Absolute: 0.4 K/uL (ref 0.1–1.0)
Monocytes Relative: 6 %
Neutro Abs: 5.6 K/uL (ref 1.7–7.7)
Neutrophils Relative %: 83 %
Platelets: 216 K/uL (ref 150–400)
RBC: 3.59 MIL/uL — ABNORMAL LOW (ref 3.87–5.11)
RDW: 14.6 % (ref 11.5–15.5)
WBC: 6.8 K/uL (ref 4.0–10.5)
nRBC: 0 % (ref 0.0–0.2)

## 2023-10-09 LAB — COMPREHENSIVE METABOLIC PANEL WITH GFR
ALT: <5 U/L (ref 0–44)
AST: 18 U/L (ref 15–41)
Albumin: 3.3 g/dL — ABNORMAL LOW (ref 3.5–5.0)
Alkaline Phosphatase: 82 U/L (ref 38–126)
Anion gap: 15 (ref 5–15)
BUN: 78 mg/dL — ABNORMAL HIGH (ref 8–23)
CO2: 21 mmol/L — ABNORMAL LOW (ref 22–32)
Calcium: 9.3 mg/dL (ref 8.9–10.3)
Chloride: 98 mmol/L (ref 98–111)
Creatinine, Ser: 6.86 mg/dL — ABNORMAL HIGH (ref 0.44–1.00)
GFR, Estimated: 6 mL/min — ABNORMAL LOW (ref >60)
Glucose, Bld: 122 mg/dL — ABNORMAL HIGH (ref 70–99)
Potassium: 3.6 mmol/L (ref 3.5–5.1)
Sodium: 134 mmol/L — ABNORMAL LOW (ref 135–145)
Total Bilirubin: 0.5 mg/dL (ref 0.0–1.2)
Total Protein: 6 g/dL — ABNORMAL LOW (ref 6.5–8.1)

## 2023-10-09 NOTE — ED Notes (Signed)
 Pt discharge home EDP aware of hypertension.

## 2023-10-09 NOTE — ED Notes (Signed)
 IV team at bedside

## 2023-10-09 NOTE — Discharge Instructions (Signed)
 You were seen in the emergency department today for oozing of blood around your new catheter site.  While you are here we did a physical exam, labs and change her dressing.  These were all reassuring and showed no evidence of significant bleeding.  Please follow-up with your dialysis as scheduled on Wednesday.  Come back to the emergency department if you have bleeding that will not stop, feel lightheaded, passout or if you have any other reason to believe you need emergency care.

## 2023-10-09 NOTE — ED Provider Notes (Signed)
 [place] EMERGENCY DEPARTMENT AT [facility] Provider Note HPI Karen Myers is a 78 y.o. female with ESRD 2/2 membranous nephropathy, s/p kidney transplant 08/01/21, progressing renal failure with a newly placed port in preparation for dialysis on 9/3 who presents the emergency department due to concern for bleeding from her port site.  Patient states that she had the right sided port placed 8/28.  She has noticed intermittent oozing of blood from around the port site since this time.  The last episode of bleeding was last night where she noticed blood on the gauze dressing.  The bleeding subsided this morning without intervention.  She is here to get her port checked out.  Denies recent infectious symptoms including fevers, chills, shortness of breath, vomiting.  She has generalized weakness that has been unchanged over the last few weeks.   Past Medical History:  Diagnosis Date   Arthritis    Breast cancer (HCC)    left breast cancer    Breast cancer (HCC)    Chronic kidney disease    CIN III (cervical intraepithelial neoplasia III)    prior to LEEP procedure   GERD (gastroesophageal reflux disease)    High cholesterol    Hypertension    Past Surgical History:  Procedure Laterality Date   BREAST LUMPECTOMY     CERVICAL BIOPSY  W/ LOOP ELECTRODE EXCISION  2005   CHOLECYSTECTOMY N/A 03/18/2021   Procedure: LAPAROSCOPIC CHOLECYSTECTOMY WITH ICG;  Surgeon: Teresa Lonni HERO, MD;  Location: [facility] OR;  Service: General;  Laterality: N/A;   COLONOSCOPY     HYSTEROSCOPY WITH D & C  2005   IR TUNNELED CENTRAL VENOUS CATH PLC W IMG  10/05/2023   MASTECTOMY  2002   bilateral    PARATHYROIDECTOMY N/A 06/01/2017   Procedure: PARATHYROIDECTOMY;  Surgeon: Eletha Boas, MD;  Location: [facility] ORS;  Service: General;  Laterality: N/A;   PARATHYROIDECTOMY N/A 12/07/2018   Procedure: NECK EXPLORATION, RIGHT INFERIOR PARATHYROIDECTOMY, LEFT THYROID  LOBECTOMY;  Surgeon: Eletha Boas, MD;   Location: [facility] ORS;  Service: General;  Laterality: N/A;   POLYPECTOMY     RENAL BIOPSY     VULVA /PERINEUM BIOPSY     nevus on vulva     Review of Systems Pertinent positives and negative findings are listed as part of the History of Present Illness and MDM  Physical Exam Vitals:   10/09/23 1545 10/09/23 1630 10/09/23 1855 10/09/23 1900  BP: (!) 148/104 (!) 165/106 (!) 181/131 (!) 180/128  Pulse: 98 87 97 88  Resp: 16 17 16    Temp:      SpO2: 98% 99% 100% 100%  Weight:      Height:         Constitutional Nursing notes reviewed Vital signs reviewed  HEENT No obvious trauma Pupils round, equal, and reactive to light. Pupils cross midline Neck supple  Respiratory Effort normal Breathing well on room air CTAB  CV Normal rate and rhythm  Right-sided hemodialysis catheter in place with dried blood under the sterile dressing.  No evidence of active oozing.  No evidence of purulent discharge, surrounding induration or surrounding cellulitis  Abdomen Soft, non-tender, non-distended No peritonitis  MSK Atraumatic No obvious deformity ROM appropriate  Neuro Awake and alert Pupils cross midline Moving all extremities    MDM:  Initial Differential Diagnoses includes platelet dysfunction, complication from recent right-sided hemodialysis catheter insertion, infection, hematoma, anemia, normal postprocedural bleeding  I reviewed the patient's vitals, the nursing triage note and evaluated the  patient at bedside.   I reviewed the patient's external records which show that the patient had a right-sided tunneled hemodialysis catheter placed on 8/28.  Patient has had intermittent bleeding under her sterile dressing since the procedure.  Bleeding appears to be minimal based on the gauze that she brought in and on my evaluation.  There is no evidence of active bleeding.  In the setting of ESRD, bleeding is likely secondary to poor platelet function.  Labs are reassuring.  Patient's  hemoglobin is 11.2 which is her baseline.  No evidence of thrombocytopenia.  Kidney function at baseline.  Dressing was changed and the wound remains hemostatic.  No evidence of surrounding infection.  Patient denies fevers or infectious symptoms.  Patient will undergo dialysis on Wednesday.  Patient discharged home with return precautions.    Procedures: Procedures  Medications administered in the ED: Medications - No data to display   Impression: 1. Complication of vascular access for dialysis, initial encounter   2. Bleeding      Patient's presentation is most consistent with acute presentation with potential threat to life or bodily function.  Disposition: ED Disposition:  Discharge   Discharge: Patient is felt to be medically appropriate for discharge at this time. Patient was instructed to follow up with their primary care doctor/specialists listed above for re-evaluation. Patient was given strict return precautions.  ED Discharge Orders     None             Dionisio Blunt, MD 10/09/23 ALVIE    Ula Prentice SAUNDERS, MD 10/09/23 [phone]

## 2023-10-09 NOTE — ED Triage Notes (Signed)
 Pt had her dialysis catheter placed in upper right chest on Thursday. She noticed bleeding Friday and it was determined that she needed to apply pressure which resolved. Today pt noticed more bleeding and was told by a provider to be evaluated in hospital.  Pt denies any dizziness at this time. Pt site is covered and blood is clotted around site.   Pt denies any fever or chills

## 2023-10-11 DIAGNOSIS — D689 Coagulation defect, unspecified: Secondary | ICD-10-CM | POA: Diagnosis not present

## 2023-10-11 DIAGNOSIS — N2581 Secondary hyperparathyroidism of renal origin: Secondary | ICD-10-CM | POA: Diagnosis not present

## 2023-10-11 DIAGNOSIS — D631 Anemia in chronic kidney disease: Secondary | ICD-10-CM | POA: Diagnosis not present

## 2023-10-11 DIAGNOSIS — D509 Iron deficiency anemia, unspecified: Secondary | ICD-10-CM | POA: Diagnosis not present

## 2023-10-11 DIAGNOSIS — N186 End stage renal disease: Secondary | ICD-10-CM | POA: Diagnosis not present

## 2023-10-11 DIAGNOSIS — Z992 Dependence on renal dialysis: Secondary | ICD-10-CM | POA: Diagnosis not present

## 2023-10-13 DIAGNOSIS — N186 End stage renal disease: Secondary | ICD-10-CM | POA: Diagnosis not present

## 2023-10-13 DIAGNOSIS — D689 Coagulation defect, unspecified: Secondary | ICD-10-CM | POA: Diagnosis not present

## 2023-10-13 DIAGNOSIS — Z992 Dependence on renal dialysis: Secondary | ICD-10-CM | POA: Diagnosis not present

## 2023-10-13 DIAGNOSIS — N2581 Secondary hyperparathyroidism of renal origin: Secondary | ICD-10-CM | POA: Diagnosis not present

## 2023-10-13 DIAGNOSIS — D631 Anemia in chronic kidney disease: Secondary | ICD-10-CM | POA: Diagnosis not present

## 2023-10-13 DIAGNOSIS — D509 Iron deficiency anemia, unspecified: Secondary | ICD-10-CM | POA: Diagnosis not present

## 2023-10-16 DIAGNOSIS — D509 Iron deficiency anemia, unspecified: Secondary | ICD-10-CM | POA: Diagnosis not present

## 2023-10-16 DIAGNOSIS — N186 End stage renal disease: Secondary | ICD-10-CM | POA: Diagnosis not present

## 2023-10-16 DIAGNOSIS — Z992 Dependence on renal dialysis: Secondary | ICD-10-CM | POA: Diagnosis not present

## 2023-10-16 DIAGNOSIS — N2581 Secondary hyperparathyroidism of renal origin: Secondary | ICD-10-CM | POA: Diagnosis not present

## 2023-10-16 DIAGNOSIS — D689 Coagulation defect, unspecified: Secondary | ICD-10-CM | POA: Diagnosis not present

## 2023-10-16 DIAGNOSIS — D631 Anemia in chronic kidney disease: Secondary | ICD-10-CM | POA: Diagnosis not present

## 2023-10-18 ENCOUNTER — Emergency Department (HOSPITAL_COMMUNITY)
Admission: EM | Admit: 2023-10-18 | Discharge: 2023-10-19 | Disposition: A | Discharge status: Home / Self Care | Attending: Emergency Medicine | Admitting: Emergency Medicine

## 2023-10-18 ENCOUNTER — Other Ambulatory Visit: Payer: Self-pay

## 2023-10-18 DIAGNOSIS — Z79899 Other long term (current) drug therapy: Secondary | ICD-10-CM | POA: Diagnosis not present

## 2023-10-18 DIAGNOSIS — Z992 Dependence on renal dialysis: Secondary | ICD-10-CM | POA: Insufficient documentation

## 2023-10-18 DIAGNOSIS — D689 Coagulation defect, unspecified: Secondary | ICD-10-CM | POA: Diagnosis not present

## 2023-10-18 DIAGNOSIS — Z853 Personal history of malignant neoplasm of breast: Secondary | ICD-10-CM | POA: Diagnosis not present

## 2023-10-18 DIAGNOSIS — D631 Anemia in chronic kidney disease: Secondary | ICD-10-CM | POA: Diagnosis not present

## 2023-10-18 DIAGNOSIS — Z7982 Long term (current) use of aspirin: Secondary | ICD-10-CM | POA: Diagnosis not present

## 2023-10-18 DIAGNOSIS — I120 Hypertensive chronic kidney disease with stage 5 chronic kidney disease or end stage renal disease: Secondary | ICD-10-CM | POA: Insufficient documentation

## 2023-10-18 DIAGNOSIS — R7989 Other specified abnormal findings of blood chemistry: Secondary | ICD-10-CM | POA: Insufficient documentation

## 2023-10-18 DIAGNOSIS — D649 Anemia, unspecified: Secondary | ICD-10-CM | POA: Insufficient documentation

## 2023-10-18 DIAGNOSIS — D509 Iron deficiency anemia, unspecified: Secondary | ICD-10-CM | POA: Diagnosis not present

## 2023-10-18 DIAGNOSIS — N186 End stage renal disease: Secondary | ICD-10-CM | POA: Insufficient documentation

## 2023-10-18 DIAGNOSIS — G4489 Other headache syndrome: Secondary | ICD-10-CM | POA: Diagnosis not present

## 2023-10-18 DIAGNOSIS — R519 Headache, unspecified: Secondary | ICD-10-CM | POA: Diagnosis present

## 2023-10-18 DIAGNOSIS — I10 Essential (primary) hypertension: Secondary | ICD-10-CM

## 2023-10-18 DIAGNOSIS — N2581 Secondary hyperparathyroidism of renal origin: Secondary | ICD-10-CM | POA: Diagnosis not present

## 2023-10-18 LAB — CBC
HCT: 33.1 % — ABNORMAL LOW (ref 36.0–46.0)
Hemoglobin: 10.4 g/dL — ABNORMAL LOW (ref 12.0–15.0)
MCH: 30.8 pg (ref 26.0–34.0)
MCHC: 31.4 g/dL (ref 30.0–36.0)
MCV: 97.9 fL (ref 80.0–100.0)
Platelets: 246 K/uL (ref 150–400)
RBC: 3.38 MIL/uL — ABNORMAL LOW (ref 3.87–5.11)
RDW: 14.3 % (ref 11.5–15.5)
WBC: 6.6 K/uL (ref 4.0–10.5)
nRBC: 0 % (ref 0.0–0.2)

## 2023-10-18 LAB — TROPONIN I (HIGH SENSITIVITY): Troponin I (High Sensitivity): 78 ng/L — ABNORMAL HIGH (ref <18)

## 2023-10-18 LAB — URINALYSIS, ROUTINE W REFLEX MICROSCOPIC
Bacteria, UA: NONE SEEN
Bilirubin Urine: NEGATIVE
Glucose, UA: 50 mg/dL — AB
Ketones, ur: NEGATIVE mg/dL
Leukocytes,Ua: NEGATIVE
Nitrite: NEGATIVE
Protein, ur: 100 mg/dL — AB
Specific Gravity, Urine: 1.005 (ref 1.005–1.030)
pH: 8 (ref 5.0–8.0)

## 2023-10-18 LAB — I-STAT CHEM 8, ED
BUN: 9 mg/dL (ref 8–23)
Calcium, Ion: 1.15 mmol/L (ref 1.15–1.40)
Chloride: 98 mmol/L (ref 98–111)
Creatinine, Ser: 2.6 mg/dL — ABNORMAL HIGH (ref 0.44–1.00)
Glucose, Bld: 99 mg/dL (ref 70–99)
HCT: 32 % — ABNORMAL LOW (ref 36.0–46.0)
Hemoglobin: 10.9 g/dL — ABNORMAL LOW (ref 12.0–15.0)
Potassium: 3.7 mmol/L (ref 3.5–5.1)
Sodium: 133 mmol/L — ABNORMAL LOW (ref 135–145)
TCO2: 23 mmol/L (ref 22–32)

## 2023-10-18 LAB — COMPREHENSIVE METABOLIC PANEL WITH GFR
ALT: 8 U/L (ref 0–44)
AST: 20 U/L (ref 15–41)
Albumin: 3.1 g/dL — ABNORMAL LOW (ref 3.5–5.0)
Alkaline Phosphatase: 92 U/L (ref 38–126)
Anion gap: 12 (ref 5–15)
BUN: 9 mg/dL (ref 8–23)
CO2: 23 mmol/L (ref 22–32)
Calcium: 8.8 mg/dL — ABNORMAL LOW (ref 8.9–10.3)
Chloride: 98 mmol/L (ref 98–111)
Creatinine, Ser: 2.38 mg/dL — ABNORMAL HIGH (ref 0.44–1.00)
GFR, Estimated: 21 mL/min — ABNORMAL LOW (ref >60)
Glucose, Bld: 101 mg/dL — ABNORMAL HIGH (ref 70–99)
Potassium: 3.7 mmol/L (ref 3.5–5.1)
Sodium: 133 mmol/L — ABNORMAL LOW (ref 135–145)
Total Bilirubin: 0.8 mg/dL (ref 0.0–1.2)
Total Protein: 5.5 g/dL — ABNORMAL LOW (ref 6.5–8.1)

## 2023-10-18 LAB — LIPASE, BLOOD: Lipase: 42 U/L (ref 11–51)

## 2023-10-18 MED ORDER — OXYCODONE-ACETAMINOPHEN 5-325 MG PO TABS
ORAL_TABLET | ORAL | Status: AC
  Filled 2023-10-18: qty 1

## 2023-10-18 MED ORDER — OXYCODONE-ACETAMINOPHEN 5-325 MG PO TABS
1.0000 | ORAL_TABLET | Freq: Once | ORAL | Status: AC
  Administered 2023-10-18: 1 via ORAL

## 2023-10-18 NOTE — ED Notes (Signed)
 The pt is a kidney transplant patient that was just dialyzed for the first time in the past week  she has a dialysis catheter in her rt upper  chest

## 2023-10-18 NOTE — ED Triage Notes (Signed)
 Pt in with HA and HTN, states she has had both since finishing her dialysis trx around 1545 this afternoon.

## 2023-10-19 DIAGNOSIS — G4489 Other headache syndrome: Secondary | ICD-10-CM | POA: Diagnosis not present

## 2023-10-19 LAB — TROPONIN I (HIGH SENSITIVITY): Troponin I (High Sensitivity): 70 ng/L — ABNORMAL HIGH (ref <18)

## 2023-10-19 NOTE — Discharge Instructions (Signed)
 As we discussed, ask your doctor on Friday if you can increase your amlodipine to 10 mg a day  You are having a headache. No specific cause was found today for your headache. It may have been a migraine or other cause of headache. Stress, anxiety, fatigue, and depression are common triggers for headaches. Your headache today does not appear to be life-threatening or require hospitalization, but often the exact cause of headaches is not determined in the emergency department. Therefore, follow-up with your doctor is very important to find out what may have caused your headache, and whether or not you need any further diagnostic testing or treatment. Sometimes headaches can appear benign (not harmful), but then more serious symptoms can develop which should prompt an immediate re-evaluation by your doctor or the emergency department.  SEEK MEDICAL ATTENTION IF:  You develop possible problems with medications prescribed.  The medications don't resolve your headache, if it recurs , or if you have multiple episodes of vomiting or can't take fluids. You have a change from the usual headache.  RETURN IMMEDIATELY IF you develop a sudden, severe headache or confusion, become poorly responsive or faint, develop a fever above 100.34F or problem breathing, have a change in speech, vision, swallowing, or understanding, or develop new weakness, numbness, tingling, incoordination, or have a seizure.

## 2023-10-19 NOTE — ED Provider Notes (Signed)
 [place] EMERGENCY DEPARTMENT AT [facility] Provider Note   CSN: [id number] Arrival date & time: 10/18/23  2211     Patient presents with: Headache and Hypertension   Karen Myers is a 78 y.o. female.   The history is provided by the patient.  Patient with history of chronic kidney disease, previous renal transplant now just started dialysis presents with headache and hypertension Patient reports she had her first dialysis session last week. Patient goes Monday Wednesday Friday. After her dialysis session yesterday, when she was walking to the car she had gradual onset of generalized headache.  She had no other associated symptoms-she denies fever, vomiting, arm or leg weakness.  She is ambulatory.  No new visual changes. She checked her blood pressure at home and it was significantly elevated She takes amlodipine 5 mg daily and was instructed to take it after her dialysis sessions  She is now feeling improved and would like to go home    Past Medical History:  Diagnosis Date   Arthritis    Breast cancer (HCC)    left breast cancer    Breast cancer (HCC)    Chronic kidney disease    CIN III (cervical intraepithelial neoplasia III)    prior to LEEP procedure   GERD (gastroesophageal reflux disease)    High cholesterol    Hypertension     Prior to Admission medications   Medication Sig Start Date End Date Taking? Authorizing Provider  acetaminophen  (TYLENOL ) 500 MG tablet Take 1,000 mg by mouth 2 (two) times daily.    [provider]  amLODipine (NORVASC) 5 MG tablet Take 5 mg by mouth daily as needed. 08/30/21   [provider]  aspirin  EC 81 MG tablet Take 81 mg by mouth daily.    [provider]  atorvastatin (LIPITOR) 40 MG tablet Take 40 mg by mouth daily.    [provider]  DILT-XR 240 MG 24 hr capsule Take 240 mg by mouth daily. 12/09/20   [provider]  fluticasone  (FLONASE ) 50 MCG/ACT nasal spray  Place 1 spray into both nostrils daily. 04/26/23   Chandra Harlene LABOR, NP  JANUVIA 25 MG tablet Take 25 mg by mouth daily.    [provider]  pantoprazole  (PROTONIX ) 40 MG tablet Take 40 mg by mouth daily. 08/31/21   [provider]  polyethylene glycol (MIRALAX / GLYCOLAX) 17 g packet Take 17 g by mouth as needed. 08/04/21   [provider]  predniSONE  (DELTASONE ) 5 MG tablet Take 10 mg by mouth 2 (two) times daily. 08/31/21   [provider]  senna-docusate (SENOKOT-S) 8.6-50 MG tablet Take 1 tablet by mouth as needed. 08/04/21   [provider]  tacrolimus  (PROGRAF ) 1 MG capsule Take 1 mg by mouth in the morning and at bedtime. 08/23/21   [provider]  losartan  (COZAAR ) 100 MG tablet Take 100 mg by mouth daily.  03/07/20  [provider]    Allergies: Altace [ramipril], Lisinopril, and Losartan     Review of Systems  Constitutional:  Negative for fever.  Eyes:  Negative for visual disturbance.  Respiratory:  Negative for shortness of breath.   Cardiovascular:  Negative for chest pain.  Neurological:  Positive for headaches. Negative for weakness and numbness.    Updated Vital Signs BP (!) 183/115 (BP Location: Left Arm)   Pulse 88   Temp 97.8 F (36.6 C) (Oral)   Resp 16   Ht 1.702 m (5' 7)  Wt 57 kg   SpO2 100%   BMI 19.68 kg/m   Physical Exam CONSTITUTIONAL: Well developed/well nourished HEAD: Normocephalic/atraumatic EYES: EOMI/PERRL, no nystagmus, no ptosis, no visual field deficit ENMT: Mucous membranes moist NECK: supple no meningeal signs SPINE/BACK:entire spine nontender CV: S1/S2 noted, no murmurs/rubs/gallops noted LUNGS: Lungs are clear to auscultation bilaterally, no apparent distress ABDOMEN: soft, nontender, no rebound or guarding GU:no cva tenderness NEURO:Awake/alert, face symmetric, no arm or leg drift is noted Equal 5/5 strength with shoulder abduction, elbow flex/extension, wrist  flex/extension in upper extremities and equal hand grips bilaterally Equal 5/5 strength with hip flexion,knee flex/extension, foot dorsi/plantar flexion Cranial nerves 3/4/5/6/08/15/08/11/12 tested and intact Gait normal without ataxia No past pointing Sensation to light touch intact in all extremities EXTREMITIES: pulses normal, full ROM SKIN: warm, color normal PSYCH: no abnormalities of mood noted, alert and oriented to situation  (all labs ordered are listed, but only abnormal results are displayed) Labs Reviewed  COMPREHENSIVE METABOLIC PANEL WITH GFR - Abnormal; Notable for the following components:      Result Value   Sodium 133 (*)    Glucose, Bld 101 (*)    Creatinine, Ser 2.38 (*)    Calcium 8.8 (*)    Total Protein 5.5 (*)    Albumin 3.1 (*)    GFR, Estimated 21 (*)    All other components within normal limits  CBC - Abnormal; Notable for the following components:   RBC 3.38 (*)    Hemoglobin 10.4 (*)    HCT 33.1 (*)    All other components within normal limits  URINALYSIS, ROUTINE W REFLEX MICROSCOPIC - Abnormal; Notable for the following components:   Color, Urine STRAW (*)    Glucose, UA 50 (*)    Hgb urine dipstick SMALL (*)    Protein, ur 100 (*)    All other components within normal limits  I-STAT CHEM 8, ED - Abnormal; Notable for the following components:   Sodium 133 (*)    Creatinine, Ser 2.60 (*)    Hemoglobin 10.9 (*)    HCT 32.0 (*)    All other components within normal limits  TROPONIN I (HIGH SENSITIVITY) - Abnormal; Notable for the following components:   Troponin I (High Sensitivity) 78 (*)    All other components within normal limits  TROPONIN I (HIGH SENSITIVITY) - Abnormal; Notable for the following components:   Troponin I (High Sensitivity) 70 (*)    All other components within normal limits  LIPASE, BLOOD    EKG: EKG Interpretation Date/Time:  Thursday October 19 2023 01:08:58 EDT Ventricular Rate:  89 PR Interval:  155 QRS  Duration:  109 QT Interval:  419 QTC Calculation: 510 R Axis:   -33  Text Interpretation: Sinus rhythm Consider right atrial enlargement Abnormal R-wave progression, early transition LVH with secondary repolarization abnormality Confirmed by Midge Golas ([id number]) on 10/19/2023 1:11:49 AM  Radiology: No results found.   Procedures   Medications Ordered in the ED  oxyCODONE -acetaminophen  (PERCOCET/ROXICET) 5-325 MG per tablet 1 tablet (1 tablet Oral Given 10/18/23 2242)    Clinical Course as of 10/19/23 0155  Thu Oct 19, 2023  0152 Hemoglobin(!): 10.4 Anemia [DW]  0152 Creatinine(!): 2.38 Renal failure [DW]  0152 Troponin I (High Sensitivity)(!): 70 Chronically elevated troponin [DW]    Clinical Course User Index [DW] Midge Golas, MD  Medical Decision Making Amount and/or Complexity of Data Reviewed Labs:  Decision-making details documented in ED Course.   This patient presents to the ED for concern of headache, this involves an extensive number of treatment options, and is a complaint that carries with it a high risk of complications and morbidity.  The differential diagnosis includes but is not limited to subdural hematoma, subarachnoid hemorrhage, skull fracture, concussion   Comorbidities that complicate the patient evaluation: Patient's presentation is complicated by their history of end-stage renal disease  Social Determinants of Health: Patient's sole caregiver of her husband who is 53 and has dementia,  increases the complexity of managing their presentation  Additional history obtained: Records reviewed Care Everywhere/External Records  Lab Tests: I Ordered, and personally interpreted labs.  The pertinent results include: Renal failure, chronically elevated troponin, anemia  Medicines ordered and prescription drug management: I ordered medication including Percocet for pain Reevaluation of the patient after these  medicines showed that the patient    improved  Test Considered: I considered neuroimaging, but since patient is back to baseline and requesting discharge will defer imaging  Reevaluation: After the interventions noted above, I reevaluated the patient and found that they have :improved  Complexity of problems addressed: Patient's presentation is most consistent with  acute presentation with potential threat to life or bodily function  Disposition: After consideration of the diagnostic results and the patient's response to treatment,  I feel that the patent would benefit from discharge  .    Patient presents with headache & hypertension that are both improving. Patient just started dialysis and is only on amlodipine 5 mg daily She would likely need to have this adjusted.  She will discuss this with her nephrologist later this week Patient is awake alert, smiling in no distress walking around the room Patient safe to discharge    Final diagnoses:  Primary hypertension  Other headache syndrome    ED Discharge Orders     None          Midge Golas, MD 10/19/23 0201

## 2023-10-20 DIAGNOSIS — D689 Coagulation defect, unspecified: Secondary | ICD-10-CM | POA: Diagnosis not present

## 2023-10-20 DIAGNOSIS — N2581 Secondary hyperparathyroidism of renal origin: Secondary | ICD-10-CM | POA: Diagnosis not present

## 2023-10-20 DIAGNOSIS — D631 Anemia in chronic kidney disease: Secondary | ICD-10-CM | POA: Diagnosis not present

## 2023-10-20 DIAGNOSIS — N186 End stage renal disease: Secondary | ICD-10-CM | POA: Diagnosis not present

## 2023-10-20 DIAGNOSIS — Z992 Dependence on renal dialysis: Secondary | ICD-10-CM | POA: Diagnosis not present

## 2023-10-20 DIAGNOSIS — D509 Iron deficiency anemia, unspecified: Secondary | ICD-10-CM | POA: Diagnosis not present

## 2023-10-23 DIAGNOSIS — D509 Iron deficiency anemia, unspecified: Secondary | ICD-10-CM | POA: Diagnosis not present

## 2023-10-23 DIAGNOSIS — D689 Coagulation defect, unspecified: Secondary | ICD-10-CM | POA: Diagnosis not present

## 2023-10-23 DIAGNOSIS — N2581 Secondary hyperparathyroidism of renal origin: Secondary | ICD-10-CM | POA: Diagnosis not present

## 2023-10-23 DIAGNOSIS — D631 Anemia in chronic kidney disease: Secondary | ICD-10-CM | POA: Diagnosis not present

## 2023-10-23 DIAGNOSIS — N186 End stage renal disease: Secondary | ICD-10-CM | POA: Diagnosis not present

## 2023-10-23 DIAGNOSIS — Z992 Dependence on renal dialysis: Secondary | ICD-10-CM | POA: Diagnosis not present

## 2023-10-24 DIAGNOSIS — I10 Essential (primary) hypertension: Secondary | ICD-10-CM | POA: Diagnosis not present

## 2023-10-24 DIAGNOSIS — N186 End stage renal disease: Secondary | ICD-10-CM | POA: Diagnosis not present

## 2023-10-25 DIAGNOSIS — N186 End stage renal disease: Secondary | ICD-10-CM | POA: Diagnosis not present

## 2023-10-25 DIAGNOSIS — Z992 Dependence on renal dialysis: Secondary | ICD-10-CM | POA: Diagnosis not present

## 2023-10-25 DIAGNOSIS — N2581 Secondary hyperparathyroidism of renal origin: Secondary | ICD-10-CM | POA: Diagnosis not present

## 2023-10-25 DIAGNOSIS — D509 Iron deficiency anemia, unspecified: Secondary | ICD-10-CM | POA: Diagnosis not present

## 2023-10-25 DIAGNOSIS — D689 Coagulation defect, unspecified: Secondary | ICD-10-CM | POA: Diagnosis not present

## 2023-10-25 DIAGNOSIS — D631 Anemia in chronic kidney disease: Secondary | ICD-10-CM | POA: Diagnosis not present

## 2023-10-27 DIAGNOSIS — D631 Anemia in chronic kidney disease: Secondary | ICD-10-CM | POA: Diagnosis not present

## 2023-10-27 DIAGNOSIS — N186 End stage renal disease: Secondary | ICD-10-CM | POA: Diagnosis not present

## 2023-10-27 DIAGNOSIS — Z992 Dependence on renal dialysis: Secondary | ICD-10-CM | POA: Diagnosis not present

## 2023-10-27 DIAGNOSIS — D689 Coagulation defect, unspecified: Secondary | ICD-10-CM | POA: Diagnosis not present

## 2023-10-27 DIAGNOSIS — N2581 Secondary hyperparathyroidism of renal origin: Secondary | ICD-10-CM | POA: Diagnosis not present

## 2023-10-27 DIAGNOSIS — D509 Iron deficiency anemia, unspecified: Secondary | ICD-10-CM | POA: Diagnosis not present

## 2023-10-30 DIAGNOSIS — N186 End stage renal disease: Secondary | ICD-10-CM | POA: Diagnosis not present

## 2023-10-30 DIAGNOSIS — Z992 Dependence on renal dialysis: Secondary | ICD-10-CM | POA: Diagnosis not present

## 2023-10-30 DIAGNOSIS — D689 Coagulation defect, unspecified: Secondary | ICD-10-CM | POA: Diagnosis not present

## 2023-10-30 DIAGNOSIS — D509 Iron deficiency anemia, unspecified: Secondary | ICD-10-CM | POA: Diagnosis not present

## 2023-10-30 DIAGNOSIS — N2581 Secondary hyperparathyroidism of renal origin: Secondary | ICD-10-CM | POA: Diagnosis not present

## 2023-10-30 DIAGNOSIS — D631 Anemia in chronic kidney disease: Secondary | ICD-10-CM | POA: Diagnosis not present

## 2023-11-01 DIAGNOSIS — N186 End stage renal disease: Secondary | ICD-10-CM | POA: Diagnosis not present

## 2023-11-01 DIAGNOSIS — Z992 Dependence on renal dialysis: Secondary | ICD-10-CM | POA: Diagnosis not present

## 2023-11-01 DIAGNOSIS — D689 Coagulation defect, unspecified: Secondary | ICD-10-CM | POA: Diagnosis not present

## 2023-11-01 DIAGNOSIS — D631 Anemia in chronic kidney disease: Secondary | ICD-10-CM | POA: Diagnosis not present

## 2023-11-01 DIAGNOSIS — D509 Iron deficiency anemia, unspecified: Secondary | ICD-10-CM | POA: Diagnosis not present

## 2023-11-01 DIAGNOSIS — N2581 Secondary hyperparathyroidism of renal origin: Secondary | ICD-10-CM | POA: Diagnosis not present

## 2023-11-02 ENCOUNTER — Ambulatory Visit (HOSPITAL_COMMUNITY)
Admission: RE | Admit: 2023-11-02 | Discharge: 2023-11-02 | Disposition: A | Discharge status: Home / Self Care | Source: Ambulatory Visit | Attending: Vascular Surgery | Admitting: Vascular Surgery

## 2023-11-02 ENCOUNTER — Ambulatory Visit: Admitting: Vascular Surgery

## 2023-11-02 ENCOUNTER — Ambulatory Visit (HOSPITAL_BASED_OUTPATIENT_CLINIC_OR_DEPARTMENT_OTHER)
Admission: RE | Admit: 2023-11-02 | Discharge: 2023-11-02 | Disposition: A | Discharge status: Home / Self Care | Source: Ambulatory Visit | Attending: Vascular Surgery

## 2023-11-02 ENCOUNTER — Encounter: Payer: Self-pay | Admitting: Vascular Surgery

## 2023-11-02 VITALS — BP 159/84 | HR 85 | Temp 97.8°F | Resp 18 | Ht 67.0 in | Wt 116.9 lb

## 2023-11-02 DIAGNOSIS — N186 End stage renal disease: Secondary | ICD-10-CM | POA: Insufficient documentation

## 2023-11-02 NOTE — Progress Notes (Signed)
 Office Note     CC:  Chronic Kidney Disease Requesting Provider:  Rayburn Pac, MD  HPI: Karen Myers is a Right handed 78 y.o. (21-Jan-1946) female with kidney disease who presents at the request of Rayburn Pac, MD for permanent HD access. The patient has had no prior access procedures. Currently CKD5 based on GFR.  She was last seen in 2023 with plans for left brachiobasilic fistula creation, however received a kidney.  Unfortunately, this kidney only worked for 2 years.  Karen Myers is currently dialyzed through a right sided tunneled IJ catheter.  On exam, Karen Myers was doing well.  She had no complaints.  We discussed the role of hemodialysis as well as the steps of fistula creation and maturation.  She stated she is no longer a candidate for transplant due to issues with antirejection medications.  The pt is  on a statin for cholesterol management.  The pt is  on a daily aspirin .   Other AC:  - The pt is  on medications for hypertension.   The pt is not diabetic. Tobacco hx:  -  Past Medical History:  Diagnosis Date   Arthritis    Breast cancer (HCC)    left breast cancer    Breast cancer (HCC)    Chronic kidney disease    CIN III (cervical intraepithelial neoplasia III)    prior to LEEP procedure   GERD (gastroesophageal reflux disease)    High cholesterol    Hypertension     Past Surgical History:  Procedure Laterality Date   BREAST LUMPECTOMY     CERVICAL BIOPSY  W/ LOOP ELECTRODE EXCISION  2005   CHOLECYSTECTOMY N/A 03/18/2021   Procedure: LAPAROSCOPIC CHOLECYSTECTOMY WITH ICG;  Surgeon: Teresa Lonni HERO, MD;  Location: [facility] OR;  Service: General;  Laterality: N/A;   COLONOSCOPY     HYSTEROSCOPY WITH D & C  2005   IR TUNNELED CENTRAL VENOUS CATH PLC W IMG  10/05/2023   MASTECTOMY  2002   bilateral    PARATHYROIDECTOMY N/A 06/01/2017   Procedure: PARATHYROIDECTOMY;  Surgeon: Eletha Boas, MD;  Location: [facility] ORS;  Service: General;  Laterality:  N/A;   PARATHYROIDECTOMY N/A 12/07/2018   Procedure: NECK EXPLORATION, RIGHT INFERIOR PARATHYROIDECTOMY, LEFT THYROID  LOBECTOMY;  Surgeon: Eletha Boas, MD;  Location: [facility] ORS;  Service: General;  Laterality: N/A;   POLYPECTOMY     RENAL BIOPSY     VULVA MARYBETH BIOPSY     nevus on vulva     Social History   Socioeconomic History   Marital status: Married    Spouse name: Not on file   Number of children: Not on file   Years of education: Not on file   Highest education level: Not on file  Occupational History   Not on file  Tobacco Use   Smoking status: Former   Smokeless tobacco: Never  Vaping Use   Vaping status: Never Used  Substance and Sexual Activity   Alcohol use: No   Drug use: No   Sexual activity: Yes  Other Topics Concern   Not on file  Social History Narrative   Not on file   Social Drivers of Health   Financial Resource Strain: Not on file  Food Insecurity: Low Risk  (09/27/2023)   Received from [facility]   Hunger Vital Sign    Within the past 12 months, you worried that your food would run out before you got money to buy more: Never true    Within  the past 12 months, the food you bought just didn't last and you didn't have money to get more. : Never true  Transportation Needs: No Transportation Needs (09/27/2023)   Received from [facility]    In the past 12 months, has lack of reliable transportation kept you from medical appointments, meetings, work or from getting things needed for daily living? : No  Physical Activity: Not on file  Stress: Not on file  Social Connections: Not on file  Intimate Partner Violence: Not on file   No family history on file.  Current Outpatient Medications  Medication Sig Dispense Refill   acetaminophen  (TYLENOL ) 500 MG tablet Take 1,000 mg by mouth 2 (two) times daily.     amLODipine (NORVASC) 5 MG tablet Take 5 mg by mouth daily as needed.     aspirin  EC 81 MG tablet Take 81 mg by mouth daily.      atorvastatin (LIPITOR) 40 MG tablet Take 40 mg by mouth daily.     DILT-XR 240 MG 24 hr capsule Take 240 mg by mouth daily.     fluticasone  (FLONASE ) 50 MCG/ACT nasal spray Place 1 spray into both nostrils daily. 16 g 0   JANUVIA 25 MG tablet Take 25 mg by mouth daily.     pantoprazole  (PROTONIX ) 40 MG tablet Take 40 mg by mouth daily.     polyethylene glycol (MIRALAX / GLYCOLAX) 17 g packet Take 17 g by mouth as needed.     predniSONE  (DELTASONE ) 5 MG tablet Take 10 mg by mouth 2 (two) times daily.     senna-docusate (SENOKOT-S) 8.6-50 MG tablet Take 1 tablet by mouth as needed.     tacrolimus  (PROGRAF ) 1 MG capsule Take 1 mg by mouth in the morning and at bedtime.     No current facility-administered medications for this visit.    Allergies  Allergen Reactions   Altace [Ramipril] Swelling    Swelling of lips, hypotension   Lisinopril     Other reaction(s): angioedema   Losartan  Other (See Comments)     REVIEW OF SYSTEMS:   [X]  denotes positive finding, [ ]  denotes negative finding Cardiac  Comments:  Chest pain or chest pressure:    Shortness of breath upon exertion:    Short of breath when lying flat:    Irregular heart rhythm:        Vascular    Pain in calf, thigh, or hip brought on by ambulation:    Pain in feet at night that wakes you up from your sleep:     Blood clot in your veins:    Leg swelling:         Pulmonary    Oxygen at home:    Productive cough:     Wheezing:         Neurologic    Sudden weakness in arms or legs:     Sudden numbness in arms or legs:     Sudden onset of difficulty speaking or slurred speech:    Temporary loss of vision in one eye:     Problems with dizziness:         Gastrointestinal    Blood in stool:     Vomited blood:         Genitourinary    Burning when urinating:     Blood in urine:        Psychiatric    Major depression:  Hematologic    Bleeding problems:    Problems with blood clotting too easily:         Skin    Rashes or ulcers:        Constitutional    Fever or chills:      PHYSICAL EXAMINATION:  There were no vitals filed for this visit.  General:  WDWN in NAD; vital signs documented above Gait: Not observed HENT: WNL, normocephalic Pulmonary: normal non-labored breathing , without Rales, rhonchi,  wheezing Cardiac: regular HR,  Abdomen: soft, NT, no masses Skin: without rashes Vascular Exam/Pulses:  Right Left  Radial 2+ (normal) 2+ (normal)  Ulnar 2+ (normal) 2+ (normal)                   Extremities: without ischemic changes, without Gangrene , without cellulitis; without open wounds;  Musculoskeletal: no muscle wasting or atrophy  Neurologic: A&O X 3;  No focal weakness or paresthesias are detected Psychiatric:  The pt has Normal affect.  +-----------------+-------------+----------+--------------+  Right Cephalic   Diameter (cm)Depth (cm)   Findings     +-----------------+-------------+----------+--------------+  Shoulder                               not visualized  +-----------------+-------------+----------+--------------+  Prox upper arm       0.04                               +-----------------+-------------+----------+--------------+  Mid upper arm        0.04                               +-----------------+-------------+----------+--------------+  Dist upper arm       0.12                               +-----------------+-------------+----------+--------------+  Antecubital fossa    0.06                               +-----------------+-------------+----------+--------------+  Prox forearm         0.10                               +-----------------+-------------+----------+--------------+  Mid forearm          0.30                               +-----------------+-------------+----------+--------------+  Dist forearm         0.19                                +-----------------+-------------+----------+--------------+   +-----------------+-------------+----------+---------+  Right Basilic    Diameter (cm)Depth (cm)Findings   +-----------------+-------------+----------+---------+  Prox upper arm       0.72                          +-----------------+-------------+----------+---------+  Mid upper arm        0.60                          +-----------------+-------------+----------+---------+  Dist upper arm       0.62                          +-----------------+-------------+----------+---------+  Antecubital fossa    0.42                          +-----------------+-------------+----------+---------+  Prox forearm         0.18               branching  +-----------------+-------------+----------+---------+  Mid forearm          0.11               branching  +-----------------+-------------+----------+---------+  Distal forearm       0.09               branching  +-----------------+-------------+----------+---------+   +-----------------+-------------+----------+--------------+  Left Cephalic    Diameter (cm)Depth (cm)   Findings     +-----------------+-------------+----------+--------------+  Shoulder                               not visualized  +-----------------+-------------+----------+--------------+  Prox upper arm       0.07                               +-----------------+-------------+----------+--------------+  Mid upper arm        0.06                               +-----------------+-------------+----------+--------------+  Dist upper arm       0.08                               +-----------------+-------------+----------+--------------+  Antecubital fossa    0.06                               +-----------------+-------------+----------+--------------+  Prox forearm         0.05                                +-----------------+-------------+----------+--------------+  Mid forearm          0.15                               +-----------------+-------------+----------+--------------+  Dist forearm         0.10                               +-----------------+-------------+----------+--------------+   +-----------------+-------------+----------+--------+  Left Basilic     Diameter (cm)Depth (cm)Findings  +-----------------+-------------+----------+--------+  Prox upper arm       0.58                         +-----------------+-------------+----------+--------+  Mid upper arm        0.44                         +-----------------+-------------+----------+--------+  Dist upper arm  0.35                         +-----------------+-------------+----------+--------+  Antecubital fossa    0.27                         +-----------------+-------------+----------+--------+  Prox forearm         0.17                         +-----------------+-------------+----------+--------+  Mid forearm          0.10                         +-----------------+-------------+----------+--------+  Distal forearm       0.08                         +-----------------+-------------+----------+--------+   ASSESSMENT/PLAN:  Karen Myers is a 78 y.o. female who presents with chronic kidney disease stage 5  Based on vein mapping and examination, patient has suitable vein for brachiobasilic fistula bilaterally Karen Myers would be best served with staged, left-sided brachiobasilic fistula. I had an extensive discussion with this patient in regards to the nature of access surgery, including risk, benefits, and alternatives.   The patient is aware that the risks of access surgery include but are not limited to: bleeding, infection, steal syndrome, nerve damage, ischemic monomelic neuropathy, failure of access to mature, complications related to venous hypertension,  and possible need for additional access procedures in the future. I discussed with the patient the nature of the staged access procedure, specifically the need for a second operation to transpose the first stage fistula.  After discussing the risks and benefits, Karen Myers elected to proceed.  This will be scheduled at her convenience.  [name], MD [facility] [phone]

## 2023-11-03 DIAGNOSIS — D689 Coagulation defect, unspecified: Secondary | ICD-10-CM | POA: Diagnosis not present

## 2023-11-03 DIAGNOSIS — N2581 Secondary hyperparathyroidism of renal origin: Secondary | ICD-10-CM | POA: Diagnosis not present

## 2023-11-03 DIAGNOSIS — D631 Anemia in chronic kidney disease: Secondary | ICD-10-CM | POA: Diagnosis not present

## 2023-11-03 DIAGNOSIS — N186 End stage renal disease: Secondary | ICD-10-CM | POA: Diagnosis not present

## 2023-11-03 DIAGNOSIS — Z992 Dependence on renal dialysis: Secondary | ICD-10-CM | POA: Diagnosis not present

## 2023-11-03 DIAGNOSIS — D509 Iron deficiency anemia, unspecified: Secondary | ICD-10-CM | POA: Diagnosis not present

## 2023-11-06 ENCOUNTER — Other Ambulatory Visit: Payer: Self-pay

## 2023-11-06 DIAGNOSIS — D509 Iron deficiency anemia, unspecified: Secondary | ICD-10-CM | POA: Diagnosis not present

## 2023-11-06 DIAGNOSIS — N186 End stage renal disease: Secondary | ICD-10-CM | POA: Diagnosis not present

## 2023-11-06 DIAGNOSIS — N2581 Secondary hyperparathyroidism of renal origin: Secondary | ICD-10-CM | POA: Diagnosis not present

## 2023-11-06 DIAGNOSIS — D689 Coagulation defect, unspecified: Secondary | ICD-10-CM | POA: Diagnosis not present

## 2023-11-06 DIAGNOSIS — Z992 Dependence on renal dialysis: Secondary | ICD-10-CM | POA: Diagnosis not present

## 2023-11-06 DIAGNOSIS — D631 Anemia in chronic kidney disease: Secondary | ICD-10-CM | POA: Diagnosis not present

## 2023-11-07 DIAGNOSIS — N185 Chronic kidney disease, stage 5: Secondary | ICD-10-CM | POA: Diagnosis not present

## 2023-11-07 DIAGNOSIS — E7800 Pure hypercholesterolemia, unspecified: Secondary | ICD-10-CM | POA: Diagnosis not present

## 2023-11-07 DIAGNOSIS — Z853 Personal history of malignant neoplasm of breast: Secondary | ICD-10-CM | POA: Diagnosis not present

## 2023-11-07 DIAGNOSIS — N189 Chronic kidney disease, unspecified: Secondary | ICD-10-CM | POA: Diagnosis not present

## 2023-11-07 DIAGNOSIS — I120 Hypertensive chronic kidney disease with stage 5 chronic kidney disease or end stage renal disease: Secondary | ICD-10-CM | POA: Diagnosis not present

## 2023-11-13 ENCOUNTER — Encounter (HOSPITAL_COMMUNITY): Payer: Self-pay | Admitting: Vascular Surgery

## 2023-11-13 NOTE — Anesthesia Preprocedure Evaluation (Addendum)
 Anesthesia Evaluation  Patient identified by MRN, date of birth, ID band Patient awake    Reviewed: Allergy & Precautions, NPO status , Patient's Chart, lab work & pertinent test results  History of Anesthesia Complications Negative for: history of anesthetic complications  Airway Mallampati: III  TM Distance: >3 FB Neck ROM: Full   Comment: Previous grade I view with MAC 3 Dental  (+) Dental Advisory Given, Missing   Pulmonary neg shortness of breath, neg sleep apnea, neg COPD, neg recent URI, former smoker   Pulmonary exam normal breath sounds clear to auscultation       Cardiovascular hypertension (amlodipine), Pt. on medications (-) angina (-) Past MI, (-) Cardiac Stents and (-) CABG + dysrhythmias (iRBBB)  Rhythm:Regular Rate:Normal  HLD  TTE 12/04/2020 (Care Everywhere): SUMMARY  The left ventricular size is normal.  There is moderate concentric left ventricular hypertrophy.  Left ventricular systolic function is normal.  LV ejection fraction = 55-60%.  Left ventricular filling pattern is prolonged relaxation.  The left ventricular wall motion is normal.  The right ventricle is normal in size and function.  There is mild mitral regurgitation.  There is mild tricuspid regurgitation.  The aortic sinus is normal size.  IVC size was normal.  There is no pericardial effusion.  There is no comparison study available.    Exercise stress echo 12/04/2020 (Care Everywhere): SUMMARY  The patient had no chest pain.  The patient achieved 96 % of maximum predicted heart rate.  The METS achieved was 8.3.  Exercise capacity was average.    Normal left ventricular function and global wall motion with stress.  Hypertensive response to exercise.    Negative stress ECG for inducible ischemia at target heart rate.  Negative exercise echocardiography for inducible ischemia at target  heart rate.     Neuro/Psych negative  neurological ROS     GI/Hepatic Neg liver ROS,GERD  Medicated,,  Endo/Other  Hyperparathyroidism s/p parathyroidectomy  Renal/GU ESRF and DialysisRenal disease (s/p kidney transplant, failed; last HD yesterday)     Musculoskeletal  (+) Arthritis ,    Abdominal  (+) + obese  Peds  Hematology  (+) Blood dyscrasia, anemia Lab Results      Component                Value               Date                      WBC                      6.6                 10/18/2023                HGB                      10.9 (L)            10/18/2023                HCT                      32.0 (L)            10/18/2023                MCV  97.9                10/18/2023                PLT                      246                 10/18/2023              Anesthesia Other Findings   Reproductive/Obstetrics CIN III s/p LEEP, h/o left breast cancer                              Anesthesia Physical Anesthesia Plan  ASA: 3  Anesthesia Plan: Regional and MAC   Post-op Pain Management: Regional block* and Tylenol  PO (pre-op )*   Induction: Intravenous  PONV Risk Score and Plan: 2 and Ondansetron , Dexamethasone , Propofol  infusion, TIVA and Treatment may vary due to age or medical condition  Airway Management Planned: Natural Airway and Simple Face Mask  Additional Equipment:   Intra-op Plan:   Post-operative Plan:   Informed Consent: I have reviewed the patients History and Physical, chart, labs and discussed the procedure including the risks, benefits and alternatives for the proposed anesthesia with the patient or authorized representative who has indicated his/her understanding and acceptance.     Dental advisory given  Plan Discussed with:   Anesthesia Plan Comments: (Discussed potential risks of nerve blocks including, but not limited to, infection, bleeding, nerve damage, seizures, pneumothorax, respiratory depression, and potential  failure of the block. Alternatives to nerve blocks discussed. All questions answered.  Discussed with patient risks of MAC including, but not limited to, minor pain or discomfort, hearing people in the room, and possible need for backup general anesthesia. Risks for general anesthesia also discussed including, but not limited to, sore throat, hoarse voice, chipped/damaged teeth, injury to vocal cords, nausea and vomiting, allergic reactions, lung infection, heart attack, stroke, and death. All questions answered.   TTE 12/04/2020 (Care Everywhere): SUMMARY  The left ventricular size is normal.  There is moderate concentric left ventricular hypertrophy.  Left ventricular systolic function is normal.  LV ejection fraction = 55-60%.  Left ventricular filling pattern is prolonged relaxation.  The left ventricular wall motion is normal.  The right ventricle is normal in size and function.  There is mild mitral regurgitation.  There is mild tricuspid regurgitation.  The aortic sinus is normal size.  IVC size was normal.  There is no pericardial effusion.  There is no comparison study available.   Exercise stress echo 12/04/2020 (Care Everywhere): SUMMARY  The patient had no chest pain.  The patient achieved 96 % of maximum predicted heart rate.  The METS achieved was 8.3.  Exercise capacity was average.   Normal left ventricular function and global wall motion with stress.  Hypertensive response to exercise.   Negative stress ECG for inducible ischemia at target heart rate.  Negative exercise echocardiography for inducible ischemia at target  heart rate.    )         Anesthesia Quick Evaluation

## 2023-11-13 NOTE — Progress Notes (Signed)
 PCP - Rexanne Ingle, MD  Nephrology [facility] [place], MD ( kidney transplant 08-01-21)   Cardiologist -   PPM/ICD -  Device Orders -  Rep Notified -   Chest x-ray - 10-07-23 EKG - 10-18-23 Stress Test -  ECHO - 08-08-19 Cardiac Cath -  CPAP -   GLP-1 -  Fasting Blood Sugar -  Checks Blood Sugar /day  Blood Thinner Instructions:  Aspirin  Instructions: cont.  ERAS Protcol - npo  COVID TEST- n/a  Anesthesia review: yes, Hx of HTN, ESRD,      -------------  SDW INSTRUCTIONS given:  Your procedure is scheduled on November 14, 2023.  Report to [facility] Main Entrance A at 5:30 A.M., and check in at the Admitting office.  Call this number if you have problems the morning of surgery:  [phone]   Remember:  Do not eat  or drink after midnight the night before your surgery     Take these medicines the morning of surgery with A SIP OF WATER  acetaminophen  (TYLENOL )  amLODipine (NORVASC)  aspirin   atorvastatin (LIPITOR)  fluticasone  (FLONASE )  pantoprazole  (PROTONIX )  predniSONE  (DELTASONE )  tacrolimus  (PROGRAF )   As of today, STOP taking any Aspirin  (unless otherwise instructed by your surgeon) Aleve, Naproxen, Ibuprofen, Motrin, Advil, Goody's, BC's, all herbal medications, fish oil, and all vitamins.                      Do not wear jewelry, make up, or nail polish            Do not wear lotions, powders, perfumes/colognes, or deodorant.            Do not shave 48 hours prior to surgery.  Men may shave face and neck.            Do not bring valuables to the hospital.            [facility] is not responsible for any belongings or valuables.  Do NOT Smoke (Tobacco/Vaping) 24 hours prior to your procedure If you use a CPAP at night, you may bring all equipment for your overnight stay.   Contacts, glasses, dentures or bridgework may not be worn into surgery.      For patients admitted to the hospital, discharge time will be determined by  your treatment team.   Patients discharged the day of surgery will not be allowed to drive home, and someone needs to stay with them for 24 hours.    Special instructions:   [facility]- Preparing For Surgery  Before surgery, you can play an important role. Because skin is not sterile, your skin needs to be as free of germs as possible. You can reduce the number of germs on your skin by washing with CHG (chlorahexidine gluconate) Soap before surgery.  CHG is an antiseptic cleaner which kills germs and bonds with the skin to continue killing germs even after washing.    Oral Hygiene is also important to reduce your risk of infection.  Remember - BRUSH YOUR TEETH THE MORNING OF SURGERY WITH YOUR REGULAR TOOTHPASTE  Please do not use if you have an allergy to CHG or antibacterial soaps. If your skin becomes reddened/irritated stop using the CHG.  Do not shave (including legs and underarms) for at least 48 hours prior to first CHG shower. It is OK to shave your face.  Please follow these instructions carefully.   Shower the NIGHT BEFORE SURGERY and the [facility]  OF SURGERY with DIAL Soap.   Pat yourself dry with a CLEAN TOWEL.  Wear CLEAN PAJAMAS to bed the night before surgery  Place CLEAN SHEETS on your bed the night of your first shower and DO NOT SLEEP WITH PETS.   Day of Surgery: Please shower morning of surgery  Wear Clean/Comfortable clothing the morning of surgery Do not apply any deodorants/lotions.   Remember to brush your teeth WITH YOUR REGULAR TOOTHPASTE.

## 2023-11-14 ENCOUNTER — Other Ambulatory Visit: Payer: Self-pay

## 2023-11-14 ENCOUNTER — Encounter (HOSPITAL_COMMUNITY): Payer: Self-pay | Admitting: Vascular Surgery

## 2023-11-14 ENCOUNTER — Encounter (HOSPITAL_COMMUNITY)
Admission: RE | Disposition: A | Discharge status: Home / Self Care | Payer: Self-pay | Source: Home / Self Care | Attending: Vascular Surgery

## 2023-11-14 ENCOUNTER — Ambulatory Visit (HOSPITAL_COMMUNITY)
Admission: RE | Admit: 2023-11-14 | Discharge: 2023-11-14 | Disposition: A | Discharge status: Home / Self Care | Attending: Vascular Surgery | Admitting: Vascular Surgery

## 2023-11-14 ENCOUNTER — Ambulatory Visit (HOSPITAL_COMMUNITY): Admitting: Physician Assistant

## 2023-11-14 DIAGNOSIS — N186 End stage renal disease: Secondary | ICD-10-CM | POA: Diagnosis not present

## 2023-11-14 DIAGNOSIS — Z79899 Other long term (current) drug therapy: Secondary | ICD-10-CM | POA: Diagnosis not present

## 2023-11-14 DIAGNOSIS — Z853 Personal history of malignant neoplasm of breast: Secondary | ICD-10-CM | POA: Diagnosis not present

## 2023-11-14 DIAGNOSIS — Z992 Dependence on renal dialysis: Secondary | ICD-10-CM | POA: Diagnosis not present

## 2023-11-14 DIAGNOSIS — I120 Hypertensive chronic kidney disease with stage 5 chronic kidney disease or end stage renal disease: Secondary | ICD-10-CM

## 2023-11-14 DIAGNOSIS — Z9089 Acquired absence of other organs: Secondary | ICD-10-CM | POA: Insufficient documentation

## 2023-11-14 DIAGNOSIS — D631 Anemia in chronic kidney disease: Secondary | ICD-10-CM | POA: Diagnosis not present

## 2023-11-14 DIAGNOSIS — Z940 Kidney transplant status: Secondary | ICD-10-CM | POA: Diagnosis not present

## 2023-11-14 DIAGNOSIS — Z87891 Personal history of nicotine dependence: Secondary | ICD-10-CM | POA: Insufficient documentation

## 2023-11-14 DIAGNOSIS — E785 Hyperlipidemia, unspecified: Secondary | ICD-10-CM | POA: Insufficient documentation

## 2023-11-14 DIAGNOSIS — R200 Anesthesia of skin: Secondary | ICD-10-CM | POA: Diagnosis not present

## 2023-11-14 DIAGNOSIS — Z9889 Other specified postprocedural states: Secondary | ICD-10-CM | POA: Insufficient documentation

## 2023-11-14 DIAGNOSIS — K219 Gastro-esophageal reflux disease without esophagitis: Secondary | ICD-10-CM | POA: Insufficient documentation

## 2023-11-14 DIAGNOSIS — Z86001 Personal history of in-situ neoplasm of cervix uteri: Secondary | ICD-10-CM | POA: Insufficient documentation

## 2023-11-14 HISTORY — PX: AV FISTULA PLACEMENT: SHX1204

## 2023-11-14 LAB — POCT I-STAT, CHEM 8
BUN: 16 mg/dL (ref 8–23)
Calcium, Ion: 1.24 mmol/L (ref 1.15–1.40)
Chloride: 99 mmol/L (ref 98–111)
Creatinine, Ser: 3.5 mg/dL — ABNORMAL HIGH (ref 0.44–1.00)
Glucose, Bld: 82 mg/dL (ref 70–99)
HCT: 35 % — ABNORMAL LOW (ref 36.0–46.0)
Hemoglobin: 11.9 g/dL — ABNORMAL LOW (ref 12.0–15.0)
Potassium: 3.2 mmol/L — ABNORMAL LOW (ref 3.5–5.1)
Sodium: 138 mmol/L (ref 135–145)
TCO2: 30 mmol/L (ref 22–32)

## 2023-11-14 SURGERY — ARTERIOVENOUS (AV) FISTULA CREATION
Anesthesia: Monitor Anesthesia Care | Laterality: Left

## 2023-11-14 MED ORDER — CHLORHEXIDINE GLUCONATE 4 % EX SOLN: 60.0000 mL | Freq: Once | CUTANEOUS | Status: DC

## 2023-11-14 MED ORDER — CEFAZOLIN SODIUM-DEXTROSE 2-4 GM/100ML-% IV SOLN
2.0000 g | INTRAVENOUS | Status: AC
  Administered 2023-11-14: 2 g via INTRAVENOUS
  Filled 2023-11-14: qty 100

## 2023-11-14 MED ORDER — PHENYLEPHRINE HCL-NACL 20-0.9 MG/250ML-% IV SOLN
INTRAVENOUS | Status: DC | PRN
Start: 2023-11-14 — End: 2023-11-14
  Administered 2023-11-14: 25 ug/min via INTRAVENOUS

## 2023-11-14 MED ORDER — FENTANYL CITRATE (PF) 250 MCG/5ML IJ SOLN
INTRAMUSCULAR | Status: AC
  Filled 2023-11-14: qty 5

## 2023-11-14 MED ORDER — HEPARIN SODIUM (PORCINE) 1000 UNIT/ML IJ SOLN
INTRAMUSCULAR | Status: DC | PRN
  Administered 2023-11-14: 2000 [IU] via INTRAVENOUS

## 2023-11-14 MED ORDER — SODIUM CHLORIDE 0.9 % IV SOLN: INTRAVENOUS | Status: DC

## 2023-11-14 MED ORDER — MIDAZOLAM HCL 2 MG/2ML IJ SOLN
INTRAMUSCULAR | Status: DC | PRN
  Administered 2023-11-14: 1 mg via INTRAVENOUS

## 2023-11-14 MED ORDER — ACETAMINOPHEN 500 MG PO TABS
1000.0000 mg | ORAL_TABLET | Freq: Once | ORAL | Status: AC
  Administered 2023-11-14: 1000 mg via ORAL
  Filled 2023-11-14: qty 2

## 2023-11-14 MED ORDER — MIDAZOLAM HCL 2 MG/2ML IJ SOLN
INTRAMUSCULAR | Status: AC
  Filled 2023-11-14: qty 2

## 2023-11-14 MED ORDER — ONDANSETRON HCL 4 MG/2ML IJ SOLN
INTRAMUSCULAR | Status: DC | PRN
Start: 2023-11-14 — End: 2023-11-14
  Administered 2023-11-14: 4 mg via INTRAVENOUS

## 2023-11-14 MED ORDER — TRAMADOL HCL 50 MG PO TABS: 50.0000 mg | ORAL_TABLET | Freq: Four times a day (QID) | ORAL | 0 refills | Status: DC | PRN

## 2023-11-14 MED ORDER — FENTANYL CITRATE (PF) 100 MCG/2ML IJ SOLN: 25.0000 ug | INTRAMUSCULAR | Status: DC | PRN

## 2023-11-14 MED ORDER — OXYCODONE HCL 5 MG/5ML PO SOLN: 5.0000 mg | Freq: Once | ORAL | Status: DC | PRN

## 2023-11-14 MED ORDER — ONDANSETRON HCL 4 MG/2ML IJ SOLN
INTRAMUSCULAR | Status: AC
  Filled 2023-11-14: qty 2

## 2023-11-14 MED ORDER — BUPIVACAINE HCL (PF) 0.25 % IJ SOLN
INTRAMUSCULAR | Status: DC | PRN
  Administered 2023-11-14: 7 mL

## 2023-11-14 MED ORDER — PROPOFOL 500 MG/50ML IV EMUL
INTRAVENOUS | Status: DC | PRN
Start: 2023-11-14 — End: 2023-11-14
  Administered 2023-11-14: 60 ug/kg/min via INTRAVENOUS

## 2023-11-14 MED ORDER — OXYCODONE HCL 5 MG PO TABS: 5.0000 mg | ORAL_TABLET | Freq: Once | ORAL | Status: DC | PRN

## 2023-11-14 MED ORDER — PROMETHAZINE (PHENERGAN) 6.25MG IN NS 50ML IVPB
6.2500 mg | INTRAVENOUS | Status: DC | PRN
  Filled 2023-11-14: qty 50

## 2023-11-14 MED ORDER — FENTANYL CITRATE (PF) 250 MCG/5ML IJ SOLN
INTRAMUSCULAR | Status: DC | PRN
  Administered 2023-11-14: 100 ug via INTRAVENOUS

## 2023-11-14 MED ORDER — LIDOCAINE HCL (PF) 1 % IJ SOLN
INTRAMUSCULAR | Status: AC
Start: 2023-11-14 — End: 2023-11-14
  Filled 2023-11-14: qty 30

## 2023-11-14 MED ORDER — HEPARIN 6000 UNIT IRRIGATION SOLUTION
Status: AC
  Filled 2023-11-14: qty 500

## 2023-11-14 MED ORDER — 0.9 % SODIUM CHLORIDE (POUR BTL) OPTIME
TOPICAL | Status: DC | PRN
  Administered 2023-11-14: 1000 mL

## 2023-11-14 MED ORDER — LIDOCAINE 2% (20 MG/ML) 5 ML SYRINGE
INTRAMUSCULAR | Status: DC | PRN
  Administered 2023-11-14: 20 mg via INTRAVENOUS

## 2023-11-14 MED ORDER — LIDOCAINE 2% (20 MG/ML) 5 ML SYRINGE
INTRAMUSCULAR | Status: AC
  Filled 2023-11-14: qty 5

## 2023-11-14 MED ORDER — HEPARIN 6000 UNIT IRRIGATION SOLUTION
Status: DC | PRN
  Administered 2023-11-14: 1

## 2023-11-14 MED ORDER — CHLORHEXIDINE GLUCONATE 0.12 % MT SOLN
OROMUCOSAL | Status: AC
  Administered 2023-11-14: 15 mL
  Filled 2023-11-14: qty 15

## 2023-11-14 MED ORDER — ROPIVACAINE HCL 5 MG/ML IJ SOLN
INTRAMUSCULAR | Status: DC | PRN
Start: 2023-11-14 — End: 2023-11-14
  Administered 2023-11-14: 25 mL via PERINEURAL

## 2023-11-14 SURGICAL SUPPLY — 29 items
ARMBAND PINK RESTRICT EXTREMIT (MISCELLANEOUS) ×2 IMPLANT
BAG COUNTER SPONGE SURGICOUNT (BAG) ×2 IMPLANT
BLADE CLIPPER SURG (BLADE) ×2 IMPLANT
CANISTER SUCTION 3000ML PPV (SUCTIONS) ×2 IMPLANT
CLIP TI MEDIUM 6 (CLIP) ×2 IMPLANT
CLIP TI WIDE RED SMALL 6 (CLIP) ×2 IMPLANT
COVER PROBE W GEL 5X96 (DRAPES) ×2 IMPLANT
DERMABOND ADVANCED .7 DNX12 (GAUZE/BANDAGES/DRESSINGS) ×2 IMPLANT
ELECTRODE REM PT RTRN 9FT ADLT (ELECTROSURGICAL) ×2 IMPLANT
GLOVE BIOGEL PI IND STRL 8 (GLOVE) ×2 IMPLANT
GOWN STRL REUS W/ TWL LRG LVL3 (GOWN DISPOSABLE) ×4 IMPLANT
GOWN STRL REUS W/TWL 2XL LVL3 (GOWN DISPOSABLE) ×4 IMPLANT
KIT BASIN OR (CUSTOM PROCEDURE TRAY) ×2 IMPLANT
KIT TURNOVER KIT B (KITS) ×2 IMPLANT
PACK CV ACCESS (CUSTOM PROCEDURE TRAY) ×2 IMPLANT
PAD ARMBOARD POSITIONER FOAM (MISCELLANEOUS) ×4 IMPLANT
SLING ARM FOAM STRAP LRG (SOFTGOODS) IMPLANT
SOLN 0.9% NACL 1000 ML (IV SOLUTION) ×1 IMPLANT
SOLN 0.9% NACL POUR BTL 1000ML (IV SOLUTION) ×2 IMPLANT
SOLN STERILE WATER 1000 ML (IV SOLUTION) ×1 IMPLANT
SOLN STERILE WATER BTL 1000 ML (IV SOLUTION) ×2 IMPLANT
SPIKE FLUID TRANSFER (MISCELLANEOUS) ×2 IMPLANT
SUT MNCRL AB 4-0 PS2 18 (SUTURE) ×2 IMPLANT
SUT PROLENE 6 0 BV (SUTURE) ×2 IMPLANT
SUT PROLENE 7 0 BV 1 (SUTURE) IMPLANT
SUT SILK 2 0 SH (SUTURE) IMPLANT
SUT VIC AB 3-0 SH 27X BRD (SUTURE) ×2 IMPLANT
TOWEL GREEN STERILE (TOWEL DISPOSABLE) ×2 IMPLANT
UNDERPAD 30X36 HEAVY ABSORB (UNDERPADS AND DIAPERS) ×2 IMPLANT

## 2023-11-14 NOTE — Discharge Instructions (Signed)
[facility]  Discharge Instructions  AV Fistula or Graft Surgery for Dialysis Access  Please refer to the following instructions for your post-procedure care. Your surgeon or physician assistant will discuss any changes with you.  Activity  You may drive the day following your surgery, if you are comfortable and no longer taking prescription pain medication. Resume full activity as the soreness in your incision resolves.  Bathing/Showering  You may shower after you go home. Keep your incision dry for 48 hours. Do not soak in a bathtub, hot tub, or swim until the incision heals completely. You may not shower if you have a hemodialysis catheter.  Incision Care  Clean your incision with mild soap and water after 48 hours. Pat the area dry with a clean towel. You do not need a bandage unless otherwise instructed. Do not apply any ointments or creams to your incision. You may have skin glue on your incision. Do not peel it off. It will come off on its own in about one week. Your arm may swell a bit after surgery. To reduce swelling use pillows to elevate your arm so it is above your heart. Your doctor will tell you if you need to lightly wrap your arm with an ACE bandage.  Diet  Resume your normal diet. There are not special food restrictions following this procedure. In order to heal from your surgery, it is CRITICAL to get adequate nutrition. Your body requires vitamins, minerals, and protein. Vegetables are the best source of vitamins and minerals. Vegetables also provide the perfect balance of protein. Processed food has little nutritional value, so try to avoid this.  Medications  Resume taking all of your medications. If your incision is causing pain, you may take over-the counter pain relievers such as acetaminophen  (Tylenol ). If you were prescribed a stronger pain medication, please be aware these medications can cause nausea and constipation. Prevent  nausea by taking the medication with a snack or meal. Avoid constipation by drinking plenty of fluids and eating foods with high amount of fiber, such as fruits, vegetables, and grains.  Do not take Tylenol  if you are taking prescription pain medications.  Follow up Your surgeon may want to see you in the office following your access surgery. If so, this will be arranged at the time of your surgery.  Please call us  immediately for any of the following conditions:  Increased pain, redness, drainage (pus) from your incision site Fever of 101 degrees or higher Severe or worsening pain at your incision site Hand pain or numbness.  Reduce your risk of vascular disease:  Stop smoking. If you would like help, call [facility] at 1-800-QUIT-NOW (347-502-7282) or [facility] at 713-574-8405  Manage your cholesterol Maintain a desired weight Control your diabetes Keep your blood pressure down  Dialysis  It will take several weeks to several months for your new dialysis access to be ready for use. Your surgeon will determine when it is okay to use it. Your nephrologist will continue to direct your dialysis. You can continue to use your Permcath until your new access is ready for use.   11/14/2023 MAKAYELA SECREST [id number] May 09, 1945  Surgeon(s): [name], MD  Procedure(s): Brachial Basilic  FISTULA CREATION   May stick graft immediately   May stick graft on designated area only:   X Do not stick left AV fistula for 12 weeks    If you have any questions, please call the office at  [phone]. 

## 2023-11-14 NOTE — Progress Notes (Signed)
 Office Note   Patient seen and examined in preop holding.  No complaints. No changes to medication history or physical exam since last seen in clinic. After discussing the risks and benefits of left arm first stage brachiobasilic fistula, Karen Myers elected to proceed.   Karen Myers   CC:  Chronic Kidney Disease Requesting Provider:  No ref. provider found  HPI: Karen Myers is a Right handed 78 y.o. (14-Jun-1945) female with kidney disease who presents at the request of No ref. provider found for permanent HD access. The patient has had no prior access procedures. Currently CKD5 based on GFR.  She was last seen in 2023 with plans for left brachiobasilic fistula creation, however received a kidney.  Unfortunately, this kidney only worked for 2 years.  Karen Myers is currently dialyzed through a right sided tunneled IJ catheter.  On exam, Karen Myers was doing well.  She had no complaints.  We discussed the role of hemodialysis as well as the steps of fistula creation and maturation.  She stated she is no longer a candidate for transplant due to issues with antirejection medications.  The pt is  on a statin for cholesterol management.  The pt is  on a daily aspirin .   Other AC:  - The pt is  on medications for hypertension.   The pt is not diabetic. Tobacco hx:  -  Past Medical History:  Diagnosis Date   Arthritis    Breast cancer (HCC)    left breast cancer    Breast cancer (HCC)    Chronic kidney disease    CIN III (cervical intraepithelial neoplasia III)    prior to LEEP procedure   GERD (gastroesophageal reflux disease)    High cholesterol    Hypertension     Past Surgical History:  Procedure Laterality Date   BREAST LUMPECTOMY     CERVICAL BIOPSY  W/ LOOP ELECTRODE EXCISION  2005   CHOLECYSTECTOMY N/A 03/18/2021   Procedure: LAPAROSCOPIC CHOLECYSTECTOMY WITH ICG;  Surgeon: Teresa Lonni HERO, [place];  Location: [facility] OR;  Service: General;  Laterality:  N/A;   COLONOSCOPY     HYSTEROSCOPY WITH D & C  2005   IR TUNNELED CENTRAL VENOUS CATH PLC W IMG  10/05/2023   MASTECTOMY  2002   bilateral    PARATHYROIDECTOMY N/A 06/01/2017   Procedure: PARATHYROIDECTOMY;  Surgeon: Eletha Boas, [place];  Location: [facility] ORS;  Service: General;  Laterality: N/A;   PARATHYROIDECTOMY N/A 12/07/2018   Procedure: NECK EXPLORATION, RIGHT INFERIOR PARATHYROIDECTOMY, LEFT THYROID  LOBECTOMY;  Surgeon: Eletha Boas, [place];  Location: [facility] ORS;  Service: General;  Laterality: N/A;   POLYPECTOMY     RENAL BIOPSY     VULVA MARYBETH BIOPSY     nevus on vulva     Social History   Socioeconomic History   Marital status: Married    Spouse name: Not on file   Number of children: Not on file   Years of education: Not on file   Highest education level: Not on file  Occupational History   Not on file  Tobacco Use   Smoking status: Former   Smokeless tobacco: Never  Vaping Use   Vaping status: Never Used  Substance and Sexual Activity   Alcohol use: No   Drug use: No   Sexual activity: Yes  Other Topics Concern   Not on file  Social History Narrative   Not on file   Social Drivers of Health   Financial Resource Strain:  Not on file  Food Insecurity: Low Risk  (09/27/2023)   Received from [facility]   Hunger Vital Sign    Within the past 12 months, you worried that your food would run out before you got money to buy more: Never true    Within the past 12 months, the food you bought just didn't last and you didn't have money to get more. : Never true  Transportation Needs: No Transportation Needs (09/27/2023)   Received from [facility]    In the past 12 months, has lack of reliable transportation kept you from medical appointments, meetings, work or from getting things needed for daily living? : No  Physical Activity: Not on file  Stress: Not on file  Social Connections: Not on file  Intimate Partner Violence: Not on file   History  reviewed. No pertinent family history.  Current Facility-Administered Medications  Medication Dose Route Frequency Provider Last Rate Last Admin   0.9 %  sodium chloride  infusion   Intravenous Continuous Lanis Karen BRAVO, [place]       ceFAZolin  (ANCEF ) IVPB 2g/100 mL premix  2 g Intravenous 30 min Pre-Op  Meg Niemeier E, [facility]       chlorhexidine  (HIBICLENS ) 4 % liquid 4 Application  60 mL Topical Once Kyron Schlitt E, [facility]       And   [START ON 11/15/2023] chlorhexidine  (HIBICLENS ) 4 % liquid 4 Application  60 mL Topical Once Kannon Granderson E, Myers        Allergies  Allergen Reactions   Altace [Ramipril] Swelling    Swelling of lips, hypotension   Lisinopril     Other reaction(s): angioedema   Losartan  Other (See Comments)     REVIEW OF SYSTEMS:   [X]  denotes positive finding, [ ]  denotes negative finding Cardiac  Comments:  Chest pain or chest pressure:    Shortness of breath upon exertion:    Short of breath when lying flat:    Irregular heart rhythm:        Vascular    Pain in calf, thigh, or hip brought on by ambulation:    Pain in feet at night that wakes you up from your sleep:     Blood clot in your veins:    Leg swelling:         Pulmonary    Oxygen at home:    Productive cough:     Wheezing:         Neurologic    Sudden weakness in arms or legs:     Sudden numbness in arms or legs:     Sudden onset of difficulty speaking or slurred speech:    Temporary loss of vision in one eye:     Problems with dizziness:         Gastrointestinal    Blood in stool:     Vomited blood:         Genitourinary    Burning when urinating:     Blood in urine:        Psychiatric    Major depression:         Hematologic    Bleeding problems:    Problems with blood clotting too easily:        Skin    Rashes or ulcers:        Constitutional    Fever or chills:      PHYSICAL EXAMINATION:  Vitals:   11/13/23 1734 11/14/23 0549  BP:  ROLLEN)  163/100  Pulse:  72  Resp:  18   Temp:  (!) 97.5 F (36.4 C)  TempSrc:  Oral  SpO2:  95%  Weight: 53 kg 113 kg  Height: 5' 7 (1.702 m) 5' 7 (1.702 m)    General:  WDWN in NAD; vital signs documented above Gait: Not observed HENT: WNL, normocephalic Pulmonary: normal non-labored breathing , without Rales, rhonchi,  wheezing Cardiac: regular HR,  Abdomen: soft, NT, no masses Skin: without rashes Vascular Exam/Pulses:  Right Left  Radial 2+ (normal) 2+ (normal)  Ulnar 2+ (normal) 2+ (normal)                   Extremities: without ischemic changes, without Gangrene , without cellulitis; without open wounds;  Musculoskeletal: no muscle wasting or atrophy  Neurologic: A&O X 3;  No focal weakness or paresthesias are detected Psychiatric:  The pt has Normal affect.  +-----------------+-------------+----------+--------------+  Right Cephalic   Diameter (cm)Depth (cm)   Findings     +-----------------+-------------+----------+--------------+  Shoulder                               not visualized  +-----------------+-------------+----------+--------------+  Prox upper arm       0.04                               +-----------------+-------------+----------+--------------+  Mid upper arm        0.04                               +-----------------+-------------+----------+--------------+  Dist upper arm       0.12                               +-----------------+-------------+----------+--------------+  Antecubital fossa    0.06                               +-----------------+-------------+----------+--------------+  Prox forearm         0.10                               +-----------------+-------------+----------+--------------+  Mid forearm          0.30                               +-----------------+-------------+----------+--------------+  Dist forearm         0.19                               +-----------------+-------------+----------+--------------+    +-----------------+-------------+----------+---------+  Right Basilic    Diameter (cm)Depth (cm)Findings   +-----------------+-------------+----------+---------+  Prox upper arm       0.72                          +-----------------+-------------+----------+---------+  Mid upper arm        0.60                          +-----------------+-------------+----------+---------+  Dist upper arm  0.62                          +-----------------+-------------+----------+---------+  Antecubital fossa    0.42                          +-----------------+-------------+----------+---------+  Prox forearm         0.18               branching  +-----------------+-------------+----------+---------+  Mid forearm          0.11               branching  +-----------------+-------------+----------+---------+  Distal forearm       0.09               branching  +-----------------+-------------+----------+---------+   +-----------------+-------------+----------+--------------+  Left Cephalic    Diameter (cm)Depth (cm)   Findings     +-----------------+-------------+----------+--------------+  Shoulder                               not visualized  +-----------------+-------------+----------+--------------+  Prox upper arm       0.07                               +-----------------+-------------+----------+--------------+  Mid upper arm        0.06                               +-----------------+-------------+----------+--------------+  Dist upper arm       0.08                               +-----------------+-------------+----------+--------------+  Antecubital fossa    0.06                               +-----------------+-------------+----------+--------------+  Prox forearm         0.05                               +-----------------+-------------+----------+--------------+  Mid forearm          0.15                                +-----------------+-------------+----------+--------------+  Dist forearm         0.10                               +-----------------+-------------+----------+--------------+   +-----------------+-------------+----------+--------+  Left Basilic     Diameter (cm)Depth (cm)Findings  +-----------------+-------------+----------+--------+  Prox upper arm       0.58                         +-----------------+-------------+----------+--------+  Mid upper arm        0.44                         +-----------------+-------------+----------+--------+  Dist upper arm       0.35                         +-----------------+-------------+----------+--------+  Antecubital fossa    0.27                         +-----------------+-------------+----------+--------+  Prox forearm         0.17                         +-----------------+-------------+----------+--------+  Mid forearm          0.10                         +-----------------+-------------+----------+--------+  Distal forearm       0.08                         +-----------------+-------------+----------+--------+   ASSESSMENT/PLAN:  AURORE REDINGER is a 78 y.o. female who presents with chronic kidney disease stage 5  Based on vein mapping and examination, patient has suitable vein for brachiobasilic fistula bilaterally Annamarie would be best served with staged, left-sided brachiobasilic fistula. I had an extensive discussion with this patient in regards to the nature of access surgery, including risk, benefits, and alternatives.   The patient is aware that the risks of access surgery include but are not limited to: bleeding, infection, steal syndrome, nerve damage, ischemic monomelic neuropathy, failure of access to mature, complications related to venous hypertension, and possible need for additional access procedures in the future. I discussed with the patient the nature of  the staged access procedure, specifically the need for a second operation to transpose the first stage fistula.  After discussing the risks and benefits, Tikisha elected to proceed.  This will be scheduled at her convenience.  Karen FORBES Rim, [facility] [phone]

## 2023-11-14 NOTE — Anesthesia Postprocedure Evaluation (Signed)
 Anesthesia Post Note  Patient: Karen Myers  Procedure(s) Performed: Brachial Basilic  FISTULA CREATION (Left)     Patient location during evaluation: PACU Anesthesia Type: Regional and MAC Level of consciousness: awake Pain management: pain level controlled Vital Signs Assessment: post-procedure vital signs reviewed and stable Respiratory status: spontaneous breathing, nonlabored ventilation and respiratory function stable Cardiovascular status: stable and blood pressure returned to baseline Postop Assessment: no apparent nausea or vomiting Anesthetic complications: no   No notable events documented.  Last Vitals:  Vitals:   11/14/23 0939 11/14/23 0940  BP: 128/78   Pulse: 73 75  Resp: 14 13  Temp:  36.4 C  SpO2: 95% 96%    Last Pain:  Vitals:   11/14/23 0940  TempSrc:   PainSc: 0-No pain                 Delon Aisha Arch

## 2023-11-14 NOTE — Anesthesia Procedure Notes (Addendum)
 Anesthesia Regional Block: Supraclavicular block   Pre-Anesthetic Checklist: , timeout performed,  Correct Patient, Correct Site, Correct Laterality,  Correct Procedure, Correct Position, site marked,  Risks and benefits discussed,  Surgical consent,  Pre-op  evaluation,  At surgeon's request and post-op pain management  Laterality: Left  Prep: chloraprep       Needles:  Injection technique: Single-shot  Needle Type: Echogenic Stimulator Needle     Needle Length: 9cm  Needle Gauge: 21     Additional Needles:   Procedures:,,,, ultrasound used (permanent image in chart),,    Narrative:  Start time: 11/14/2023 7:14 AM End time: 11/14/2023 7:20 AM Injection made incrementally with aspirations every 5 mL.  Performed by: Personally  Anesthesiologist: Peggye Delon Brunswick, MD  Additional Notes: Discussed risks and benefits of nerve block including, but not limited to, prolonged and/or permanent nerve injury involving sensory and/or motor function. Monitors were applied and a time-out was performed. The nerve and associated structures were visualized under ultrasound guidance. After negative aspiration, local anesthetic was slowly injected around the nerve. There was no evidence of high pressure during the procedure. There were no paresthesias. VSS remained stable and the patient tolerated the procedure well.  Axillary ring performed for intercostobrachial nerve using 7 mL of 0.25% bupivacaine .

## 2023-11-14 NOTE — H&P (Signed)
 Office Note   Patient seen and examined in preop holding.  No complaints. No changes to medication history or physical exam since last seen in clinic. After discussing the risks and benefits of left arm first stage brachiobasilic fistula, Karen Myers elected to proceed.   [name] MD   CC:  Chronic Kidney Disease Requesting Provider:  No ref. provider found  HPI: Karen Myers is a Right handed 78 y.o. (14-Aug-1945) female with kidney disease who presents at the request of No ref. provider found for permanent HD access. The patient has had no prior access procedures. Currently CKD5 based on GFR.  She was last seen in 2023 with plans for left brachiobasilic fistula creation, however received a kidney.  Unfortunately, this kidney only worked for 2 years.  Karen Myers is currently dialyzed through a right sided tunneled IJ catheter.  On exam, Karen Myers was doing well.  She had no complaints.  We discussed the role of hemodialysis as well as the steps of fistula creation and maturation.  She stated she is no longer a candidate for transplant due to issues with antirejection medications.  The pt is  on a statin for cholesterol management.  The pt is  on a daily aspirin .   Other AC:  - The pt is  on medications for hypertension.   The pt is not diabetic. Tobacco hx:  -  Past Medical History:  Diagnosis Date   Arthritis    Breast cancer (HCC)    left breast cancer    Breast cancer (HCC)    Chronic kidney disease    CIN III (cervical intraepithelial neoplasia III)    prior to LEEP procedure   GERD (gastroesophageal reflux disease)    High cholesterol    Hypertension     Past Surgical History:  Procedure Laterality Date   BREAST LUMPECTOMY     CERVICAL BIOPSY  W/ LOOP ELECTRODE EXCISION  2005   CHOLECYSTECTOMY N/A 03/18/2021   Procedure: LAPAROSCOPIC CHOLECYSTECTOMY WITH ICG;  Surgeon: Teresa Lonni HERO, MD;  Location: [facility] OR;  Service: General;  Laterality:  N/A;   COLONOSCOPY     HYSTEROSCOPY WITH D & C  2005   IR TUNNELED CENTRAL VENOUS CATH PLC W IMG  10/05/2023   MASTECTOMY  2002   bilateral    PARATHYROIDECTOMY N/A 06/01/2017   Procedure: PARATHYROIDECTOMY;  Surgeon: Eletha Boas, MD;  Location: [facility];  Service: General;  Laterality: N/A;   PARATHYROIDECTOMY N/A 12/07/2018   Procedure: NECK EXPLORATION, RIGHT INFERIOR PARATHYROIDECTOMY, LEFT THYROID  LOBECTOMY;  Surgeon: Eletha Boas, MD;  Location: [facility];  Service: General;  Laterality: N/A;   POLYPECTOMY     RENAL BIOPSY     VULVA MARYBETH BIOPSY     nevus on vulva     Social History   Socioeconomic History   Marital status: Married    Spouse name: Not on file   Number of children: Not on file   Years of education: Not on file   Highest education level: Not on file  Occupational History   Not on file  Tobacco Use   Smoking status: Former   Smokeless tobacco: Never  Vaping Use   Vaping status: Never Used  Substance and Sexual Activity   Alcohol use: No   Drug use: No   Sexual activity: Yes  Other Topics Concern   Not on file  Social History Narrative   Not on file   Social Drivers of Health   Financial Resource Strain:  Not on file  Food Insecurity: Low Risk  (09/27/2023)   Received from [facility]   Hunger Vital Sign    Within the past 12 months, you worried that your food would run out before you got money to buy more: Never true    Within the past 12 months, the food you bought just didn't last and you didn't have money to get more. : Never true  Transportation Needs: No Transportation Needs (09/27/2023)   Received from [facility]    In the past 12 months, has lack of reliable transportation kept you from medical appointments, meetings, work or from getting things needed for daily living? : No  Physical Activity: Not on file  Stress: Not on file  Social Connections: Not on file  Intimate Partner Violence: Not on file   History  reviewed. No pertinent family history.  Current Facility-Administered Medications  Medication Dose Route Frequency Provider Last Rate Last Admin   0.9 %  sodium chloride  infusion   Intravenous Continuous [name], MD       ceFAZolin  (ANCEF ) IVPB 2g/100 mL premix  2 g Intravenous 30 min Pre-Op  Tiersa Dayley E, MD       chlorhexidine  (HIBICLENS ) 4 % liquid 4 Application  60 mL Topical Once Jakyri Brunkhorst E, MD       And   [START ON 11/15/2023] chlorhexidine  (HIBICLENS ) 4 % liquid 4 Application  60 mL Topical Once Sayana Salley E, MD        Allergies  Allergen Reactions   Altace [Ramipril] Swelling    Swelling of lips, hypotension   Lisinopril     Other reaction(s): angioedema   Losartan  Other (See Comments)     REVIEW OF SYSTEMS:   [X]  denotes positive finding, [ ]  denotes negative finding Cardiac  Comments:  Chest pain or chest pressure:    Shortness of breath upon exertion:    Short of breath when lying flat:    Irregular heart rhythm:        Vascular    Pain in calf, thigh, or hip brought on by ambulation:    Pain in feet at night that wakes you up from your sleep:     Blood clot in your veins:    Leg swelling:         Pulmonary    Oxygen at home:    Productive cough:     Wheezing:         Neurologic    Sudden weakness in arms or legs:     Sudden numbness in arms or legs:     Sudden onset of difficulty speaking or slurred speech:    Temporary loss of vision in one eye:     Problems with dizziness:         Gastrointestinal    Blood in stool:     Vomited blood:         Genitourinary    Burning when urinating:     Blood in urine:        Psychiatric    Major depression:         Hematologic    Bleeding problems:    Problems with blood clotting too easily:        Skin    Rashes or ulcers:        Constitutional    Fever or chills:      PHYSICAL EXAMINATION:  Vitals:   11/13/23 1734 11/14/23 0549  BP:  ROLLEN)  163/100  Pulse:  72  Resp:  18   Temp:  (!) 97.5 F (36.4 C)  TempSrc:  Oral  SpO2:  95%  Weight: 53 kg 113 kg  Height: 5' 7 (1.702 m) 5' 7 (1.702 m)    General:  WDWN in NAD; vital signs documented above Gait: Not observed HENT: WNL, normocephalic Pulmonary: normal non-labored breathing , without Rales, rhonchi,  wheezing Cardiac: regular HR,  Abdomen: soft, NT, no masses Skin: without rashes Vascular Exam/Pulses:  Right Left  Radial 2+ (normal) 2+ (normal)  Ulnar 2+ (normal) 2+ (normal)                   Extremities: without ischemic changes, without Gangrene , without cellulitis; without open wounds;  Musculoskeletal: no muscle wasting or atrophy  Neurologic: A&O X 3;  No focal weakness or paresthesias are detected Psychiatric:  The pt has Normal affect.  +-----------------+-------------+----------+--------------+  Right Cephalic   Diameter (cm)Depth (cm)   Findings     +-----------------+-------------+----------+--------------+  Shoulder                               not visualized  +-----------------+-------------+----------+--------------+  Prox upper arm       0.04                               +-----------------+-------------+----------+--------------+  Mid upper arm        0.04                               +-----------------+-------------+----------+--------------+  Dist upper arm       0.12                               +-----------------+-------------+----------+--------------+  Antecubital fossa    0.06                               +-----------------+-------------+----------+--------------+  Prox forearm         0.10                               +-----------------+-------------+----------+--------------+  Mid forearm          0.30                               +-----------------+-------------+----------+--------------+  Dist forearm         0.19                               +-----------------+-------------+----------+--------------+    +-----------------+-------------+----------+---------+  Right Basilic    Diameter (cm)Depth (cm)Findings   +-----------------+-------------+----------+---------+  Prox upper arm       0.72                          +-----------------+-------------+----------+---------+  Mid upper arm        0.60                          +-----------------+-------------+----------+---------+  Dist upper arm  0.62                          +-----------------+-------------+----------+---------+  Antecubital fossa    0.42                          +-----------------+-------------+----------+---------+  Prox forearm         0.18               branching  +-----------------+-------------+----------+---------+  Mid forearm          0.11               branching  +-----------------+-------------+----------+---------+  Distal forearm       0.09               branching  +-----------------+-------------+----------+---------+   +-----------------+-------------+----------+--------------+  Left Cephalic    Diameter (cm)Depth (cm)   Findings     +-----------------+-------------+----------+--------------+  Shoulder                               not visualized  +-----------------+-------------+----------+--------------+  Prox upper arm       0.07                               +-----------------+-------------+----------+--------------+  Mid upper arm        0.06                               +-----------------+-------------+----------+--------------+  Dist upper arm       0.08                               +-----------------+-------------+----------+--------------+  Antecubital fossa    0.06                               +-----------------+-------------+----------+--------------+  Prox forearm         0.05                               +-----------------+-------------+----------+--------------+  Mid forearm          0.15                                +-----------------+-------------+----------+--------------+  Dist forearm         0.10                               +-----------------+-------------+----------+--------------+   +-----------------+-------------+----------+--------+  Left Basilic     Diameter (cm)Depth (cm)Findings  +-----------------+-------------+----------+--------+  Prox upper arm       0.58                         +-----------------+-------------+----------+--------+  Mid upper arm        0.44                         +-----------------+-------------+----------+--------+  Dist upper arm       0.35                         +-----------------+-------------+----------+--------+  Antecubital fossa    0.27                         +-----------------+-------------+----------+--------+  Prox forearm         0.17                         +-----------------+-------------+----------+--------+  Mid forearm          0.10                         +-----------------+-------------+----------+--------+  Distal forearm       0.08                         +-----------------+-------------+----------+--------+   ASSESSMENT/PLAN:  RUKAYA KLEINSCHMIDT is a 78 y.o. female who presents with chronic kidney disease stage 5  Based on vein mapping and examination, patient has suitable vein for brachiobasilic fistula bilaterally Shoshana would be best served with staged, left-sided brachiobasilic fistula. I had an extensive discussion with this patient in regards to the nature of access surgery, including risk, benefits, and alternatives.   The patient is aware that the risks of access surgery include but are not limited to: bleeding, infection, steal syndrome, nerve damage, ischemic monomelic neuropathy, failure of access to mature, complications related to venous hypertension, and possible need for additional access procedures in the future. I discussed with the patient the nature of  the staged access procedure, specifically the need for a second operation to transpose the first stage fistula.  After discussing the risks and benefits, Syrah elected to proceed.  This will be scheduled at her convenience.  [name], MD [facility] [phone]

## 2023-11-14 NOTE — Progress Notes (Signed)
 Orthopedic Tech Progress Note Patient Details:  Karen Myers [birth year] [id number]  Ortho Devices Type of Ortho Device: Arm sling Ortho Device/Splint Interventions: Ordered, Other (comment)PACU RN called requesting an ARM SLING, stated he would apply when patient got dressed   Post Interventions Patient Tolerated: Other (comment) Instructions Provided: Other (comment)  Delanna LITTIE Pac 11/14/2023, 9:46 AM

## 2023-11-14 NOTE — Op Note (Addendum)
    NAME: Karen Myers    MRN: [id number] DOB: 12/25/1945    DATE OF OPERATION: 11/14/2023  PREOP DIAGNOSIS:    End stage renal disease requiring dialysis  POSTOP DIAGNOSIS:    Same  PROCEDURE:    Left arm brachiobasilic fistula   SURGEON: [name]  ASSIST: Curry Damme, PA  ANESTHESIA: Block, MAc   EBL: 5ml  INDICATIONS:    Karen Myers is a 78 y.o. female with end-stage renal disease currently dialyzed through a right sided tunneled dialysis catheter.  She presented to my office in need for long-term HD access.  After discussing the risks and benefits of left arm brachiobasilic fistula, Karen Myers elected to proceed.  FINDINGS:   5 mm basilic vein, 4.5 mm brachial artery  TECHNIQUE:   A left upper extremity block was performed by the anesthesia team.   The patient was brought to the operating room and placed in supine position. The left arm was prepped and draped in a standard fashion. IV antibiotics were prior to incision. A timeout was performed.   The basilic vein in the left arm was identified using ultrasound and appeared of sufficient size. A transverse incision was made above the elbow creese in the antecubital fossa. The basilic vein was identified and isolated for 4 cm in length.  The bicipital aponeurosis was partially released and the brachial artery freed from its paired brachial veins and secured with a vessel loop. The patient was heparinized. The basilic vein was marked and ligated distally with 2-0 silk, then flushed with heparinized saline. Vascular clamps were placed proximally and distally on the brachial artery and a 5 mm arteriotomy  was created on the brachial artery. This was flushed with heparin  saline. The vein was juxtaposed to the artery and an anastomosis was created using 6-0 Prolene.   Prior to completing the anastomsis, the vessels were flushed and the suture line was tied down. There was an excellent thrill in the basilic  vein from the anastomosis into the upper arm. The patient had a 2+ radial pulse. The incision was irrigated and hemostasis acheived. The deeper tissue was closed with 3-0 Vicryl and the skin closed with 4-0 Monocryl.    Dermabond was applied the incisions. He was transferred to PACU in stable condition.    Given the complexity of the case,  the assistant was necessary in order to expedient the procedure and safely perform the technical aspects of the operation.  The assistant provided traction and countertraction to assist with exposure of the artery and vein.  They also assisted with suture ligation of multiple venous branches.  They played a critical role in the anastomosis. These skills, especially following the Prolene suture for the anastomosis, could not have been adequately performed by a scrub tech assistant.   [name], MD [facility] DATE OF DICTATION:   11/14/2023

## 2023-11-14 NOTE — Transfer of Care (Signed)
 Immediate Anesthesia Transfer of Care Note  Patient: Karen Myers  Procedure(s) Performed: Brachial Basilic  FISTULA CREATION (Left)  Patient Location: PACU  Anesthesia Type:MAC combined with regional for post-op pain  Level of Consciousness: drowsy  Airway & Oxygen Therapy: Patient Spontanous Breathing and Patient connected to nasal cannula oxygen  Post-op Assessment: Report given to RN and Post -op Vital signs reviewed and stable  Post vital signs: Reviewed and stable  Last Vitals:  Vitals Value Taken Time  BP 147/79 11/14/23 08:42  Temp    Pulse 74 11/14/23 08:45  Resp 10 11/14/23 08:45  SpO2 100 % 11/14/23 08:45  Vitals shown include unfiled device data.  Last Pain:  Vitals:   11/14/23 0649  TempSrc:   PainSc: 8       Patients Stated Pain Goal: 5 (11/14/23 9365)  Complications: No notable events documented.

## 2023-11-15 ENCOUNTER — Encounter (HOSPITAL_COMMUNITY): Payer: Self-pay | Admitting: Vascular Surgery

## 2023-11-21 ENCOUNTER — Other Ambulatory Visit: Payer: Self-pay

## 2023-11-21 ENCOUNTER — Other Ambulatory Visit: Payer: Self-pay | Admitting: Vascular Surgery

## 2023-11-21 DIAGNOSIS — N186 End stage renal disease: Secondary | ICD-10-CM

## 2023-11-30 DIAGNOSIS — N186 End stage renal disease: Secondary | ICD-10-CM | POA: Diagnosis not present

## 2023-11-30 DIAGNOSIS — E213 Hyperparathyroidism, unspecified: Secondary | ICD-10-CM | POA: Diagnosis not present

## 2023-11-30 DIAGNOSIS — Z940 Kidney transplant status: Secondary | ICD-10-CM | POA: Diagnosis not present

## 2023-11-30 DIAGNOSIS — Z853 Personal history of malignant neoplasm of breast: Secondary | ICD-10-CM | POA: Diagnosis not present

## 2023-11-30 DIAGNOSIS — R7303 Prediabetes: Secondary | ICD-10-CM | POA: Diagnosis not present

## 2023-11-30 DIAGNOSIS — I10 Essential (primary) hypertension: Secondary | ICD-10-CM | POA: Diagnosis not present

## 2023-11-30 DIAGNOSIS — N189 Chronic kidney disease, unspecified: Secondary | ICD-10-CM | POA: Diagnosis not present

## 2023-11-30 DIAGNOSIS — Z7189 Other specified counseling: Secondary | ICD-10-CM | POA: Diagnosis not present

## 2023-11-30 DIAGNOSIS — E7800 Pure hypercholesterolemia, unspecified: Secondary | ICD-10-CM | POA: Diagnosis not present

## 2023-11-30 DIAGNOSIS — M8580 Other specified disorders of bone density and structure, unspecified site: Secondary | ICD-10-CM | POA: Diagnosis not present

## 2023-12-14 ENCOUNTER — Ambulatory Visit (INDEPENDENT_AMBULATORY_CARE_PROVIDER_SITE_OTHER): Admitting: Physician Assistant

## 2023-12-14 ENCOUNTER — Encounter: Payer: Self-pay | Admitting: Physician Assistant

## 2023-12-14 ENCOUNTER — Ambulatory Visit (HOSPITAL_COMMUNITY)
Admission: RE | Admit: 2023-12-14 | Discharge: 2023-12-14 | Disposition: A | Discharge status: Home / Self Care | Source: Ambulatory Visit | Attending: Vascular Surgery | Admitting: Vascular Surgery

## 2023-12-14 VITALS — BP 172/87 | Temp 97.7°F

## 2023-12-14 DIAGNOSIS — N186 End stage renal disease: Secondary | ICD-10-CM | POA: Diagnosis present

## 2023-12-15 ENCOUNTER — Telehealth: Payer: Self-pay

## 2023-12-15 NOTE — Telephone Encounter (Signed)
 Attempted to call for surgery scheduling. LVM

## 2023-12-18 NOTE — Progress Notes (Signed)
 POST OPERATIVE OFFICE NOTE    CC:  F/u for surgery  HPI:  Karen Myers is a 78 y.o. female who returns today for postop visit.  She recently underwent creation of a left arm brachiobasilic fistula on 11/14/2023 by Dr. Lanis.  She returns today for follow-up.  She has no complaints at today's office visit.  She denies any issues with her left arm incisions such as drainage, dehiscence, or tenderness.  She denies any symptoms of steal in the left hand such as weakness, numbness, excessive coldness, or pain.  She currently dialyzes via right IJ TDC.   Allergies  Allergen Reactions   Altace [Ramipril] Swelling    Swelling of lips, hypotension   Lisinopril     Other reaction(s): angioedema   Losartan  Other (See Comments)    Current Outpatient Medications  Medication Sig Dispense Refill   acetaminophen  (TYLENOL ) 500 MG tablet Take 1,000 mg by mouth 2 (two) times daily.     amLODipine (NORVASC) 5 MG tablet Take 5 mg by mouth daily as needed.     aspirin  EC 81 MG tablet Take 81 mg by mouth daily.     atorvastatin (LIPITOR) 40 MG tablet Take 40 mg by mouth daily.     DILT-XR 240 MG 24 hr capsule Take 240 mg by mouth daily.     fluticasone  (FLONASE ) 50 MCG/ACT nasal spray Place 1 spray into both nostrils daily. 16 g 0   JANUVIA 25 MG tablet Take 25 mg by mouth daily.     pantoprazole  (PROTONIX ) 40 MG tablet Take 40 mg by mouth daily.     polyethylene glycol (MIRALAX / GLYCOLAX) 17 g packet Take 17 g by mouth as needed.     predniSONE  (DELTASONE ) 5 MG tablet Take 10 mg by mouth 2 (two) times daily.     senna-docusate (SENOKOT-S) 8.6-50 MG tablet Take 1 tablet by mouth as needed.     sevelamer carbonate (RENVELA) 800 MG tablet Take 800 mg by mouth 3 (three) times daily with meals.     tacrolimus  (PROGRAF ) 1 MG capsule Take 1 mg by mouth in the morning and at bedtime.     traMADol  (ULTRAM ) 50 MG tablet Take 1 tablet (50 mg total) by mouth every 6 (six) hours as needed for severe pain  (pain score 7-10). 8 tablet 0   No current facility-administered medications for this visit.     ROS:  See HPI  Physical Exam:  Incision: Left AC fossa incision well-healed without signs of infection or hematoma Extremities: Palpable left radial pulse.  Left brachiobasilic fistula with great thrill on exam Neuro: Intact motor and sensation of LUE  Studies: Dialysis Duplex (12/14/2023) +--------------------+----------+-----------------+--------+  AVF                PSV (cm/s)Flow Vol (mL/min)Comments  +--------------------+----------+-----------------+--------+  Native artery inflow   284          3475                 +--------------------+----------+-----------------+--------+  AVF Anastomosis        716                               +--------------------+----------+-----------------+--------+     +------------+---------+-------------+---------+---------------------------  ---+  OUTFLOW VEIN   PSV   Diameter (cm)  Depth             Describe                           (  cm/s)                 (cm)                                    +------------+---------+-------------+---------+---------------------------  ---+  Prox UA        152       0.89       0.57                                    +------------+---------+-------------+---------+---------------------------  ---+  Mid UA         121       0.84       0.48     competing branch  measuring                                                           0.2cm                +------------+---------+-------------+---------+---------------------------  ---+  Dist UA        134       0.64       0.44     competing branch  measuring                                                           0.5cm                +------------+---------+-------------+---------+---------------------------  ---+  AC Fossa       369       0.64       0.21                                     +------------+---------+-------------+---------+-------     Assessment/Plan:  This is a 78 y.o. female who is here for postop visit  - The patient recently underwent creation of a left brachiobasilic AV fistula for permanent dialysis access.  Her left arm incision is well-healed without signs of infection or hematoma -Duplex demonstrates a well matured fistula with diameters greater than 6 mm and flow volume of 3475 mL/min - She denies any symptoms of steal in the left hand such as weakness, numbness, excessive coldness, or pain.  She has a palpable left radial pulse.  She has intact motor and sensation in the left hand -On exam her left brachiobasilic fistula has a very strong thrill throughout the upper arm - She will continue dialysis through her right IJ TDC.  We will plan for left brachiobasilic fistula transposition and superficialization within the next couple of weeks with Dr. Lanis on a nondialysis day. She is agreeable    Ahmed Holster, PA-C [facility] [phone]   Clinic MD:  Lanis

## 2023-12-20 ENCOUNTER — Other Ambulatory Visit: Payer: Self-pay

## 2023-12-20 DIAGNOSIS — N186 End stage renal disease: Secondary | ICD-10-CM

## 2024-01-25 ENCOUNTER — Encounter (HOSPITAL_COMMUNITY): Payer: Self-pay | Admitting: Vascular Surgery

## 2024-01-25 ENCOUNTER — Other Ambulatory Visit: Payer: Self-pay

## 2024-01-25 NOTE — Anesthesia Preprocedure Evaluation (Addendum)
 Anesthesia Evaluation  Patient identified by MRN, date of birth, ID band Patient awake    Reviewed: Allergy & Precautions, NPO status , Patient's Chart, lab work & pertinent test results  History of Anesthesia Complications Negative for: history of anesthetic complications  Airway Mallampati: III  TM Distance: >3 FB Neck ROM: Full   Comment: Previous grade I view with MAC 3 Dental  (+) Dental Advisory Given, Missing   Pulmonary neg shortness of breath, neg sleep apnea, neg COPD, neg recent URI, former smoker   Pulmonary exam normal breath sounds clear to auscultation       Cardiovascular hypertension, Pt. on medications (-) angina (-) Past MI, (-) Cardiac Stents and (-) CABG + dysrhythmias (iRBBB)  Rhythm:Regular Rate:Normal  HLD  TTE 12/04/2020 (Care Everywhere): SUMMARY  The left ventricular size is normal.  There is moderate concentric left ventricular hypertrophy.  Left ventricular systolic function is normal.  LV ejection fraction = 55-60%.  Left ventricular filling pattern is prolonged relaxation.  The left ventricular wall motion is normal.  The right ventricle is normal in size and function.  There is mild mitral regurgitation.  There is mild tricuspid regurgitation.  The aortic sinus is normal size.  IVC size was normal.  There is no pericardial effusion.  There is no comparison study available.    Exercise stress echo 12/04/2020 (Care Everywhere): SUMMARY  The patient had no chest pain.  The patient achieved 96 % of maximum predicted heart rate.  The METS achieved was 8.3.  Exercise capacity was average.    Normal left ventricular function and global wall motion with stress.  Hypertensive response to exercise.    Negative stress ECG for inducible ischemia at target heart rate.  Negative exercise echocardiography for inducible ischemia at target  heart rate.     Neuro/Psych negative neurological ROS      GI/Hepatic Neg liver ROS,GERD  Medicated,,  Endo/Other  Hyperparathyroidism s/p parathyroidectomy  Renal/GU ESRF and DialysisRenal disease (s/p kidney transplant, failed; last HD yesterday)     Musculoskeletal  (+) Arthritis ,    Abdominal  (+) + obese  Peds  Hematology  (+) Blood dyscrasia, anemia   Anesthesia Other Findings   Reproductive/Obstetrics CIN III s/p LEEP, h/o left breast cancer                              Anesthesia Physical Anesthesia Plan  ASA: 3  Anesthesia Plan: Regional and MAC   Post-op Pain Management: Regional block* and Tylenol  PO (pre-op )*   Induction: Intravenous  PONV Risk Score and Plan: 2 and Ondansetron , Dexamethasone , Propofol  infusion, Treatment may vary due to age or medical condition and TIVA  Airway Management Planned: Natural Airway and Simple Face Mask  Additional Equipment:   Intra-op Plan:   Post-operative Plan:   Informed Consent: I have reviewed the patients History and Physical, chart, labs and discussed the procedure including the risks, benefits and alternatives for the proposed anesthesia with the patient or authorized representative who has indicated his/her understanding and acceptance.     Dental advisory given  Plan Discussed with: Anesthesiologist  Anesthesia Plan Comments: (Discussed potential risks of nerve blocks including, but not limited to, infection, bleeding, nerve damage, seizures, pneumothorax, respiratory depression, and potential failure of the block. Alternatives to nerve blocks discussed. All questions answered.  Discussed with patient risks of MAC including, but not limited to, minor pain or discomfort, hearing people in the room,  and possible need for backup general anesthesia. Risks for general anesthesia also discussed including, but not limited to, sore throat, hoarse voice, chipped/damaged teeth, injury to vocal cords, nausea and vomiting, allergic reactions, lung  infection, heart attack, stroke, and death. All questions answered.   TTE 12/04/2020 (Care Everywhere): SUMMARY  The left ventricular size is normal.  There is moderate concentric left ventricular hypertrophy.  Left ventricular systolic function is normal.  LV ejection fraction = 55-60%.  Left ventricular filling pattern is prolonged relaxation.  The left ventricular wall motion is normal.  The right ventricle is normal in size and function.  There is mild mitral regurgitation.  There is mild tricuspid regurgitation.  The aortic sinus is normal size.  IVC size was normal.  There is no pericardial effusion.  There is no comparison study available.   Exercise stress echo 12/04/2020 (Care Everywhere): SUMMARY  The patient had no chest pain.  The patient achieved 96 % of maximum predicted heart rate.  The METS achieved was 8.3.  Exercise capacity was average.   Normal left ventricular function and global wall motion with stress.  Hypertensive response to exercise.   Negative stress ECG for inducible ischemia at target heart rate.  Negative exercise echocardiography for inducible ischemia at target  heart rate.    )         Anesthesia Quick Evaluation

## 2024-01-25 NOTE — Progress Notes (Signed)
 SDW call  Patient was given pre-op  instructions over the phone. Patient verbalized understanding of instructions provided. She denies any SOB, fever or cough    PCP - Dr. Tanda Bame Cardiologist - Dr. Lonni Nanas Nephrologist: Riccardo, dialysis M, W, F.  Hx kidney transplant Pulmonary:    PPM/ICD - denies Device Orders - na Rep Notified - na   Chest x-ray - 10/07/2023 EKG -  10/19/2023 Stress Test - 08/08/2019 ECHO - 08/08/2019 Cardiac Cath -   Sleep Study/sleep apnea/CPAP: denies  Non-diabetic  Blood Thinner Instructions: denies Aspirin  Instructions:Continue   ERAS Protcol - NPO   Anesthesia review: Yes. Kidney transplant, HTN, ESRD with dialysis  Your procedure is scheduled on Friday January 26, 2024  Report to [facility] Main Entrance A at  0530  A.M., then check in with the Admitting office.  Call this number if you have problems the morning of surgery:  [phone]   If you have any questions prior to your surgery date call [phone]: Open Monday-Friday 8am-4pm If you experience any cold or flu symptoms such as cough, fever, chills, shortness of breath, etc. between now and your scheduled surgery, please notify us  at the above number    Remember:  Do not eat or drink after midnight the night before your surgery  Take these medicines the morning of surgery with A SIP OF WATER:  Tylenol , amlodipine, ASA, atorvastatin, flonase , prednisone , prograf   As of today, STOP taking any Aleve, Naproxen, Ibuprofen, Motrin, Advil, Goody's, BC's, all herbal medications, fish oil, and all vitamins.

## 2024-01-26 ENCOUNTER — Ambulatory Visit (HOSPITAL_COMMUNITY)
Admission: RE | Admit: 2024-01-26 | Discharge: 2024-01-26 | Disposition: A | Discharge status: Home / Self Care | Attending: Vascular Surgery | Admitting: Vascular Surgery

## 2024-01-26 ENCOUNTER — Ambulatory Visit (HOSPITAL_COMMUNITY): Payer: Self-pay | Admitting: Registered Nurse

## 2024-01-26 ENCOUNTER — Encounter: Admission: RE | Payer: Self-pay

## 2024-01-26 ENCOUNTER — Other Ambulatory Visit (HOSPITAL_COMMUNITY): Payer: Self-pay

## 2024-01-26 ENCOUNTER — Encounter (HOSPITAL_COMMUNITY): Payer: Self-pay | Admitting: Vascular Surgery

## 2024-01-26 DIAGNOSIS — N186 End stage renal disease: Secondary | ICD-10-CM | POA: Insufficient documentation

## 2024-01-26 DIAGNOSIS — E785 Hyperlipidemia, unspecified: Secondary | ICD-10-CM | POA: Diagnosis not present

## 2024-01-26 DIAGNOSIS — I129 Hypertensive chronic kidney disease with stage 1 through stage 4 chronic kidney disease, or unspecified chronic kidney disease: Secondary | ICD-10-CM | POA: Insufficient documentation

## 2024-01-26 DIAGNOSIS — Z992 Dependence on renal dialysis: Secondary | ICD-10-CM

## 2024-01-26 DIAGNOSIS — K219 Gastro-esophageal reflux disease without esophagitis: Secondary | ICD-10-CM | POA: Insufficient documentation

## 2024-01-26 DIAGNOSIS — Z87891 Personal history of nicotine dependence: Secondary | ICD-10-CM | POA: Insufficient documentation

## 2024-01-26 DIAGNOSIS — Z79899 Other long term (current) drug therapy: Secondary | ICD-10-CM | POA: Insufficient documentation

## 2024-01-26 DIAGNOSIS — I120 Hypertensive chronic kidney disease with stage 5 chronic kidney disease or end stage renal disease: Secondary | ICD-10-CM

## 2024-01-26 HISTORY — PX: BASCILIC VEIN TRANSPOSITION: SHX5742

## 2024-01-26 LAB — POCT I-STAT, CHEM 8
BUN: 15 mg/dL (ref 8–23)
Calcium, Ion: 1.2 mmol/L (ref 1.15–1.40)
Chloride: 93 mmol/L — ABNORMAL LOW (ref 98–111)
Creatinine, Ser: 3.2 mg/dL — ABNORMAL HIGH (ref 0.44–1.00)
Glucose, Bld: 85 mg/dL (ref 70–99)
HCT: 39 % (ref 36.0–46.0)
Hemoglobin: 13.3 g/dL (ref 12.0–15.0)
Potassium: 3.4 mmol/L — ABNORMAL LOW (ref 3.5–5.1)
Sodium: 135 mmol/L (ref 135–145)
TCO2: 33 mmol/L — ABNORMAL HIGH (ref 22–32)

## 2024-01-26 SURGERY — TRANSPOSITION, VEIN, BASILIC
Anesthesia: Monitor Anesthesia Care | Site: Arm Upper | Laterality: Left

## 2024-01-26 MED ORDER — ONDANSETRON HCL 4 MG/2ML IJ SOLN
INTRAMUSCULAR | Status: AC
  Filled 2024-01-26: qty 2

## 2024-01-26 MED ORDER — LACTATED RINGERS IV SOLN: INTRAVENOUS | Status: DC

## 2024-01-26 MED ORDER — FENTANYL CITRATE (PF) 100 MCG/2ML IJ SOLN
INTRAMUSCULAR | Status: AC
  Filled 2024-01-26: qty 2

## 2024-01-26 MED ORDER — ROCURONIUM BROMIDE 10 MG/ML (PF) SYRINGE
PREFILLED_SYRINGE | INTRAVENOUS | Status: AC
  Filled 2024-01-26: qty 10

## 2024-01-26 MED ORDER — PROTAMINE SULFATE 10 MG/ML IV SOLN
INTRAVENOUS | Status: DC | PRN
  Administered 2024-01-26: 20 mg via INTRAVENOUS

## 2024-01-26 MED ORDER — HEPARIN SODIUM (PORCINE) 1000 UNIT/ML IJ SOLN
INTRAMUSCULAR | Status: DC | PRN
  Administered 2024-01-26: 2000 [IU] via INTRAVENOUS

## 2024-01-26 MED ORDER — CEFAZOLIN SODIUM-DEXTROSE 2-4 GM/100ML-% IV SOLN
2.0000 g | INTRAVENOUS | Status: AC
  Administered 2024-01-26: 2 g via INTRAVENOUS

## 2024-01-26 MED ORDER — DEXAMETHASONE SOD PHOSPHATE PF 10 MG/ML IJ SOLN
INTRAMUSCULAR | Status: DC | PRN
  Administered 2024-01-26: 10 mg via INTRAVENOUS

## 2024-01-26 MED ORDER — CHLORHEXIDINE GLUCONATE 4 % EX SOLN: 60.0000 mL | Freq: Once | CUTANEOUS | Status: DC

## 2024-01-26 MED ORDER — HEPARIN 6000 UNIT IRRIGATION SOLUTION
Status: AC
  Filled 2024-01-26: qty 500

## 2024-01-26 MED ORDER — LIDOCAINE HCL (PF) 1 % IJ SOLN
INTRAMUSCULAR | Status: AC
  Filled 2024-01-26: qty 30

## 2024-01-26 MED ORDER — SODIUM CHLORIDE 0.9 % IV SOLN: INTRAVENOUS | Status: DC

## 2024-01-26 MED ORDER — ONDANSETRON HCL 4 MG/2ML IJ SOLN
INTRAMUSCULAR | Status: DC | PRN
  Administered 2024-01-26: 4 mg via INTRAVENOUS

## 2024-01-26 MED ORDER — FENTANYL CITRATE (PF) 250 MCG/5ML IJ SOLN
INTRAMUSCULAR | Status: DC | PRN
  Administered 2024-01-26 (×2): 50 ug via INTRAVENOUS

## 2024-01-26 MED ORDER — MIDAZOLAM HCL 2 MG/2ML IJ SOLN
INTRAMUSCULAR | Status: AC
  Filled 2024-01-26: qty 2

## 2024-01-26 MED ORDER — CEFAZOLIN SODIUM-DEXTROSE 2-4 GM/100ML-% IV SOLN
INTRAVENOUS | Status: AC
  Filled 2024-01-26: qty 100

## 2024-01-26 MED ORDER — CHLORHEXIDINE GLUCONATE 0.12 % MT SOLN
OROMUCOSAL | Status: AC
  Administered 2024-01-26: 15 mL via OROMUCOSAL
  Filled 2024-01-26: qty 15

## 2024-01-26 MED ORDER — ACETAMINOPHEN 500 MG PO TABS: 1000.0000 mg | ORAL_TABLET | Freq: Once | ORAL | Status: AC

## 2024-01-26 MED ORDER — PROTAMINE SULFATE 10 MG/ML IV SOLN
INTRAVENOUS | Status: AC
  Filled 2024-01-26: qty 5

## 2024-01-26 MED ORDER — HEPARIN 6000 UNIT IRRIGATION SOLUTION
Status: DC | PRN
  Administered 2024-01-26: 1

## 2024-01-26 MED ORDER — SUGAMMADEX SODIUM 200 MG/2ML IV SOLN
INTRAVENOUS | Status: AC
  Filled 2024-01-26: qty 2

## 2024-01-26 MED ORDER — SUGAMMADEX SODIUM 200 MG/2ML IV SOLN
INTRAVENOUS | Status: DC | PRN
  Administered 2024-01-26: 200 mg via INTRAVENOUS

## 2024-01-26 MED ORDER — PHENYLEPHRINE 80 MCG/ML (10ML) SYRINGE FOR IV PUSH (FOR BLOOD PRESSURE SUPPORT)
PREFILLED_SYRINGE | INTRAVENOUS | Status: AC
  Filled 2024-01-26: qty 10

## 2024-01-26 MED ORDER — ORAL CARE MOUTH RINSE: 15.0000 mL | Freq: Once | OROMUCOSAL | Status: AC

## 2024-01-26 MED ORDER — LIDOCAINE 2% (20 MG/ML) 5 ML SYRINGE
INTRAMUSCULAR | Status: AC
  Filled 2024-01-26: qty 5

## 2024-01-26 MED ORDER — CHLORHEXIDINE GLUCONATE 0.12 % MT SOLN: 15.0000 mL | Freq: Once | OROMUCOSAL | Status: AC

## 2024-01-26 MED ORDER — PROPOFOL 10 MG/ML IV BOLUS
INTRAVENOUS | Status: DC | PRN
  Administered 2024-01-26: 150 mg via INTRAVENOUS
  Administered 2024-01-26: 30 mg via INTRAVENOUS
  Administered 2024-01-26: 50 mg via INTRAVENOUS

## 2024-01-26 MED ORDER — PHENYLEPHRINE HCL-NACL 20-0.9 MG/250ML-% IV SOLN
INTRAVENOUS | Status: DC | PRN
  Administered 2024-01-26: 40 ug/min via INTRAVENOUS

## 2024-01-26 MED ORDER — HEPARIN SODIUM (PORCINE) 1000 UNIT/ML IJ SOLN
INTRAMUSCULAR | Status: AC
  Filled 2024-01-26: qty 10

## 2024-01-26 MED ORDER — ACETAMINOPHEN 500 MG PO TABS
ORAL_TABLET | ORAL | Status: AC
  Administered 2024-01-26: 1000 mg via ORAL
  Filled 2024-01-26: qty 2

## 2024-01-26 MED ORDER — EPHEDRINE SULFATE-NACL 50-0.9 MG/10ML-% IV SOSY
PREFILLED_SYRINGE | INTRAVENOUS | Status: DC | PRN
  Administered 2024-01-26: 5 mg via INTRAVENOUS

## 2024-01-26 MED ORDER — EPHEDRINE 5 MG/ML INJ
INTRAVENOUS | Status: AC
  Filled 2024-01-26: qty 5

## 2024-01-26 MED ORDER — OXYCODONE HCL 5 MG PO TABS
5.0000 mg | ORAL_TABLET | ORAL | 0 refills | Status: AC | PRN
  Filled 2024-01-26: qty 15, 3d supply, fill #0

## 2024-01-26 MED ORDER — ROCURONIUM BROMIDE 10 MG/ML (PF) SYRINGE
PREFILLED_SYRINGE | INTRAVENOUS | Status: DC | PRN
  Administered 2024-01-26: 30 mg via INTRAVENOUS

## 2024-01-26 MED ORDER — LIDOCAINE 2% (20 MG/ML) 5 ML SYRINGE
INTRAMUSCULAR | Status: DC | PRN
  Administered 2024-01-26: 100 mg via INTRAVENOUS

## 2024-01-26 MED ORDER — PHENYLEPHRINE 80 MCG/ML (10ML) SYRINGE FOR IV PUSH (FOR BLOOD PRESSURE SUPPORT)
PREFILLED_SYRINGE | INTRAVENOUS | Status: DC | PRN
  Administered 2024-01-26 (×5): 160 ug via INTRAVENOUS

## 2024-01-26 MED ORDER — 0.9 % SODIUM CHLORIDE (POUR BTL) OPTIME
TOPICAL | Status: DC | PRN
  Administered 2024-01-26: 1000 mL

## 2024-01-26 MED ORDER — HEMOSTATIC AGENTS (NO CHARGE) OPTIME
TOPICAL | Status: DC | PRN
  Administered 2024-01-26: 2 via TOPICAL

## 2024-01-26 SURGICAL SUPPLY — 29 items
ARMBAND PINK RESTRICT EXTREMIT (MISCELLANEOUS) ×1 IMPLANT
BAG COUNTER SPONGE SURGICOUNT (BAG) ×1 IMPLANT
BNDG ELASTIC 6INX 5YD STR LF (GAUZE/BANDAGES/DRESSINGS) IMPLANT
CANISTER SUCTION 3000ML PPV (SUCTIONS) ×1 IMPLANT
CLIP TI MEDIUM 6 (CLIP) ×1 IMPLANT
CLIP TI WIDE RED SMALL 6 (CLIP) ×1 IMPLANT
COVER PROBE W GEL 5X96 (DRAPES) ×1 IMPLANT
DERMABOND ADVANCED .7 DNX12 (GAUZE/BANDAGES/DRESSINGS) ×1 IMPLANT
ELECTRODE REM PT RTRN 9FT ADLT (ELECTROSURGICAL) ×1 IMPLANT
GLOVE BIOGEL PI IND STRL 8 (GLOVE) ×1 IMPLANT
GOWN STRL REUS W/ TWL LRG LVL3 (GOWN DISPOSABLE) ×2 IMPLANT
GOWN STRL REUS W/TWL 2XL LVL3 (GOWN DISPOSABLE) ×2 IMPLANT
HEMOSTAT SNOW SURGICEL 2X4 (HEMOSTASIS) IMPLANT
KIT BASIN OR (CUSTOM PROCEDURE TRAY) ×1 IMPLANT
KIT TURNOVER KIT B (KITS) ×1 IMPLANT
PACK CV ACCESS (CUSTOM PROCEDURE TRAY) ×1 IMPLANT
PAD ARMBOARD POSITIONER FOAM (MISCELLANEOUS) ×2 IMPLANT
SLING ARM FOAM STRAP LRG (SOFTGOODS) IMPLANT
SOLN 0.9% NACL POUR BTL 1000ML (IV SOLUTION) ×1 IMPLANT
SOLN STERILE WATER BTL 1000 ML (IV SOLUTION) ×1 IMPLANT
SUT MNCRL AB 4-0 PS2 18 (SUTURE) ×1 IMPLANT
SUT PROLENE 6 0 BV (SUTURE) ×1 IMPLANT
SUT PROLENE 7 0 BV 1 (SUTURE) ×1 IMPLANT
SUT SILK 2 0 SH (SUTURE) IMPLANT
SUT SILK 2 0 SH CR/8 (SUTURE) ×1 IMPLANT
SUT SILK 3-0 18XBRD TIE 12 (SUTURE) IMPLANT
SUT VIC AB 3-0 SH 27X BRD (SUTURE) ×2 IMPLANT
TOWEL GREEN STERILE (TOWEL DISPOSABLE) ×1 IMPLANT
UNDERPAD 30X36 HEAVY ABSORB (UNDERPADS AND DIAPERS) ×1 IMPLANT

## 2024-01-26 NOTE — Op Note (Signed)
" ° ° °  NAME: Karen Myers    MRN: [id number] DOB: 1946/01/28    DATE OF OPERATION: 01/26/2024  PREOP DIAGNOSIS:    End-stage renal disease requiring dialysis  POSTOP DIAGNOSIS:    Same  PROCEDURE:    Left arm brachiobasilic fistula revision, transposition  SURGEON: [name]  ASSIST: Sherrilee Holster, PA  ANESTHESIA: General  EBL: 50 mL  INDICATIONS:    Karen Myers is a 78 y.o. female with prior history of left arm brachiobasilic fistula creation.  She presents today for transposition.  Denies symptoms of steal syndrome.  After discussing the risks and benefits, [name] elected to proceed.  FINDINGS:   Left arm brachiobasilic fistula 8 mm  TECHNIQUE:   Patient was brought to the OR and laid in supine position.  Moderate anesthesia was induced. The patient was prepped and draped in standard fashion.  Lidocaine  was brought to the field and a local block was performed.   The case began with left arm ultrasound fistula mapping.  Multiple branches were noted and marked.  two longitudinal skip incisions were made along the course of the basilic vein with 5 cm bridges.  This was carried through the subcutaneous fat to the brachiobasilic fistula.  The fistula was mobilized and multiple branches ligated using 2-0 silk and clips.  Once mobilized a gore tunneler was brought on to the field, and a subcutaneous tunnel was made along the medial aspect of the bicep. Next, the patient as heparinized, fistula marked, clamped and transected. The vein was pulled through the tunnel tract and reanastomosed using 6.0 prolene x2 suture in running fashion. The wound bed was irrigated with saline, hemostasis achieved with suture and cautery. The wounds were closed with layers of vicryl suture with monocryl and dermabond at the skin. There was a palpable thrill in the fistula at case completion with excellent pulse in the wrist.     Given the complexity of the case,  the assistant was  necessary in order to expedient the procedure and safely perform the technical aspects of the operation.  The assistant provided traction and countertraction to assist with exposure of the artery and vein.  They also assisted with suture ligation of multiple venous branches.  They played a critical role in the anastomosis. These skills, especially following the Prolene suture for the anastomosis, could not have been adequately performed by a scrub tech assistant.   [name], MD [facility] DATE OF DICTATION:   01/26/2024  "

## 2024-01-26 NOTE — Anesthesia Postprocedure Evaluation (Signed)
"   Anesthesia Post Note  Patient: Karen Myers  Procedure(s) Performed: LEFT UPPER EXTREMITY BASILIC VEIN TRANSPOSITION (Left: Arm Upper)     Patient location during evaluation: PACU Anesthesia Type: General Level of consciousness: awake and alert Pain management: pain level controlled Vital Signs Assessment: post-procedure vital signs reviewed and stable Respiratory status: spontaneous breathing, nonlabored ventilation, respiratory function stable and patient connected to nasal cannula oxygen Cardiovascular status: blood pressure returned to baseline and stable Postop Assessment: no apparent nausea or vomiting Anesthetic complications: no   There were no known notable events for this encounter.  Last Vitals:  Vitals:   01/26/24 0945 01/26/24 1000  BP: 126/71 119/68  Pulse: 83 83  Resp: 15 13  Temp:  36.6 C  SpO2: 95% 95%    Last Pain:  Vitals:   01/26/24 1000  TempSrc:   PainSc: 0-No pain                 Danahi Reddish      "

## 2024-01-26 NOTE — Anesthesia Procedure Notes (Addendum)
 Procedure Name: Intubation Date/Time: 01/26/2024 7:49 AM  Performed by: Christopher Comings, CRNAPre-anesthesia Checklist: Patient identified, Emergency Drugs available, Suction available and Patient being monitored Patient Re-evaluated:Patient Re-evaluated prior to induction Oxygen Delivery Method: Circle system utilized Preoxygenation: Pre-oxygenation with 100% oxygen Induction Type: IV induction Ventilation: Mask ventilation without difficulty Laryngoscope Size: Mac and 4 Grade View: Grade II Tube type: Oral Tube size: 7.0 mm Number of attempts: 1 Airway Equipment and Method: Stylet and Oral airway Placement Confirmation: ETT inserted through vocal cords under direct vision, positive ETCO2 and breath sounds checked- equal and bilateral Secured at: 24 cm Tube secured with: Tape Dental Injury: Teeth and Oropharynx as per pre-operative assessment  Comments: LMA attempt unsuccessful.

## 2024-01-26 NOTE — Discharge Instructions (Signed)
"  [facility]  Discharge Instructions  AV Fistula or Graft Surgery for Dialysis Access  Please refer to the following instructions for your post-procedure care. Your surgeon or physician assistant will discuss any changes with you.  Activity  You may drive the day following your surgery, if you are comfortable and no longer taking prescription pain medication. Resume full activity as the soreness in your incision resolves.  Bathing/Showering  You may shower after you go home. Keep your incision dry for 48 hours. Do not soak in a bathtub, hot tub, or swim until the incision heals completely. You may not shower if you have a hemodialysis catheter.  Incision Care  Clean your incision with mild soap and water after 48 hours. Pat the area dry with a clean towel. You do not need a bandage unless otherwise instructed. Do not apply any ointments or creams to your incision. You may have skin glue on your incision. Do not peel it off. It will come off on its own in about one week. Your arm may swell a bit after surgery. To reduce swelling use pillows to elevate your arm so it is above your heart. Your doctor will tell you if you need to lightly wrap your arm with an ACE bandage.  Diet  Resume your normal diet. There are not special food restrictions following this procedure. In order to heal from your surgery, it is CRITICAL to get adequate nutrition. Your body requires vitamins, minerals, and protein. Vegetables are the best source of vitamins and minerals. Vegetables also provide the perfect balance of protein. Processed food has little nutritional value, so try to avoid this.  Medications  Resume taking all of your medications. If your incision is causing pain, you may take over-the counter pain relievers such as acetaminophen  (Tylenol ). If you were prescribed a stronger pain medication, please be aware these medications can cause nausea and constipation. Prevent  nausea by taking the medication with a snack or meal. Avoid constipation by drinking plenty of fluids and eating foods with high amount of fiber, such as fruits, vegetables, and grains.  Do not take Tylenol  if you are taking prescription pain medications.  Follow up Your surgeon may want to see you in the office following your access surgery. If so, this will be arranged at the time of your surgery.  Please call us  immediately for any of the following conditions:  Increased pain, redness, drainage (pus) from your incision site Fever of 101 degrees or higher Severe or worsening pain at your incision site Hand pain or numbness.  Reduce your risk of vascular disease:  Stop smoking. If you would like help, call [facility] at 1-800-QUIT-NOW (872-197-5436) or  at 406-566-3036  Manage your cholesterol Maintain a desired weight Control your diabetes Keep your blood pressure down  Dialysis  It will take several weeks to several months for your new dialysis access to be ready for use. Your surgeon will determine when it is okay to use it. Your nephrologist will continue to direct your dialysis. You can continue to use your Permcath until your new access is ready for use.   01/26/2024 Karen Myers [id number] 1945-11-13  Surgeon(s): Karen Fonda BRAVO, MD  Procedures: LEFT UPPER EXTREMITY BASILIC VEIN TRANSPOSITION   May stick graft immediately   May stick graft on designated area only:   x Do not stick fistula for 4 weeks    If you have any questions, please call the office at  [phone].  °"

## 2024-01-26 NOTE — Transfer of Care (Signed)
 Immediate Anesthesia Transfer of Care Note  Patient: Karen Myers  Procedure(s) Performed: LEFT UPPER EXTREMITY BASILIC VEIN TRANSPOSITION (Left: Arm Upper)  Patient Location: PACU  Anesthesia Type:General  Level of Consciousness: drowsy  Airway & Oxygen Therapy: Patient connected to face mask oxygen  Post-op Assessment: Report given to RN and Post -op Vital signs reviewed and stable  Post vital signs: Reviewed and stable  Last Vitals:  Vitals Value Taken Time  BP 150/77 01/26/24 09:30  Temp    Pulse 84 01/26/24 09:32  Resp 17 01/26/24 09:32  SpO2 100 % 01/26/24 09:32  Vitals shown include unfiled device data.  Last Pain:  Vitals:   01/26/24 0627  TempSrc:   PainSc: 0-No pain         Complications: There were no known notable events for this encounter.

## 2024-01-26 NOTE — H&P (Signed)
 " POST OPERATIVE OFFICE NOTE  Patient seen and examined in preop holding.  No complaints. No changes to medication history or physical exam since last seen in clinic. After discussing the risks and benefits of left arm fistula superficialization, [name] elected to proceed.   [name] MD   CC:  F/u for surgery  HPI:  Karen Myers is a 78 y.o. female who returns today for postop visit.  She recently underwent creation of a left arm brachiobasilic fistula on 11/14/2023 by Dr. Rim.  She returns today for follow-up.  She has no complaints at today's office visit.  She denies any issues with her left arm incisions such as drainage, dehiscence, or tenderness.  She denies any symptoms of steal in the left hand such as weakness, numbness, excessive coldness, or pain.  She currently dialyzes via right IJ TDC.   Allergies  Allergen Reactions   Altace [Ramipril] Swelling    Swelling of lips, hypotension   Lisinopril     Other reaction(s): angioedema   Losartan  Other (See Comments)    Current Facility-Administered Medications  Medication Dose Route Frequency Provider Last Rate Last Admin   0.9 %  sodium chloride  infusion   Intravenous Continuous Eliav Mechling E, MD       ceFAZolin  (ANCEF ) 2-4 GM/100ML-% IVPB            ceFAZolin  (ANCEF ) IVPB 2g/100 mL premix  2 g Intravenous 30 min Pre-Op  Eddith Mentor E, MD       chlorhexidine  (HIBICLENS ) 4 % liquid 4 Application  60 mL Topical Once Siya Flurry E, MD       And   [START ON 01/27/2024] chlorhexidine  (HIBICLENS ) 4 % liquid 4 Application  60 mL Topical Once Suesan Mohrmann E, MD       lactated ringers  infusion   Intravenous Continuous Mallory Manus, MD         ROS:  See HPI  Physical Exam:  Incision: Left AC fossa incision well-healed without signs of infection or hematoma Extremities: Palpable left radial pulse.  Left brachiobasilic fistula with great thrill on exam Neuro: Intact motor and sensation of  LUE  Studies: Dialysis Duplex (12/14/2023) +--------------------+----------+-----------------+--------+  AVF                PSV (cm/s)Flow Vol (mL/min)Comments  +--------------------+----------+-----------------+--------+  Native artery inflow   284          3475                 +--------------------+----------+-----------------+--------+  AVF Anastomosis        716                               +--------------------+----------+-----------------+--------+     +------------+---------+-------------+---------+---------------------------  ---+  OUTFLOW VEIN   PSV   Diameter (cm)  Depth             Describe                           (cm/s)                 (cm)                                    +------------+---------+-------------+---------+---------------------------  ---+  Prox UA        152  0.89       0.57                                    +------------+---------+-------------+---------+---------------------------  ---+  Mid UA         121       0.84       0.48     competing branch  measuring                                                           0.2cm                +------------+---------+-------------+---------+---------------------------  ---+  Dist UA        134       0.64       0.44     competing branch  measuring                                                           0.5cm                +------------+---------+-------------+---------+---------------------------  ---+  AC Fossa       369       0.64       0.21                                    +------------+---------+-------------+---------+-------     Assessment/Plan:  This is a 78 y.o. female who is here for postop visit  - The patient recently underwent creation of a left brachiobasilic AV fistula for permanent dialysis access.  Her left arm incision is well-healed without signs of infection or hematoma -Duplex demonstrates a well matured  fistula with diameters greater than 6 mm and flow volume of 3475 mL/min - She denies any symptoms of steal in the left hand such as weakness, numbness, excessive coldness, or pain.  She has a palpable left radial pulse.  She has intact motor and sensation in the left hand -On exam her left brachiobasilic fistula has a very strong thrill throughout the upper arm - She will continue dialysis through her right IJ TDC.  We will plan for left brachiobasilic fistula transposition and superficialization within the next couple of weeks with Dr. Lanis on a nondialysis day. She is agreeable    Ahmed Holster, PA-C [facility] [phone]   Clinic MD:  Lanis  "

## 2024-01-27 ENCOUNTER — Encounter (HOSPITAL_COMMUNITY): Payer: Self-pay | Admitting: Vascular Surgery

## 2024-02-18 NOTE — Progress Notes (Unsigned)
 " Cardiology Office Note:    Date:  02/20/2024   ID:  Karen Myers, DOB 1945-03-30, MRN [id number]  PCP:  Rexanne Ingle, MD  Cardiologist:  None  Electrophysiologist:  None   Referring MD: Rexanne Ingle, MD   Chief complaint: Hypertension  History of Present Illness:    Karen Myers is a 79 y.o. female with a hx of breast cancer, CKD stage IV-V, hypertension, hyperparathyroidism, hyperlipidemia who presents for follow-up.  She was referred by Dr. Rexanne for evaluation of chest pain, initially seen on 07/17/2019.   She reports that she does not exercise, most exertion she does is walking up and down stairs.  She denies any exertional chest pain or dyspnea.  Denies any lightheadedness, syncope, palpitations, or lower extremity edema.  She quit smoking in 2002.  Family history includes maternal grandmother died of MI in 78s.  Lexiscan  Myoview  on 08/08/2019 showed normal perfusion and EF 59%.  Echocardiogram on 08/08/2019 showed normal LV function, mildly reduced RV function, grade 1 diastolic dysfunction, no significant valvular disease.  Since last clinic visit, she reports she is doing well.  Denies any chest pain, dyspnea,  lower extremity edema, or palpitations.  Reports some lightheadedness after dialysis, denies any syncope.  Reports has not been exercising.    Past Medical History:  Diagnosis Date   Arthritis    Breast cancer (HCC)    left breast cancer    Breast cancer (HCC)    Chronic kidney disease    CIN III (cervical intraepithelial neoplasia III)    prior to LEEP procedure   GERD (gastroesophageal reflux disease)    High cholesterol    Hypertension     Past Surgical History:  Procedure Laterality Date   AV FISTULA PLACEMENT Left 11/14/2023   Procedure: Brachial Basilic  FISTULA CREATION;  Surgeon: [name], MD;  Location: [facility] OR;  Service: Vascular;  Laterality: Left;   BASCILIC VEIN TRANSPOSITION Left 01/26/2024   Procedure: LEFT UPPER EXTREMITY  BASILIC VEIN TRANSPOSITION;  Surgeon: [name], MD;  Location: [facility] OR;  Service: Vascular;  Laterality: Left;   BREAST LUMPECTOMY     CERVICAL BIOPSY  W/ LOOP ELECTRODE EXCISION  2005   CHOLECYSTECTOMY N/A 03/18/2021   Procedure: LAPAROSCOPIC CHOLECYSTECTOMY WITH ICG;  Surgeon: Teresa Lonni HERO, MD;  Location: [facility] OR;  Service: General;  Laterality: N/A;   COLONOSCOPY     HYSTEROSCOPY WITH D & C  2005   IR TUNNELED CENTRAL VENOUS CATH PLC W IMG  10/05/2023   MASTECTOMY  2002   bilateral    PARATHYROIDECTOMY N/A 06/01/2017   Procedure: PARATHYROIDECTOMY;  Surgeon: Eletha Boas, MD;  Location: [facility] ORS;  Service: General;  Laterality: N/A;   PARATHYROIDECTOMY N/A 12/07/2018   Procedure: NECK EXPLORATION, RIGHT INFERIOR PARATHYROIDECTOMY, LEFT THYROID  LOBECTOMY;  Surgeon: Eletha Boas, MD;  Location: [facility] ORS;  Service: General;  Laterality: N/A;   POLYPECTOMY     RENAL BIOPSY     VULVA /PERINEUM BIOPSY     nevus on vulva     Current Medications: Current Meds  Medication Sig   acetaminophen  (TYLENOL ) 500 MG tablet Take 1,000 mg by mouth 2 (two) times daily.   amLODipine (NORVASC) 5 MG tablet Take 5 mg by mouth daily.   aspirin  EC 81 MG tablet Take 81 mg by mouth daily.   atorvastatin (LIPITOR) 40 MG tablet Take 40 mg by mouth daily.   ferrous sulfate 325 (65 FE) MG tablet Take 325 mg by mouth 3 (  three) times a week.   fluticasone  (FLONASE ) 50 MCG/ACT nasal spray Place 1 spray into both nostrils daily.   JANUVIA 25 MG tablet Take 25 mg by mouth daily.   loratadine (CLARITIN) 10 MG tablet Take 10 mg by mouth at bedtime as needed for allergies.   oxyCODONE  (ROXICODONE ) 5 MG immediate release tablet Take 1 tablet (5 mg total) by mouth every 4 (four) hours as needed for severe pain (pain score 7-10).   polyethylene glycol (MIRALAX / GLYCOLAX) 17 g packet Take 17 g by mouth as needed.   predniSONE  (DELTASONE ) 5 MG tablet Take 5 mg by mouth daily.   sevelamer carbonate (RENVELA) 800 MG  tablet Take 800 mg by mouth 3 (three) times daily with meals.   tacrolimus  (PROGRAF ) 1 MG capsule Take 1 mg by mouth in the morning and at bedtime.   [DISCONTINUED] DILT-XR 240 MG 24 hr capsule Take 240 mg by mouth daily.     Allergies:   Altace [ramipril], Lisinopril, and Losartan    Social History   Socioeconomic History   Marital status: Married    Spouse name: Not on file   Number of children: Not on file   Years of education: Not on file   Highest education level: Not on file  Occupational History   Not on file  Tobacco Use   Smoking status: Former    Current packs/day: 0.00    Types: Cigarettes    Quit date: 2002    Years since quitting: 24.0   Smokeless tobacco: Never  Vaping Use   Vaping status: Never Used  Substance and Sexual Activity   Alcohol use: No   Drug use: No   Sexual activity: Yes  Other Topics Concern   Not on file  Social History Narrative   Not on file   Social Drivers of Health   Tobacco Use: Medium Risk (01/26/2024)   Patient History    Smoking Tobacco Use: Former    Smokeless Tobacco Use: Never    Passive Exposure: Not on Actuary Strain: Not on file  Food Insecurity: Low Risk (09/27/2023)   Received from [facility]   Epic    Within the past 12 months, you worried that your food would run out before you got money to buy more: Never true    Within the past 12 months, the food you bought just didn't last and you didn't have money to get more. : Never true  Transportation Needs: No Transportation Needs (09/27/2023)   Received from [facility]    In the past 12 months, has lack of reliable transportation kept you from medical appointments, meetings, work or from getting things needed for daily living? : No  Physical Activity: Not on file  Stress: Not on file  Social Connections: Not on file  Depression (EYV7-0): Not on file  Alcohol Screen: Not on file  Housing: Low Risk (09/27/2023)   Received from  [facility]   Epic    What is your living situation today?: I have a steady place to live    Think about the place you live. Do you have problems with any of the following? Choose all that apply:: None/None on this list  Utilities: Low Risk (09/27/2023)   Received from [facility]   Utilities    In the past 12 months has the electric, gas, oil, or water company threatened to shut off services in your home? : No  Health Literacy: Not on file  Family History: Maternal grandmother died of MI in 83s.  ROS:   Please see the history of present illness.     All other systems reviewed and are negative.  EKGs/Labs/Other Studies Reviewed:    The following studies were reviewed today:   EKG:  EKG is ordered today.  The ekg ordered at prior clinic visit demonstrates normal sinus rhythm, rate 86, LVH, no ST changes, Q waves in V1/2  Recent Labs: 09/17/2023: TSH 1.850 10/18/2023: ALT 8; Platelets 246 01/26/2024: BUN 15; Creatinine, Ser 3.20; Hemoglobin 13.3; Potassium 3.4; Sodium 135  Recent Lipid Panel    Component Value Date/Time   CHOL 177 03/18/2021 0436   CHOL 202 (H) 03/04/2020 0936   TRIG 161 (H) 03/18/2021 0436   HDL 45 03/18/2021 0436   HDL 39 (L) 03/04/2020 0936   CHOLHDL 3.9 03/18/2021 0436   VLDL 32 03/18/2021 0436   LDLCALC 100 (H) 03/18/2021 0436   LDLCALC 119 (H) 03/04/2020 0936    Physical Exam:    VS:  BP 110/60 (BP Location: Right Arm, Patient Position: Sitting, Cuff Size: Normal)   Pulse 63   Ht 5' 7 (1.702 m)   Wt 109 lb (49.4 kg)   BMI 17.07 kg/m     Wt Readings from Last 3 Encounters:  02/20/24 109 lb (49.4 kg)  01/26/24 118 lb (53.5 kg)  11/14/23 249 lb 1.9 oz (113 kg)     GEN: Well nourished, well developed in no acute distress HEENT: Normal NECK: No JVD; No carotid bruits CARDIAC: RRR, 2 out of 6 systolic murmur RESPIRATORY:  Clear to auscultation without rales, wheezing or rhonchi  ABDOMEN: Soft, non-tender,  non-distended MUSCULOSKELETAL:  No edema SKIN: Warm and dry NEUROLOGIC:  Alert and oriented x 3 PSYCHIATRIC:  Normal affect   ASSESSMENT:    1. Murmur   2. Elevated cholesterol   3. Therapeutic drug monitoring   4. Chest pain of uncertain etiology   5. Essential hypertension     PLAN:    Chest pain: Atypical in description but does have significant CAD risk factors (hypertension, hyperlipidemia, age, former tobacco use).  Lexiscan  Myoview  on 08/08/2019 showed normal perfusion and EF 59%.  Echocardiogram on 08/08/2019 showed normal LV function, mildly reduced RV function, grade 1 diastolic dysfunction, no significant valvular disease.   - Reports no recent chest pain.  No further cardiac work-up recommended.  Murmur: 2 out of 6 systolic murmur, prior echo in 2021 with aortic sclerosis.  Will update echocardiogram  ESRD: underwent kidney transplant in 2023 but now on HD  Hypertension: On amlodipine 5 mg daily.  Appears controlled  Hyperlipidemia: On atorvastatin 40 mg daily.  LDL 94 on 05/25/2023.  CT chest 07/2023 noted heavy coronary calcifications.  Goal LDL less than 70, will update lipid panel  RTC in 6 months   Medication Adjustments/Labs and Tests Ordered: Current medicines are reviewed at length with the patient today.  Concerns regarding medicines are outlined above.  Orders Placed This Encounter  Procedures   Lipid panel   ECHOCARDIOGRAM COMPLETE   No orders of the defined types were placed in this encounter.   Patient Instructions  Medication Instructions:  Your physician recommends that you continue on your current medications as directed. Please refer to the Current Medication list given to you today.  *If you need a refill on your cardiac medications before your next appointment, please call your pharmacy*  Lab Work: LIPID PANEL TODAY   If you have labs (blood work) drawn today  and your tests are completely normal, you will receive your results only by: MyChart  Message (if you have MyChart) OR A paper copy in the mail If you have any lab test that is abnormal or we need to change your treatment, we will call you to review the results.  Testing/Procedures: Your physician has requested that you have an echocardiogram. Echocardiography is a painless test that uses sound waves to create images of your heart. It provides your doctor with information about the size and shape of your heart and how well your hearts chambers and valves are working. This procedure takes approximately one hour. There are no restrictions for this procedure. Please do NOT wear cologne, perfume, aftershave, or lotions (deodorant is allowed). Please arrive 15 minutes prior to your appointment time.  Please note: We ask at that you not bring children with you during ultrasound (echo/ vascular) testing. Due to room size and safety concerns, children are not allowed in the ultrasound rooms during exams. Our front office staff cannot provide observation of children in our lobby area while testing is being conducted. An adult accompanying a patient to their appointment will only be allowed in the ultrasound room at the discretion of the ultrasound technician under special circumstances. We apologize for any inconvenience.  Follow-Up: At [facility], you and your health needs are our priority.  As part of our continuing mission to provide you with exceptional heart care, our providers are all part of one team.  This team includes your primary Cardiologist (physician) and Advanced Practice Providers or APPs (Physician Assistants and Nurse Practitioners) who all work together to provide you with the care you need, when you need it.  Your next appointment:   6 month(s)  Provider:   DR KATE   We recommend signing up for the patient portal called MyChart.  Sign up information is provided on this After Visit Summary.  MyChart is used to connect with patients for Virtual Visits  (Telemedicine).  Patients are able to view lab/test results, encounter notes, upcoming appointments, etc.  Non-urgent messages can be sent to your provider as well.   To learn more about what you can do with MyChart, go to [email or web address].   Other Instructions            Signed, Lonni LITTIE Kate, MD  02/20/2024 8:55 AM    [facility] "

## 2024-02-20 ENCOUNTER — Ambulatory Visit: Attending: Cardiology | Admitting: Cardiology

## 2024-02-20 VITALS — BP 110/60 | HR 63 | Ht 67.0 in | Wt 109.0 lb

## 2024-02-20 DIAGNOSIS — I10 Essential (primary) hypertension: Secondary | ICD-10-CM | POA: Diagnosis not present

## 2024-02-20 DIAGNOSIS — R079 Chest pain, unspecified: Secondary | ICD-10-CM | POA: Diagnosis not present

## 2024-02-20 DIAGNOSIS — R011 Cardiac murmur, unspecified: Secondary | ICD-10-CM

## 2024-02-20 DIAGNOSIS — Z5181 Encounter for therapeutic drug level monitoring: Secondary | ICD-10-CM

## 2024-02-20 DIAGNOSIS — E7800 Pure hypercholesterolemia, unspecified: Secondary | ICD-10-CM | POA: Diagnosis not present

## 2024-02-20 LAB — LIPID PANEL
Chol/HDL Ratio: 1.6 ratio (ref 0.0–4.4)
Cholesterol, Total: 150 mg/dL (ref 100–199)
HDL: 91 mg/dL
LDL Chol Calc (NIH): 43 mg/dL (ref 0–99)
Triglycerides: 84 mg/dL (ref 0–149)
VLDL Cholesterol Cal: 16 mg/dL (ref 5–40)

## 2024-02-20 NOTE — Patient Instructions (Signed)
 Medication Instructions:  Your physician recommends that you continue on your current medications as directed. Please refer to the Current Medication list given to you today.  *If you need a refill on your cardiac medications before your next appointment, please call your pharmacy*  Lab Work: LIPID PANEL TODAY   If you have labs (blood work) drawn today and your tests are completely normal, you will receive your results only by: MyChart Message (if you have MyChart) OR A paper copy in the mail If you have any lab test that is abnormal or we need to change your treatment, we will call you to review the results.  Testing/Procedures: Your physician has requested that you have an echocardiogram. Echocardiography is a painless test that uses sound waves to create images of your heart. It provides your doctor with information about the size and shape of your heart and how well your hearts chambers and valves are working. This procedure takes approximately one hour. There are no restrictions for this procedure. Please do NOT wear cologne, perfume, aftershave, or lotions (deodorant is allowed). Please arrive 15 minutes prior to your appointment time.  Please note: We ask at that you not bring children with you during ultrasound (echo/ vascular) testing. Due to room size and safety concerns, children are not allowed in the ultrasound rooms during exams. Our front office staff cannot provide observation of children in our lobby area while testing is being conducted. An adult accompanying a patient to their appointment will only be allowed in the ultrasound room at the discretion of the ultrasound technician under special circumstances. We apologize for any inconvenience.  Follow-Up: At [facility], you and your health needs are our priority.  As part of our continuing mission to provide you with exceptional heart care, our providers are all part of one team.  This team includes your primary  Cardiologist (physician) and Advanced Practice Providers or APPs (Physician Assistants and Nurse Practitioners) who all work together to provide you with the care you need, when you need it.  Your next appointment:   6 month(s)  Provider:   DR KATE   We recommend signing up for the patient portal called MyChart.  Sign up information is provided on this After Visit Summary.  MyChart is used to connect with patients for Virtual Visits (Telemedicine).  Patients are able to view lab/test results, encounter notes, upcoming appointments, etc.  Non-urgent messages can be sent to your provider as well.   To learn more about what you can do with MyChart, go to [email or web address].   Other Instructions

## 2024-02-21 ENCOUNTER — Ambulatory Visit: Payer: Self-pay | Admitting: Cardiology

## 2024-02-22 ENCOUNTER — Ambulatory Visit: Attending: Surgery | Admitting: Physician Assistant

## 2024-02-22 VITALS — BP 148/74 | HR 82 | Temp 98.0°F | Wt 110.2 lb

## 2024-02-22 DIAGNOSIS — N186 End stage renal disease: Secondary | ICD-10-CM

## 2024-02-22 NOTE — Progress Notes (Signed)
" ° ° °  Postoperative Access Visit   History of Present Illness   Karen Myers is a 79 y.o. year old female who presents for postoperative follow-up for:  Left arm brachiobasilic fistula revision, transposition by Dr. Lanis on 01/26/24. The patient's wounds are healing very well.  The patient notes no steal symptoms. She gets coldness in her left hand/fingers at times. She wears a glove to HD which helps. She recently experienced blackening of her 2nd-5th finger nails which caused her artificial nails to fall off.  She says she has had artificial nails since 2000 and has never had this issue. She does not have any pain. No issues with the surrounding skin. No ulceration. She currently dialyzes via right internal jugular TDC on MWF. Her initial fistula was created on 11/14/23.  She unfortunately lost her husband of 55 years on 12/28/23. She says he was 77 years old and a [employer] cytogeneticist. She says she has many fond memories of their years together. She says once she is able to she is looking forward to traveling to [place]  to visit her son and grandson.  Physical Examination   Vitals:   02/22/24 1412  BP: (!) 148/74  Pulse: 82  Temp: 98 F (36.7 C)  TempSrc: Temporal  Weight: 110 lb 3.2 oz (50 kg)   Body mass index is 17.26 kg/m.  left arm Incisions are healing very well, 2+ radial pulse, hand grip is 5/5, sensation in digits is intact, palpable thrill, bruit can be auscultated. Brisk palmar arch signals and digital doppler signals     Medical Decision Making   Karen Myers is a 79 y.o. year old female who presents s/p Left arm brachiobasilic fistula revision, transposition by Dr. Lanis on 01/26/24. Her incisions are healing very well. She is without any steal symptoms. She gets a little coldness in her left fingers at times but this is managed with wearing glove/keeping hand warm. She has had some issues with her nail beds turning black. She has great flow into her hand. This  could be from perfusion but more likely fungal issue. Advised her to let her nails heal and she can re attempt application of artifical nails to see if she has any on going issues The patient's access will be ready for use after 03/16/24 The patient's tunneled dialysis catheter can be removed when Nephrology is comfortable with the performance of the left brachiobasilic AV fistula The patient may follow up on a prn basis   Teretha Damme, PA-C [facility] Office: [phone]  Clinic MD: Lanis "

## 2024-02-29 ENCOUNTER — Ambulatory Visit (HOSPITAL_COMMUNITY)
Admission: EM | Admit: 2024-02-29 | Discharge: 2024-02-29 | Disposition: A | Discharge status: Home / Self Care | Attending: Emergency Medicine | Admitting: Emergency Medicine

## 2024-02-29 ENCOUNTER — Encounter (HOSPITAL_COMMUNITY): Payer: Self-pay

## 2024-02-29 ENCOUNTER — Ambulatory Visit (HOSPITAL_COMMUNITY)
Admission: RE | Admit: 2024-02-29 | Discharge: 2024-02-29 | Disposition: A | Discharge status: Home / Self Care | Source: Ambulatory Visit | Attending: Cardiology | Admitting: Cardiology

## 2024-02-29 DIAGNOSIS — R011 Cardiac murmur, unspecified: Secondary | ICD-10-CM | POA: Diagnosis present

## 2024-02-29 DIAGNOSIS — M25551 Pain in right hip: Secondary | ICD-10-CM

## 2024-02-29 MED ORDER — DEXAMETHASONE SOD PHOSPHATE PF 10 MG/ML IJ SOLN
INTRAMUSCULAR | Status: AC
  Filled 2024-02-29: qty 1

## 2024-02-29 MED ORDER — DEXAMETHASONE SOD PHOSPHATE PF 10 MG/ML IJ SOLN
10.0000 mg | Freq: Once | INTRAMUSCULAR | Status: AC
  Administered 2024-02-29: 10 mg via INTRAMUSCULAR

## 2024-02-29 NOTE — Discharge Instructions (Signed)
 We have given you a steroid injection here in clinic to help with inflammation  Continue with tylenol  as needed  Follow-up with an orthopedic if symptoms persist   Return to clinic for new or urgent symptoms

## 2024-02-29 NOTE — ED Provider Notes (Signed)
 " [facility]    CSN: [id number] Arrival date & time: 02/29/24  0915      History   Chief Complaint Chief Complaint  Patient presents with   Leg Pain    HPI Karen Myers is a 79 y.o. female.   Patient presents to clinic over concern of right hip pain that radiates down her right leg.   Yesterday had pain in the right hip area that runs down her leg  Patient decided to come into clinic today after the pain kept her up all night She does have a history of arthritis and bursitis in the left hip so she has been compensating and using her right leg more Has tried Tylenol , Biofreeze and lidocaine  without any improvement to the pain Denies injuries or falls, denies rashes to the area, denies back pain  Patient with a history of end-stage renal disease on dialysis, kidney transplant, hypertension, hyperlipidemia and breast cancer.   She currently dialyzes via right internal jugular TDC on MWF.   The history is provided by the patient and medical records.  Leg Pain   Past Medical History:  Diagnosis Date   Arthritis    Breast cancer (HCC)    left breast cancer    Breast cancer (HCC)    Chronic kidney disease    CIN III (cervical intraepithelial neoplasia III)    prior to LEEP procedure   GERD (gastroesophageal reflux disease)    High cholesterol    Hypertension     Patient Active Problem List   Diagnosis Date Noted   Arthralgia of left lower leg 08/03/2020   Primary hyperparathyroidism 12/07/2018   Hyperparathyroidism, primary 05/27/2017   CIN III (cervical intraepithelial neoplasia III) 10/06/2011   Breast CA (HCC) 12/02/2010   Renal insufficiency 12/02/2010   Hypertension 12/02/2010   Hyperlipidemia 12/02/2010    Past Surgical History:  Procedure Laterality Date   AV FISTULA PLACEMENT Left 11/14/2023   Procedure: Brachial Basilic  FISTULA CREATION;  Surgeon: [name], MD;  Location: [facility] OR;  Service: Vascular;  Laterality: Left;    BASCILIC VEIN TRANSPOSITION Left 01/26/2024   Procedure: LEFT UPPER EXTREMITY BASILIC VEIN TRANSPOSITION;  Surgeon: [name], MD;  Location: [facility] OR;  Service: Vascular;  Laterality: Left;   BREAST LUMPECTOMY     CERVICAL BIOPSY  W/ LOOP ELECTRODE EXCISION  2005   CHOLECYSTECTOMY N/A 03/18/2021   Procedure: LAPAROSCOPIC CHOLECYSTECTOMY WITH ICG;  Surgeon: Teresa Lonni HERO, MD;  Location: [facility] OR;  Service: General;  Laterality: N/A;   COLONOSCOPY     HYSTEROSCOPY WITH D & C  2005   IR TUNNELED CENTRAL VENOUS CATH PLC W IMG  10/05/2023   MASTECTOMY  2002   bilateral    PARATHYROIDECTOMY N/A 06/01/2017   Procedure: PARATHYROIDECTOMY;  Surgeon: Eletha Boas, MD;  Location: [facility] ORS;  Service: General;  Laterality: N/A;   PARATHYROIDECTOMY N/A 12/07/2018   Procedure: NECK EXPLORATION, RIGHT INFERIOR PARATHYROIDECTOMY, LEFT THYROID  LOBECTOMY;  Surgeon: Eletha Boas, MD;  Location: [facility];  Service: General;  Laterality: N/A;   POLYPECTOMY     RENAL BIOPSY     VULVA MARYBETH BIOPSY     nevus on vulva     OB History     Gravida  2   Para  2   Term      Preterm      AB      Living  2      SAB      IAB  Ectopic      Multiple      Live Births               Home Medications    Prior to Admission medications  Medication Sig Start Date End Date Taking? Authorizing Provider  acetaminophen  (TYLENOL ) 500 MG tablet Take 1,000 mg by mouth 2 (two) times daily.    [provider]  amLODipine (NORVASC) 5 MG tablet Take 5 mg by mouth daily. 08/30/21   [provider]  aspirin  EC 81 MG tablet Take 81 mg by mouth daily.    [provider]  atorvastatin (LIPITOR) 40 MG tablet Take 40 mg by mouth daily.    [provider]  ferrous sulfate 325 (65 FE) MG tablet Take 325 mg by mouth 3 (three) times a week.    [provider]  fluticasone  (FLONASE ) 50 MCG/ACT nasal spray Place 1 spray into both nostrils daily. 04/26/23   Chandra Harlene LABOR, NP  JANUVIA 25 MG tablet Take 25 mg by mouth daily.    [provider]  loratadine (CLARITIN) 10 MG tablet Take 10 mg by mouth at bedtime as needed for allergies.    [provider]  oxyCODONE  (ROXICODONE ) 5 MG immediate release tablet Take 1 tablet (5 mg total) by mouth every 4 (four) hours as needed for severe pain (pain score 7-10). 01/26/24   Schuh, McKenzi P, PA-C  polyethylene glycol (MIRALAX / GLYCOLAX) 17 g packet Take 17 g by mouth as needed. 08/04/21   [provider]  predniSONE  (DELTASONE ) 5 MG tablet Take 5 mg by mouth daily. 08/31/21   [provider]  sevelamer carbonate (RENVELA) 800 MG tablet Take 800 mg by mouth 3 (three) times daily with meals.    [provider]  tacrolimus  (PROGRAF ) 1 MG capsule Take 1 mg by mouth in the morning and at bedtime. 08/23/21   [provider]  losartan  (COZAAR ) 100 MG tablet Take 100 mg by mouth daily.  03/07/20  [provider]    Family History History reviewed. No pertinent family history.  Social History Social History[1]   Allergies   Altace [ramipril], Lisinopril, and Losartan    Review of Systems Review of Systems  Per HPI  Physical Exam Triage Vital Signs ED Triage Vitals [02/29/24 0955]  Encounter Vitals Group     BP 118/73     Girls Systolic BP Percentile      Girls Diastolic BP Percentile      Boys Systolic BP Percentile      Boys Diastolic BP Percentile      Pulse Rate 81     Resp 18     Temp 98.2 F (36.8 C)     Temp Source Oral     SpO2 96 %     Weight      Height      Head Circumference      Peak Flow      Pain Score 10     Pain Loc      Pain Education      Exclude from Growth Chart    No data found.  Updated Vital Signs BP 118/73 (BP Location: Left Arm)   Pulse 81   Temp 98.2 F (36.8 C) (Oral)   Resp 18   SpO2 96%   Visual Acuity Right Eye Distance:   Left Eye Distance:   Bilateral Distance:    Right Eye Near:   Left  Eye Near:  Bilateral Near:     Physical Exam Vitals and nursing note reviewed.  Constitutional:      Appearance: Normal appearance.      UC Treatments / Results  Labs (all labs ordered are listed, but only abnormal results are displayed) Labs Reviewed - No data to display  EKG   Radiology No results found.  Procedures Procedures (including critical care time)  Medications Ordered in UC Medications  dexamethasone  (DECADRON ) injection 10 mg (has no administration in time range)    Initial Impression / Assessment and Plan / UC Course  I have reviewed the triage vital signs and the nursing notes.  Pertinent labs & imaging results that were available during my care of the patient were reviewed by me and considered in my medical decision making (see chart for details).  Vitals and triage reviewed, patient is hemodynamically stable.  Atraumatic, imaging deferred.  Skin without rashes.  Pain is not reducible to palpation or with range of motion.  History of arthritis in left hip, suspect arthritis in right.  Encouraged continuation of Tylenol  and orthopedic follow-up offered physical therapy versus further intervention.  Will trial IM Decadron  for inflammation.  Plan of care, follow-up care, and return precautions given, no questions at this time.    Final Clinical Impressions(s) / UC Diagnoses   Final diagnoses:  Right hip pain     Discharge Instructions      We have given you a steroid injection here in clinic to help with inflammation  Continue with tylenol  as needed  Follow-up with an orthopedic if symptoms persist   Return to clinic for new or urgent symptoms      ED Prescriptions   None    PDMP not reviewed this encounter.     [1]  Social History Tobacco Use   Smoking status: Former    Current packs/day: 0.00    Types: Cigarettes    Quit date: 2002    Years since quitting: 24.0   Smokeless tobacco: Never  Vaping Use   Vaping status:  Never Used  Substance Use Topics   Alcohol use: No   Drug use: No     Ball, Courvoisier Hamblen  G, FNP 02/29/24 1031  "

## 2024-02-29 NOTE — ED Triage Notes (Signed)
 Pt c/o rt lower back pain down leg since yesterday. Denies injury. States hx of bursitis and arthritis. States took tylenol  and used bio freeze with no relief.

## 2024-03-01 LAB — ECHOCARDIOGRAM COMPLETE
AR max vel: 1.27 cm2
AV Area VTI: 1.13 cm2
AV Area mean vel: 1.17 cm2
AV Mean grad: 21 mmHg
AV Peak grad: 29.2 mmHg
Ao pk vel: 2.7 m/s
Area-P 1/2: 6.96 cm2
P 1/2 time: 370 ms
S' Lateral: 2.4 cm
